# Patient Record
Sex: Female | Born: 1940
Health system: Southern US, Community
[De-identification: ages and names within clinical notes are randomized; demographics above are authoritative.]

## PROBLEM LIST (undated history)

## (undated) DIAGNOSIS — Z8709 Personal history of other diseases of the respiratory system: Secondary | ICD-10-CM

## (undated) DIAGNOSIS — M51369 Other intervertebral disc degeneration, lumbar region without mention of lumbar back pain or lower extremity pain: Secondary | ICD-10-CM

## (undated) DIAGNOSIS — M4125 Other idiopathic scoliosis, thoracolumbar region: Secondary | ICD-10-CM

## (undated) DIAGNOSIS — K219 Gastro-esophageal reflux disease without esophagitis: Secondary | ICD-10-CM

## (undated) DIAGNOSIS — M810 Age-related osteoporosis without current pathological fracture: Secondary | ICD-10-CM

## (undated) DIAGNOSIS — E785 Hyperlipidemia, unspecified: Secondary | ICD-10-CM

## (undated) DIAGNOSIS — M869 Osteomyelitis, unspecified: Secondary | ICD-10-CM

## (undated) DIAGNOSIS — I10 Essential (primary) hypertension: Secondary | ICD-10-CM

## (undated) DIAGNOSIS — M5136 Other intervertebral disc degeneration, lumbar region: Secondary | ICD-10-CM

## (undated) HISTORY — DX: Essential (primary) hypertension: I10

## (undated) HISTORY — DX: Other intervertebral disc degeneration, lumbar region: M51.36

## (undated) HISTORY — PX: ABDOMINAL HYSTERECTOMY: SHX81

## (undated) HISTORY — DX: Osteomyelitis, unspecified: M86.9

## (undated) HISTORY — DX: Other intervertebral disc degeneration, lumbar region without mention of lumbar back pain or lower extremity pain: M51.369

## (undated) HISTORY — DX: Gastro-esophageal reflux disease without esophagitis: K21.9

## (undated) HISTORY — DX: Age-related osteoporosis without current pathological fracture: M81.0

## (undated) HISTORY — DX: Hyperlipidemia, unspecified: E78.5

## (undated) HISTORY — PX: JOINT REPLACEMENT: SHX530

## (undated) HISTORY — PX: CHOLECYSTECTOMY: SHX55

## (undated) HISTORY — DX: Other idiopathic scoliosis, thoracolumbar region: M41.25

---
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
## 1999-05-11 ENCOUNTER — Other Ambulatory Visit: Admission: RE | Admit: 1999-05-11 | Discharge: 1999-05-11 | Payer: Self-pay | Admitting: Obstetrics and Gynecology

## 1999-10-26 ENCOUNTER — Ambulatory Visit (HOSPITAL_COMMUNITY): Admission: RE | Admit: 1999-10-26 | Discharge: 1999-10-26 | Payer: Self-pay | Admitting: Gastroenterology

## 1999-12-06 ENCOUNTER — Encounter: Payer: Self-pay | Admitting: Gastroenterology

## 1999-12-06 ENCOUNTER — Encounter: Admission: RE | Admit: 1999-12-06 | Discharge: 1999-12-06 | Payer: Self-pay | Admitting: Gastroenterology

## 2000-07-09 ENCOUNTER — Ambulatory Visit (HOSPITAL_COMMUNITY): Admission: RE | Admit: 2000-07-09 | Discharge: 2000-07-09 | Payer: Self-pay | Admitting: Gastroenterology

## 2000-07-09 ENCOUNTER — Encounter: Payer: Self-pay | Admitting: Gastroenterology

## 2000-07-15 ENCOUNTER — Ambulatory Visit (HOSPITAL_COMMUNITY): Admission: RE | Admit: 2000-07-15 | Discharge: 2000-07-15 | Payer: Self-pay | Admitting: Gastroenterology

## 2000-09-22 ENCOUNTER — Other Ambulatory Visit: Admission: RE | Admit: 2000-09-22 | Discharge: 2000-09-22 | Payer: Self-pay | Admitting: Obstetrics and Gynecology

## 2000-10-28 ENCOUNTER — Encounter (INDEPENDENT_AMBULATORY_CARE_PROVIDER_SITE_OTHER): Payer: Self-pay

## 2000-10-28 ENCOUNTER — Encounter: Payer: Self-pay | Admitting: General Surgery

## 2000-10-29 ENCOUNTER — Inpatient Hospital Stay (HOSPITAL_COMMUNITY): Admission: RE | Admit: 2000-10-29 | Discharge: 2000-10-30 | Payer: Self-pay | Admitting: General Surgery

## 2001-11-09 ENCOUNTER — Encounter: Payer: Self-pay | Admitting: Emergency Medicine

## 2001-11-09 ENCOUNTER — Emergency Department (HOSPITAL_COMMUNITY): Admission: EM | Admit: 2001-11-09 | Discharge: 2001-11-09 | Payer: Self-pay | Admitting: Emergency Medicine

## 2002-02-22 ENCOUNTER — Encounter: Admission: RE | Admit: 2002-02-22 | Discharge: 2002-02-22 | Payer: Self-pay | Admitting: Family Medicine

## 2002-02-22 ENCOUNTER — Encounter: Payer: Self-pay | Admitting: Family Medicine

## 2004-05-07 ENCOUNTER — Encounter (INDEPENDENT_AMBULATORY_CARE_PROVIDER_SITE_OTHER): Payer: Self-pay | Admitting: *Deleted

## 2004-05-07 ENCOUNTER — Ambulatory Visit (HOSPITAL_COMMUNITY): Admission: RE | Admit: 2004-05-07 | Discharge: 2004-05-07 | Payer: Self-pay | Admitting: Gastroenterology

## 2004-12-18 ENCOUNTER — Emergency Department (HOSPITAL_COMMUNITY): Admission: EM | Admit: 2004-12-18 | Discharge: 2004-12-18 | Payer: Self-pay | Admitting: Emergency Medicine

## 2004-12-19 ENCOUNTER — Ambulatory Visit (HOSPITAL_COMMUNITY): Admission: RE | Admit: 2004-12-19 | Discharge: 2004-12-19 | Payer: Self-pay | Admitting: Orthopedic Surgery

## 2004-12-19 ENCOUNTER — Ambulatory Visit (HOSPITAL_BASED_OUTPATIENT_CLINIC_OR_DEPARTMENT_OTHER): Admission: RE | Admit: 2004-12-19 | Discharge: 2004-12-19 | Payer: Self-pay | Admitting: Orthopedic Surgery

## 2005-09-17 ENCOUNTER — Encounter: Admission: RE | Admit: 2005-09-17 | Discharge: 2005-09-17 | Payer: Self-pay | Admitting: General Practice

## 2005-10-11 ENCOUNTER — Encounter: Admission: RE | Admit: 2005-10-11 | Discharge: 2005-10-11 | Payer: Self-pay | Admitting: Family Medicine

## 2007-04-20 ENCOUNTER — Encounter: Admission: RE | Admit: 2007-04-20 | Discharge: 2007-04-20 | Payer: Self-pay | Admitting: Family Medicine

## 2007-10-11 ENCOUNTER — Emergency Department (HOSPITAL_COMMUNITY): Admission: EM | Admit: 2007-10-11 | Discharge: 2007-10-11 | Payer: Self-pay | Admitting: Family Medicine

## 2008-08-05 ENCOUNTER — Encounter: Admission: RE | Admit: 2008-08-05 | Discharge: 2008-08-05 | Payer: Self-pay | Admitting: Sports Medicine

## 2009-01-14 ENCOUNTER — Emergency Department (HOSPITAL_COMMUNITY): Admission: EM | Admit: 2009-01-14 | Discharge: 2009-01-16 | Payer: Self-pay | Admitting: Family Medicine

## 2009-01-14 ENCOUNTER — Inpatient Hospital Stay (HOSPITAL_COMMUNITY): Admission: EM | Admit: 2009-01-14 | Discharge: 2009-01-16 | Payer: Self-pay | Admitting: Emergency Medicine

## 2009-01-16 ENCOUNTER — Ambulatory Visit: Payer: Self-pay | Admitting: Vascular Surgery

## 2009-01-16 ENCOUNTER — Encounter (INDEPENDENT_AMBULATORY_CARE_PROVIDER_SITE_OTHER): Payer: Self-pay | Admitting: Internal Medicine

## 2009-01-17 ENCOUNTER — Encounter: Admission: RE | Admit: 2009-01-17 | Discharge: 2009-01-17 | Payer: Self-pay | Admitting: Gastroenterology

## 2010-01-01 ENCOUNTER — Inpatient Hospital Stay (HOSPITAL_COMMUNITY): Admission: RE | Admit: 2010-01-01 | Discharge: 2010-01-03 | Payer: Self-pay | Admitting: Orthopedic Surgery

## 2010-01-03 ENCOUNTER — Encounter (INDEPENDENT_AMBULATORY_CARE_PROVIDER_SITE_OTHER): Payer: Self-pay | Admitting: Psychiatry

## 2010-01-03 ENCOUNTER — Ambulatory Visit: Payer: Self-pay | Admitting: Vascular Surgery

## 2010-06-13 LAB — CBC
HCT: 30.6 % — ABNORMAL LOW (ref 36.0–46.0)
Hemoglobin: 10.7 g/dL — ABNORMAL LOW (ref 12.0–15.0)
MCH: 33.8 pg (ref 26.0–34.0)
MCHC: 33 g/dL (ref 30.0–36.0)
MCV: 102.3 fL — ABNORMAL HIGH (ref 78.0–100.0)
Platelets: 147 10*3/uL — ABNORMAL LOW (ref 150–400)
RBC: 2.99 MIL/uL — ABNORMAL LOW (ref 3.87–5.11)
RBC: 3.18 MIL/uL — ABNORMAL LOW (ref 3.87–5.11)
RDW: 12.7 % (ref 11.5–15.5)
RDW: 12.7 % (ref 11.5–15.5)
WBC: 8.5 10*3/uL (ref 4.0–10.5)

## 2010-06-13 LAB — URINALYSIS, MICROSCOPIC ONLY
Glucose, UA: NEGATIVE mg/dL
Nitrite: NEGATIVE
pH: 5.5 (ref 5.0–8.0)

## 2010-06-13 LAB — BASIC METABOLIC PANEL
BUN: 7 mg/dL (ref 6–23)
Calcium: 7.9 mg/dL — ABNORMAL LOW (ref 8.4–10.5)
Chloride: 106 mEq/L (ref 96–112)
Creatinine, Ser: 0.56 mg/dL (ref 0.4–1.2)
Creatinine, Ser: 0.59 mg/dL (ref 0.4–1.2)
GFR calc non Af Amer: >60 mL/min (ref >60)
Potassium: 3.7 mEq/L (ref 3.5–5.1)

## 2010-06-13 LAB — URINE CULTURE: Culture: NO GROWTH

## 2010-06-13 LAB — HM COLONOSCOPY

## 2010-06-14 LAB — APTT: aPTT: 32 seconds (ref 24–37)

## 2010-06-14 LAB — CBC
Hemoglobin: 14.6 g/dL (ref 12.0–15.0)
MCH: 34.4 pg — ABNORMAL HIGH (ref 26.0–34.0)
RBC: 4.25 MIL/uL (ref 3.87–5.11)
RDW: 12.8 % (ref 11.5–15.5)

## 2010-06-14 LAB — CROSSMATCH: ABO/RH(D): A NEG

## 2010-06-14 LAB — DIFFERENTIAL
Basophils Absolute: 0 10*3/uL (ref 0.0–0.1)
Eosinophils Absolute: 0.2 10*3/uL (ref 0.0–0.7)
Eosinophils Relative: 2 % (ref 0–5)
Lymphocytes Relative: 12 % (ref 12–46)
Lymphs Abs: 1.2 10*3/uL (ref 0.7–4.0)

## 2010-06-14 LAB — URINALYSIS, ROUTINE W REFLEX MICROSCOPIC
Glucose, UA: NEGATIVE mg/dL
Ketones, ur: NEGATIVE mg/dL
Nitrite: NEGATIVE
Specific Gravity, Urine: 1.014 (ref 1.005–1.030)
Urobilinogen, UA: 0.2 mg/dL (ref 0.0–1.0)
pH: 5.5 (ref 5.0–8.0)

## 2010-06-14 LAB — URINE CULTURE
Colony Count: 35000
Culture  Setup Time: 201109271530

## 2010-06-14 LAB — COMPREHENSIVE METABOLIC PANEL
Albumin: 3.8 g/dL (ref 3.5–5.2)
Alkaline Phosphatase: 85 U/L (ref 39–117)
CO2: 28 mEq/L (ref 19–32)
Chloride: 105 mEq/L (ref 96–112)
GFR calc Af Amer: >60 mL/min (ref >60)
GFR calc non Af Amer: >60 mL/min (ref >60)
Sodium: 142 mEq/L (ref 135–145)

## 2010-06-14 LAB — PROTIME-INR: INR: 0.94 (ref 0.00–1.49)

## 2010-07-05 LAB — DIFFERENTIAL
Eosinophils Relative: 0 % (ref 0–5)
Lymphocytes Relative: 12 % (ref 12–46)
Lymphs Abs: 1.1 10*3/uL (ref 0.7–4.0)
Monocytes Relative: 6 % (ref 3–12)

## 2010-07-05 LAB — COMPREHENSIVE METABOLIC PANEL
AST: 22 U/L (ref 0–37)
CO2: 24 mEq/L (ref 19–32)
Calcium: 9.1 mg/dL (ref 8.4–10.5)
Creatinine, Ser: 0.63 mg/dL (ref 0.4–1.2)
GFR calc Af Amer: >60 mL/min (ref >60)
GFR calc non Af Amer: >60 mL/min (ref >60)
Sodium: 141 mEq/L (ref 135–145)
Total Protein: 6.5 g/dL (ref 6.0–8.3)

## 2010-07-05 LAB — TSH: TSH: 2.999 u[IU]/mL (ref 0.350–4.500)

## 2010-07-05 LAB — BASIC METABOLIC PANEL
GFR calc Af Amer: >60 mL/min (ref >60)
GFR calc non Af Amer: >60 mL/min (ref >60)
Potassium: 3.9 mEq/L (ref 3.5–5.1)
Sodium: 141 mEq/L (ref 135–145)

## 2010-07-05 LAB — CBC
HCT: 35.5 % — ABNORMAL LOW (ref 36.0–46.0)
Hemoglobin: 12.2 g/dL (ref 12.0–15.0)
MCHC: 33.9 g/dL (ref 30.0–36.0)
MCV: 101.5 fL — ABNORMAL HIGH (ref 78.0–100.0)
Platelets: 139 10*3/uL — ABNORMAL LOW (ref 150–400)
Platelets: 160 10*3/uL (ref 150–400)
RBC: 3.48 MIL/uL — ABNORMAL LOW (ref 3.87–5.11)
RBC: 4.04 MIL/uL (ref 3.87–5.11)
RDW: 12.9 % (ref 11.5–15.5)
WBC: 6 10*3/uL (ref 4.0–10.5)

## 2010-07-05 LAB — URINALYSIS, ROUTINE W REFLEX MICROSCOPIC
Ketones, ur: NEGATIVE mg/dL
Nitrite: NEGATIVE
Protein, ur: NEGATIVE mg/dL

## 2010-07-05 LAB — POCT CARDIAC MARKERS
Myoglobin, poc: 51.5 ng/mL (ref 12–200)
Troponin i, poc: <0.05 ng/mL (ref 0.00–0.09)

## 2010-07-05 LAB — FOLATE: Folate: >20 ng/mL

## 2010-08-14 NOTE — Assessment & Plan Note (Signed)
[facility]                                 ON-CALL NOTE   SKIE, VITRANO                         MRN:          [id number]  DATE:01/14/2009                            DOB:          [birth year]    TIME:  3:15 p.m.   PHYSICIAN:  Anselmo Rod, M.D.   Telephone number [phone].  Ms. Christina Lyons calls today saying she had  constipation which was then associated with crampy lower abdominal pain.  She had the urge to have a bowel movement and while sitting on the  commode and straining to have a bowel movement she passed out.  She  states she was probably out for about 5 minutes.  She had diarrhea when  she woke up.  She did not report any injury or head trauma.  She did not  lose urinary continence.  She states she had a similar episode occurring  about a year ago and she was told it was a vasovagal reaction.  She had  recently been placed on an antibiotic and a probiotic for intermittent  crampy lower abdominal pain by Dr. Loreta Ave.  I advised her to go to the  nearest urgent care or emergency room for further evaluation and to have  someone drive her there.  She has an appointment for followup with Dr.  Loreta Ave on Tuesday.     Venita Lick. Russella Dar, MD, [facility]  Electronically Signed    MTS/MedQ  DD: 01/14/2009  DT: 01/14/2009  Job #: [id number]   cc:   Anselmo Rod, M.D.

## 2010-08-17 NOTE — Op Note (Signed)
Christina Lyons, Christina Lyons                ACCOUNT NO.:  [id number]   MEDICAL RECORD NO.:  [id number]          PATIENT TYPE:  AMB   LOCATION:  DSC                          FACILITY:  [facility]   PHYSICIAN:  Cindee Salt, M.D.       DATE OF BIRTH:  [birth year]   DATE OF PROCEDURE:  12/19/2004  DATE OF DISCHARGE:                                 OPERATIVE REPORT   PREOPERATIVE DIAGNOSIS:  Laceration, left hand.   POSTOPERATIVE DIAGNOSIS:  Laceration, left hand.   OPERATION:  Repair of digital nerves, left ring finger, with end-to-side on  the radial and on the ulnar digital nerve, left hand, left ring finger.   SURGEON:  Cindee Salt, M.D.   ASSISTANT:  Alfredo Bach.   ANESTHESIA:  General.   HISTORY:  The patient is a 70 year old female who suffered a fall, striking  her hand on a knob on a cabinet, suffering a laceration to the palmar radial  aspect of the ring finger at the metacarpophalangeal joint crease.  She  complains of numbness, both radially and ulnarly.   PROCEDURE:  The patient was brought to the operating room where a general  anesthetic was carried out without difficulty.  She was prepped using  DuraPrep, supine position, left arm free.  The limb was exsanguinated with  an Esmarch bandage.  Tourniquet placed high on the arm was inflated to 250  mmHg.  The wound was opened, sutures removed.  The laceration to the digital  nerve was immediately apparent on the radial side.  The wound was extended  proximally.  The common digital nerve was identified.  No branch was easily  identified to the ring finger, radial aspect.  The nerve was easily traced  to the ulnar aspect of the middle finger.  The wound was extended proximally  to be certain that there was not a branch, as this had been an avulsion.  The entire nerve was explored back to the median nerve and no separate  branch was identified.  A significant amount of bleeding was present on the  ulnar aspect.  The digital nerve on the  ulnar side was explored and found to  be lacerated.  The stump proximally was found at the midportion of the  metacarpal.  The digital arteries were noted be intact.  The flexor tendons  were intact.  The operative microscope was brought into position.  The  entire nerve was explored along the radial side and repair performed with an  end-to-side into the common digital nerve on the radial side.  The ulnar  side was repaired end-to-end with interrupted 9-0 nylon sutures to each.  The wounds were copiously irrigated with saline, the skin then closed with  interrupted 5-0 nylon sutures.  A sterile compressive dressing and splint  with the finger flexed was  applied.  The patient tolerated the procedure well and was taken to the  recovery room for observation in satisfactory condition.   She is discharged home to return to the [facility] [place] in 1 week  on Vicodin.  ______________________________  Cindee Salt, M.D.     GK/MEDQ  D:  12/19/2004  T:  12/20/2004  Job:  [id number]

## 2010-08-17 NOTE — Procedures (Signed)
[facility]  Patient:    Christina Lyons, Christina Lyons                       MRN: [id number] Proc. Date: 10/26/99 Adm. Date:  [id number] Attending:  Charna Elizabeth CC:         Juluis Mire, M.D.                           Procedure Report  DATE OF BIRTH:  November 17,1942  REFERRING PHYSICIAN:  Juluis Mire, M.D.  PROCEDURE PERFORMED:  Esophagogastroduodenoscopy.  ENDOSCOPIST:  Anselmo Rod, M.D.  INSTRUMENT USED:  Olympus video panendoscope.  INDICATIONS:   Dysphagia and guaiac positive stool in a 70 year old white female.  Rule out peptic esophagitis, Barretts mucosa, strictures, polyps, masses, etc.  PREPROCEDURE PREPARATION:  Informed consent was procured from the patient. The patient was fasted for 8 hours prior to the procedure.  PREPROCEDURE PHYSICAL:  Patient has stable vital signs.  NECK:  Supple.  CHEST:  Clear to auscultation. S1, S2 regular.  ABDOMEN:  Soft with normal abdominal bowel sounds.  DESCRIPTION OF PROCEDURE:  The patient was placed in left lateral decubitus position and sedated with 60 mg of Demerol and 6 mg of Versed intravenously. Once the patient was adequately sedated and maintained on low-flow oxygen and continuous cardiac monitoring, the Olympus video panendoscope was advanced through the mouth piece, over the tongue into the esophagus under direct vision.  The entire esophagus appeared normal without evidence of rings, strictures, masses, lesions, esophagitis or Barretts mucosa.  The scope was then advanced into the stomach. A large hiatal hernia was seen on retroflexion in the high cardia.  No erosions, ulcerations, masses or polyps were present. The duodenal bulb and small bowel distal to the bulb up to 60 cm appeared normal.  There was no outlet obstruction.  The patient tolerated the procedure well without complications.  IMPRESSION:  Essentially normal EGD, except for large hiatal  hernia.  RECOMMENDATIONS: 1. Proceed with colonoscopy at this time. 2. Antireflux measures. 3. Avoid all nonsteroidals for now. DD:  10/26/99 TD:  10/27/99 Job: [id number]

## 2010-08-17 NOTE — Op Note (Signed)
NAME:  Christina Lyons, Christina Lyons                ACCOUNT NO.:  [id number]   MEDICAL RECORD NO.:  [id number]          PATIENT TYPE:  AMB   LOCATION:  ENDO                         FACILITY:  [facility]   PHYSICIAN:  Anselmo Rod, M.D.  DATE OF BIRTH:  1940-12-18   DATE OF PROCEDURE:  05/07/2004  DATE OF DISCHARGE:                                 OPERATIVE REPORT   PROCEDURE PERFORMED:  Colonoscopy with cold biopsies x4.   ENDOSCOPIST:  Charna Elizabeth, M.D.   INSTRUMENT USED:  Olympus video colonoscope.   INDICATION FOR PROCEDURE:  A 70 year old white female with 1/3 guaiac-  positive on routine physical.  Rule out colonic polyps, masses, etc.   PREPROCEDURE PREPARATION:  Informed consent was procured from the patient.  The patient was fasted for 8 hours prior to the procedure and prepped with a  bottle of magnesium citrate and a gallon of GoLYTELY the night prior to the  procedure.  Risks and benefits of the procedure, including a 10% miss rate  of cancer and polyps, was discussed with the patient as well.   PREPROCEDURE PHYSICAL:  Patient with stable vital signs.  NECK:  Supple.  CHEST:  Clear to auscultation.  S1, S2 regular.  ABDOMEN:  Soft with normal bowel sounds.   DESCRIPTION OF THE PROCEDURE:  The patient was placed in the left lateral  decubitus position, sedated with 100 mg of Demerol of 10 mg of Versed in  slow incremental doses.  Once the patient was adequately sedated and  maintained on low flow oxygen and continuous cardiac monitoring, the Olympus  video colonoscope was advanced from the rectum to the cecum.  The  appendicular orifice and ileocecal valve were clearly visualized and  photographed.  A small flat polyp was biopsied from 25 cm (cold biopsied  x4).  There was no evidence of diverticulosis, no other masses or polyps  were seen.  Retroflexion in the rectum revealed small internal hemorrhoids.  The patient tolerated the procedure well without complications.   IMPRESSION:  1.  Small nonbleeding internal hemorrhoids.  2.  Small flat polyp biopsied from 25 cm (cold biopsies x4).  3.  Otherwise normal colonoscopy.  4.  Some residual stool in the colon, multiple washings done.   RECOMMENDATIONS:  1.  Await pathology results.  2.  Avoid nonsteroidals including aspirin for the next 2 weeks.  3.  Outpatient followup in the next 2 weeks for repeat guaiac testing.      Further recommendations will be made at that time.      JNM/MEDQ  D:  05/07/2004  T:  05/07/2004  Job:  [id number]   cc:   Ernestina Penna, M.D.  [address] [place]  [place] [address]  Fax: [phone]   Malachi Pro. Ambrose Mantle, M.D.  [address]  [place] [address]  Fax: [phone]   Adolph Pollack, M.D.  [address], [address]  [place]  [place] [address]

## 2010-08-17 NOTE — Discharge Summary (Signed)
[facility]  Patient:    Christina Lyons, Christina Lyons                       MRN: [id number] Adm. Date:  [id number] Disc. Date: [id number] Attending:  Arlis Porta CC:         Malachi Pro. Ambrose Mantle, M.D.  Anselmo Rod, M.D.  Elvina Sidle, M.D.  Lucky Cowboy, M.D.   Discharge Summary  PRINCIPAL DISCHARGE DIAGNOSIS:  Gastroesophageal reflux disease.  SECONDARY DIAGNOSES: 1. Fourth-degree cystocele. 2. Second-degree rectocele.  PROCEDURES: 1. Laparoscopic hiatal hernia repair and Nissen fundoplication. 2. Anterior and posterior repair of fourth-degree cystocele and second-degree    rectocele, both done October 28, 2000.  REASON FOR ADMISSION:  This is a 70 year old female, who has had significant gastroesophageal reflux disease, difficult to control medically.  She has a very high score on her pH probe.  She also has a rectocele and a cystocele, and she presents for antireflux procedure and a repair of the rectocele and cystocele.  HOSPITAL COURSE:  She was admitted and underwent the above operations without complications.  Postoperatively she had an unremarkable postoperative course except for a little bit of nausea.  The nausea improved by her second postoperative day.  She was tolerating a liquid diet.  She had a Foley in and was able to be discharged.  DISPOSITION:  Discharged to home on October 30, 2000, in satisfactory condition. She will keep her Foley in and then see Dr. Ambrose Mantle in his office for a voiding trial.  She is given specific instructions.  She is given Tylox for pain and told to continue most of her home medicines except for her proton pump inhibitor and given Phenergan for nausea if needed.  She will come back to see me in two weeks and see Dr. Ambrose Mantle in approximately 3-4 days. DD:  11/14/00 TD:  11/16/00 Job: [id number]

## 2010-08-17 NOTE — Procedures (Signed)
[place]  Patient:    Christina Lyons, Christina Lyons                       MRN: [id number] Proc. Date: 10/26/99 Adm. Date:  [id number] Disc. Date: [id number] Attending:  Charna Elizabeth CC:         Juluis Mire, M.D.                           Procedure Report  DATE OF BIRTH:  [birth year]  REFERRING PHYSICIAN:  Juluis Mire, M.D.  PROCEDURE PERFORMED:  Colonoscopy.  ENDOSCOPIST:  Anselmo Rod, M.D.  INSTRUMENT USED:  Olympus video colonoscope.  INDICATIONS FOR PROCEDURE:  Guaiac positive stool in a 70 year old white female, rule out colonic polyps, masses, hemorrhoids, etc.  PREPROCEDURE PREPARATION:  Informed consent was obtained from the patient. The patient was fasted for eight hours prior to the procedure, and prepped with a bottle of magnesium citrate and a gallon of nulytely the night prior to the procedure.  PREPROCEDURE PHYSICAL EXAMINATION:  VITAL SIGNS:  Stable.  NECK:  Supple.  CHEST:  Clear to auscultation.  S1 and S2 regular.  ABDOMEN:  Soft with normal abdominal bowel sounds.  DESCRIPTION OF PROCEDURE:  The patient was placed in the left lateral decubitus position, and sedated with an additional 30 mg of Demerol and 2 mg of Versed intravenously.  Once the patient was adequately sedated and maintained on low flow oxygen and continuous cardiac monitoring, the Olympus video colonoscope was advanced from the rectum to the cecum without difficulty.  No masses, polyps, erosions, ulcerations, or diverticula were seen.  The appendiceal orifice and the ileocecal valve were clearly visualized.  The patient tolerated the procedure well without complication.  IMPRESSION:  Normal colonoscopy.  RECOMMENDATIONS:  The patient has been advised to follow up in the office for repeat guaiac testing.  Further recommendations will be made at that time. DD:  10/26/99 TD:  10/28/99 Job: [id number]

## 2010-08-17 NOTE — H&P (Signed)
[facility]  Patient:    Christina Lyons, Christina Lyons                       MRN: [id number] Adm. Date:  [id number] Attending:  Arlis Porta CC:         Anselmo Rod, M.D.  Malachi Pro. Ambrose Mantle, M.D.  Elvina Sidle, M.D.  Lucky Cowboy, M.D.   History and Physical  REASON FOR ADMISSION:  Elective laparoscopic Nissan fundal plication and anterior and posterior repair for cystocele.  HISTORY OF PRESENT ILLNESS:   Ms. Neyer is a 70 year old female who was sent to me by Dr. Loreta Ave. She has been having burning substernal chest pain and initially was controlled easily with medication. She was having more difficulty with control of her other documented gastroesophageal reflux and was having a globus sensation in her throat and some nasal problems. She was seen by Dr. Lucky Cowboy who examined her, did a fiberoptic laryngoscopy and felt that her findings were consistent with supraesophageal manifestations of gastroesophageal reflux disease. A 24-hour pH probe study was done which demonstrated a DeMeester score of 57.3 which is significantly elevated. Manometry was done and demonstrates normal esophageal motility and a normal lower esophageal sphincter pressure. She had an upper GI which demonstrated a small hiatal hernia. A gastric emptying study was normal at two hours. Upper endoscopy performed and no malignant changes were noted. She was taking Nexium twice a day, and this has helped her substernal chest pain, but she continues to have hoarseness. She has no gas bloating or food intolerance. I initially had a long discussion with her about treatment of gastroesophageal reflux disease including the fundal plication. At first she was hesitant, but when I spoke with her again, she wanted to proceed.  PAST MEDICAL HISTORY: 1. Hiatal hernia with gastroesophageal reflux disease. 2. Cystocele. 3. Osteomyelitis of the right lower extremity. 4. Osteoarthritis. 5.  Sinusitis.  PREVIOUS OPERATIONS: 1. Cholecystectomy. 2. Hysterectomy. 3. Right rotator cuff repair. 4. Surgery on her right leg for osteomyelitis five times, one of which    included a bone transplant.  ALLERGIES:  SULFA drugs.  MEDICATIONS:  Relafen for arthritis, Nexium b.i.d., vitamin E, vitamin D, and calcium.  SOCIAL HISTORY:   She is married. She denies the current use of tobacco or alcohol.  FAMILY HISTORY:  Positive for father who died from heart disease.  REVIEW OF SYSTEMS:  Notable for the cystocele and she has been seen by Dr. Ambrose Mantle for this and there is a planned repair following her fundal plication. She does have the hoarseness as explained above.  PHYSICAL EXAMINATION:  GENERAL:  A slightly obese female in no acute distress, pleasant and cooperative.  VITAL SIGNS:  Temperature 97.6, blood pressure 150/100, pulse 75, respiratory rate 18. Height is 5 feet 3 inches tall. Weight 203 pounds. Her room air oxygen saturation is 95%.  SKIN:  Warm and dry without jaundice.  HEENT:  Eyes: Extraocular motions intact.  No scleral icterus present.  NECK:  Supple without obvious palpable masses.  CARDIOVASCULAR:  Heart demonstrates regular rate and rhythm with no murmur heard.  RESPIRATORY:  Breath sounds equal and clear and respirations unlabored.  ABDOMEN:  Soft with a right paramedian scar present. No palpable masses noted. No organomegaly noted.  EXTREMITIES:  PAS hose are on at this time. There is trace pedal edema present. They are pink and warm.  NEUROLOGIC:  She is alert and oriented, has normal motor strength present.  IMPRESSION: 1. Medically refractory gastroesophageal reflux disease with small hiatal    hernia. 2. Cystocele and stress urinary incontinence.  PLAN: 1. Laparoscopic Nissan fundal plication. I have previously explained the    procedure and the risks to her extensively. The risks include, but are not    limited to, bleeding,  infection, esophageal or gastric injury, splenic or    hepatic injury, the risks of general anesthesia. We also discussed    side effects including gas bloating, dysphagia, and excessive flatulence as    well as potential change in bowel habits. She seems to understand these and    wants to proceed. 2. Dr. Ambrose Mantle will proceed after me with a repair of the cystocele. DD:  10/28/00 TD:  10/28/00 Job: [id number]

## 2010-08-17 NOTE — Op Note (Signed)
[facility]  Patient:    Christina Lyons, Christina Lyons                       MRN: [id number] Proc. Date: 10/28/00 Adm. Date:  [id number] Attending:  Arlis Porta CC:         Adolph Pollack, M.D.   Operative Report  PREOPERATIVE DIAGNOSIS:  Fourth degree cystocele and second degree rectocele.  POSTOPERATIVE DIAGNOSIS:  Fourth degree cystocele and second degree rectocele.  OPERATION:  A&P repair.  SURGEON:  Malachi Pro. Ambrose Mantle, M.D.  ASSISTANT:  Alvino Chapel, M.D.  ANESTHESIA:  General.  INDICATIONS:  The patient had already undergone a Nissen fundoplication by Dr. Abbey Chatters.  DESCRIPTION OF PROCEDURE:  After the procedure was finished, she was placed in the lithotomy position.  The vulva, vagina, urethra, and perineum were prepped with Betadine solution, and draped as a sterile field.  There was an obvious fourth degree cystocele and second degree rectocele with fair support at the vaginal cuff.  Allis clamps were placed at the cuff.  The cuff was incised transversely and then an incision was made in the vaginal mucosa, all the way to the urethral meatus.  The huge cystocele was developed and then reduced with multiple interrupted sutures of 0 Vicryl.  A large amount of redundant vaginal mucosa was cut away and the vagina was then reunited in the midline using interrupted figure-of-eight sutures of 0 Vicryl.  A transverse incision was made across the posterior fourchette.  The posterior vaginal mucosa was then undermined all the way to the vaginal cuff.  The rectocele was developed. I did a rectal exam to ensure that there was no rectal injury.  I developed the rectocele, imbricated it with several interrupted sutures of 0 Vicryl, cut away the redundant vaginal mucosa, and reunited the vaginal mucosa in the midline using multiple interrupted figure-of-eight sutures of 0 Vicryl.  The perineum was then rebuilt with a running suture of 3-0  Vicryl.  The patient seemed to tolerate the procedure well.  I reinspected all suture sites.  There was no significant bleeding.  I placed a 2 inch Iodoform pack into the vagina.  There was relatively good support of the vaginal cuff and obliteration of the cystocele and rectocele.  The patient was then returned to recovery in satisfactory condition.  Sponge and needle counts correct. DD:  10/28/00 TD:  10/28/00 Job: [id number]

## 2010-08-17 NOTE — Op Note (Signed)
[facility]  Patient:    Christina Lyons, Christina Lyons                       MRN: [id number] Proc. Date: 10/28/00 Adm. Date:  [id number] Attending:  Arlis Porta CC:         Anselmo Rod, M.D.  Lucky Cowboy, M.D.  Elvina Sidle, M.D.  Malachi Pro. Ambrose Mantle, M.D.   Operative Report  PREOPERATIVE DIAGNOSES: 1. Hiatal hernia. 2. Gastroesophageal reflux disease.  POSTOPERATIVE DIAGNOSES: 1. Hiatal hernia. 2. Gastroesophageal reflux disease.  PROCEDURE:  Laparoscopic repair of hiatal hernia and Nissen fundoplication (over a size 50 dilator)  SURGEON:  Adolph Pollack, M.D.  ASSISTANTS: 1. Velora Heckler, M.D. 2. Catalina Lunger, M.D.  ANESTHESIA:  General.  INDICATIONS:  Ms. Rhine is a 70 year old female with gastroesophageal reflux disease on twice a day proton pump inhibitors.  This has controlled the substernal pain she has had, but she continues to have the supraesophageal manifestations.  She has an abnormal pH study, a normal esophageal motility on manometry.  Upper GI demonstrates a small hiatal hernia.  She also has a cystocele.  She presents now for laparoscopic repair of a hiatal hernia and a Nissen fundoplication.  Dr. Ambrose Mantle is to follow with the cystocele repair.  TECHNIQUE:  She was placed supine on the operating table, and a general anesthetic was administered.  Her abdomen was sterilely prepped and draped. Local anesthetic consisting of 0.5% Marcaine was infiltrated in the supraumbilical region and a small incision made in the supraumbilical region through the skin and subcutaneous tissue.  The fascia was identified and a 1 cm incision was made in the anterior fascia.  Posterior fascia was grasped and a 1 cm incision made in it.  The peritoneal cavity was entered bluntly and under direct vision.  A pursestring suture of 0 Vicryl was placed around the fascial edges.  A Hasson trocar was introduced to the peritoneal cavity  and the pneumoperitoneum created by insufflation of CO2 gas.  The 30 degree laparoscope was introduced, and she had a previous right paramedian incision for a cholecystectomy, and filmy adhesions between the omentum and the anterior abdominal wall were noted.  Under direct vision, a 5 mm trocar was placed in the left upper quadrant and a 5 mm trocar placed in the right mid abdomen.  Using sharp dissection, the adhesions were taken down. I subsequently inserted a self-retaining liver retractor and used this to retract the left lobe of the liver anteriorly and superiorly, exposing the gastroesophageal junction.  I then placed a 5 mm trocar through her right upper quadrant incision and a 10 mm trocar through the right upper quadrant incision.  The gastroesophageal junction was identified and grasped gently and retracted toward the left.  The filmy gastrohepatic ligament area was incised with the harmonic scalpel.  A re-placed left hepatic artery was noted and preserved.  I identified the right crus and incised the peritoneum anterior to the esophagus.  Using careful blunt dissection, I separated the esophagus from the right crus.  There appeared to be some inflammatory changes here.  I then began creating the retroesophageal window by identifying the retroesophageal fat pad.  The posterior vagus nerve was noted, and it was left adherent to the esophagus.  Next, I approached the fundus of the stomach and chose a position approximately one-third of the way down.  I divided the short gastric vessels all the way up through the  cardia and released the angle of His using the harmonic scalpel.  This allowed for plenty of fundus.  I then dissected the left crus away from the esophagus and created a large retroesophageal window.  I noted the hiatal hernia, which was small, and closed it with a single suture that was a size 0 and nonabsorbable.  I then grasped the fundus and brought it through the  retroesophageal window, creating a 360 degree wrap.  The shoe shine maneuver was performed.  The wrap was under no tension.  Next, I had the anesthesiologist pass a size 50 dilator, but it appeared to get stuck posteriorly.  I went ahead and cut the suture that closed the hiatal hernia, and the bougie passed easily.  I then performed a 360 degree wrap with three size 0 sutures.  The wrap measured 2 cm.  The first two bites of the wrap include both the left and the right leaves as well as of a small bite of esophagus.  I then removed the dilator and noted that the wrap was floppy, and it lay at the 10 oclock position on the esophagus.  I subsequently reidentified the hiatal hernia and closed it with a single size 0 suture.  I then anchored the right leaf of the wrap to the right crus with a single size 0 suture.  I inspected the area, and the stomach appeared viable, and the wrap was under no tension.  There was no active bleeding at the time.  I subsequently removed all of the trocars and released the pneumoperitoneum.  The supraumbilical fascial defect was then closed by tightening up and tying down a pursestring suture.  The skin incisions were then closed with 4-0 Monocryl subcuticular stitches followed by Steri-Strips and sterile dressings.  She tolerated the procedure well without any apparent complications. Dr. Ambrose Mantle was going to proceed with the anterior and posterior repair for her cystocele. DD:  10/28/00 TD:  10/28/00 Job: [id number]

## 2012-07-27 ENCOUNTER — Other Ambulatory Visit: Payer: Self-pay | Admitting: Family Medicine

## 2012-08-07 ENCOUNTER — Telehealth: Payer: Self-pay | Admitting: Physician Assistant

## 2012-08-14 NOTE — Telephone Encounter (Signed)
Called pt and she stated she had allergies thought may be seasonal I told her she can take ovet the counter meds and if she feels she needs to be seen we can make her appt

## 2012-08-26 ENCOUNTER — Other Ambulatory Visit: Payer: Self-pay | Admitting: Family Medicine

## 2012-08-28 ENCOUNTER — Telehealth: Payer: Self-pay | Admitting: Family Medicine

## 2012-08-28 MED ORDER — LORAZEPAM 1 MG PO TABS: 1.0000 mg | ORAL_TABLET | Freq: Two times a day (BID) | ORAL | Status: DC

## 2012-08-28 NOTE — Telephone Encounter (Signed)
?   OK to Refill  

## 2012-08-28 NOTE — Telephone Encounter (Signed)
Rx Refilled  

## 2012-08-28 NOTE — Telephone Encounter (Signed)
Ok to refill 

## 2012-10-03 ENCOUNTER — Other Ambulatory Visit: Payer: Self-pay | Admitting: Family Medicine

## 2012-10-05 ENCOUNTER — Telehealth: Payer: Self-pay | Admitting: Family Medicine

## 2012-10-05 MED ORDER — HYDROCODONE-ACETAMINOPHEN 5-325 MG PO TABS: 1.0000 | ORAL_TABLET | Freq: Four times a day (QID) | ORAL | Status: DC | PRN

## 2012-10-05 NOTE — Telephone Encounter (Signed)
?  ok to refill °

## 2012-10-05 NOTE — Telephone Encounter (Signed)
Ok to refill with 2 refills 

## 2012-10-05 NOTE — Telephone Encounter (Signed)
Med c/o for different strength

## 2012-10-31 ENCOUNTER — Other Ambulatory Visit: Payer: Self-pay | Admitting: Family Medicine

## 2012-11-02 ENCOUNTER — Encounter: Payer: Self-pay | Admitting: Family Medicine

## 2012-11-02 NOTE — Telephone Encounter (Signed)
Medication refill for one time only.  Patient needs to be seen.  Letter sent for patient to call and schedule 

## 2012-11-12 ENCOUNTER — Encounter: Payer: Self-pay | Admitting: Family Medicine

## 2012-11-12 ENCOUNTER — Ambulatory Visit (INDEPENDENT_AMBULATORY_CARE_PROVIDER_SITE_OTHER): Payer: Medicare Other | Admitting: Family Medicine

## 2012-11-12 VITALS — BP 128/74 | HR 74 | Temp 98.0°F | Resp 16 | Wt 202.0 lb

## 2012-11-12 DIAGNOSIS — M899 Disorder of bone, unspecified: Secondary | ICD-10-CM

## 2012-11-12 DIAGNOSIS — M5136 Other intervertebral disc degeneration, lumbar region: Secondary | ICD-10-CM | POA: Insufficient documentation

## 2012-11-12 DIAGNOSIS — E785 Hyperlipidemia, unspecified: Secondary | ICD-10-CM | POA: Insufficient documentation

## 2012-11-12 DIAGNOSIS — I10 Essential (primary) hypertension: Secondary | ICD-10-CM | POA: Insufficient documentation

## 2012-11-12 DIAGNOSIS — M8580 Other specified disorders of bone density and structure, unspecified site: Secondary | ICD-10-CM

## 2012-11-12 DIAGNOSIS — M810 Age-related osteoporosis without current pathological fracture: Secondary | ICD-10-CM | POA: Insufficient documentation

## 2012-11-12 DIAGNOSIS — M4125 Other idiopathic scoliosis, thoracolumbar region: Secondary | ICD-10-CM | POA: Insufficient documentation

## 2012-11-12 LAB — CBC WITH DIFFERENTIAL/PLATELET
Basophils Absolute: 0 10*3/uL (ref 0.0–0.1)
Eosinophils Absolute: 0.2 10*3/uL (ref 0.0–0.7)
Eosinophils Relative: 3 % (ref 0–5)
MCH: 33.7 pg (ref 26.0–34.0)
MCV: 99.3 fL (ref 78.0–100.0)
Neutrophils Relative %: 61 % (ref 43–77)
Platelets: 201 10*3/uL (ref 150–400)
RBC: 4.1 MIL/uL (ref 3.87–5.11)
RDW: 13.2 % (ref 11.5–15.5)
WBC: 5.5 10*3/uL (ref 4.0–10.5)

## 2012-11-12 LAB — COMPLETE METABOLIC PANEL WITH GFR
ALT: 18 U/L (ref 0–35)
BUN: 16 mg/dL (ref 6–23)
CO2: 30 mEq/L (ref 19–32)
Calcium: 9.2 mg/dL (ref 8.4–10.5)
Chloride: 104 mEq/L (ref 96–112)
Creat: 0.73 mg/dL (ref 0.50–1.10)
GFR, Est African American: >89 mL/min

## 2012-11-12 LAB — LIPID PANEL
LDL Cholesterol: 131 mg/dL — ABNORMAL HIGH (ref 0–99)
Triglycerides: 78 mg/dL (ref <150)

## 2012-11-12 NOTE — Progress Notes (Signed)
Subjective:    Patient ID: Christina Lyons, female    DOB: 20-Feb-1941, 72 y.o.   MRN: [id number]  HPI  Patient is here today for followup of her hyperlipidemia. Office visit 6 months ago, her Lipitor was discontinued because her cholesterol panel was excellent. She is here today to recheck her cholesterol panel off the Lipitor. She is complaining of some edema in both feet. Her right leg swells worse than her left leg. She has a history of chronic venous insufficiency with numerous small varicosities around both ankles.  The right leg swells worse than the left leg due to significant surgery on the right limb due to the osteomyelitis.  She denies shortness of breath, dyspnea on exertion, or chest pain. Her weight is actually down 7 pounds since her last office visit. She also complains of some cramps in her lower legs at night. She has attributed this to the evista.  She stopped the medication 2 months ago and the cramps stopped. 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 Past Medical History  Diagnosis Date  . Osteomyelitis 1946    both legs  . Hyperlipidemia   . Hypertension   . Other idiopathic scoliosis, thoracolumbar region   . Osteopenia   . GERD (gastroesophageal reflux disease)   . DDD (degenerative disc disease), lumbar    Past Surgical History  Procedure Laterality Date  . Abdominal hysterectomy    . Cholecystectomy     Current Outpatient Prescriptions on File Prior to Visit  Medication Sig Dispense Refill  . HYDROcodone-acetaminophen (NORCO/VICODIN) 5-325 MG per tablet Take 1 tablet by mouth every 6 (six) hours as needed for pain.  60 tablet  2  . lisinopril (PRINIVIL,ZESTRIL) 10 MG tablet TAKE 1 TABLET BY MOUTH DAILY  30 tablet  0  . LORazepam (ATIVAN) 1 MG tablet TAKE 1 TABLET TWICE A DAY  60 tablet  0  . raloxifene (EVISTA) 60 MG tablet TAKE 1 TABLET BY MOUTH EVERY DAY  30 tablet  5   No current facility-administered medications  on file prior to visit.   Allergies  Allergen Reactions  . Sulfa Antibiotics Hives   History   Social History  . Marital Status: Widowed    Spouse Name: N/A    Number of Children: N/A  . Years of Education: N/A   Occupational History  . Not on file.   Social History Main Topics  . Smoking status: Former Games developer  . Smokeless tobacco: Not on file  . Alcohol Use: No  . Drug Use: No  . Sexual Activity: Not on file   Other Topics Concern  . Not on file   Social History Narrative  . No narrative on file     Review of Systems  All other systems reviewed and are negative.       Objective:   Physical Exam  Vitals reviewed. Neck: Neck supple. No JVD present. No thyromegaly present.  Cardiovascular: Normal rate, regular rhythm, normal heart sounds and intact distal pulses.  Exam reveals no gallop and no friction rub.   No murmur heard. Pulmonary/Chest: Effort normal and breath sounds normal. No respiratory distress. She has no wheezes. She has no rales. She exhibits no tenderness.  Abdominal: Soft. Bowel sounds are normal. She exhibits no distension. There is no tenderness. There is no rebound.  Musculoskeletal: She exhibits edema.  Lymphadenopathy:    She has no cervical adenopathy.   she has  trace bipedal edema to just above the ankles. She has small varicosities around the ankles. She has a significant scar on her right shin        Assessment & Plan:  1. HLD (hyperlipidemia) Recheck fasting lipid panel today, goal LDL is less than 130. - COMPLETE METABOLIC PANEL WITH GFR - Lipid panel - CBC with Differential  2. Hypertension Blood pressure is well controlled. Continue lisinopril 10 mg by mouth daily. Check CMP to rule out hypocalcemia or hypokalemia as the cause of her cramps. I feel the cramps are most likely due to her being on her feet more with work.  3. Osteopenia Discussed with the patient and she would like to resume the evista

## 2012-11-13 ENCOUNTER — Encounter: Payer: Self-pay | Admitting: Family Medicine

## 2012-11-17 ENCOUNTER — Telehealth: Payer: Self-pay | Admitting: Family Medicine

## 2012-11-17 NOTE — Telephone Encounter (Signed)
Patient aware of blood work results.

## 2012-12-01 ENCOUNTER — Other Ambulatory Visit: Payer: Self-pay | Admitting: Family Medicine

## 2012-12-14 ENCOUNTER — Encounter: Payer: Self-pay | Admitting: Family Medicine

## 2012-12-14 ENCOUNTER — Ambulatory Visit (INDEPENDENT_AMBULATORY_CARE_PROVIDER_SITE_OTHER): Payer: Medicare Other | Admitting: Family Medicine

## 2012-12-14 VITALS — BP 120/80 | HR 62 | Temp 97.9°F | Resp 16 | Wt 206.0 lb

## 2012-12-14 DIAGNOSIS — R591 Generalized enlarged lymph nodes: Secondary | ICD-10-CM | POA: Insufficient documentation

## 2012-12-14 DIAGNOSIS — R599 Enlarged lymph nodes, unspecified: Secondary | ICD-10-CM

## 2012-12-14 DIAGNOSIS — J019 Acute sinusitis, unspecified: Secondary | ICD-10-CM

## 2012-12-14 LAB — CBC WITH DIFFERENTIAL/PLATELET
Basophils Absolute: 0 10*3/uL (ref 0.0–0.1)
Basophils Relative: 0 % (ref 0–1)
Lymphocytes Relative: 25 % (ref 12–46)
MCHC: 34 g/dL (ref 30.0–36.0)
Monocytes Absolute: 0.5 10*3/uL (ref 0.1–1.0)
Neutro Abs: 4 10*3/uL (ref 1.7–7.7)
Neutrophils Relative %: 65 % (ref 43–77)
Platelets: 195 10*3/uL (ref 150–400)
RDW: 12.9 % (ref 11.5–15.5)
WBC: 6.2 10*3/uL (ref 4.0–10.5)

## 2012-12-14 LAB — BASIC METABOLIC PANEL
BUN: 17 mg/dL (ref 6–23)
Chloride: 102 mEq/L (ref 96–112)
Potassium: 4.8 mEq/L (ref 3.5–5.3)
Sodium: 137 mEq/L (ref 135–145)

## 2012-12-14 MED ORDER — AMOXICILLIN 500 MG PO CAPS: 500.0000 mg | ORAL_CAPSULE | Freq: Three times a day (TID) | ORAL | Status: DC

## 2012-12-14 NOTE — Progress Notes (Signed)
  Subjective:    Patient ID: Christina Lyons, female    DOB: 05/12/1940, 72 y.o.   MRN: [id number]  HPI  Pt here with swollen lymph nodes, chills, pain with swallowing for past week, feels like it is worsening. Mild sore throat, feels drainage to ears and down throat. Denies fever, but admits to sinus pressure. Denies cough, SOB, N/V.  Review of Systems- per above   GEN- + fatigue, fever, weight loss,weakness, recent illness HEENT- denies eye drainage, change in vision, nasal discharge, CVS- denies chest pain, palpitations RESP- denies SOB, cough, wheeze Neuro- denies headache, dizziness, syncope, seizure activity      Objective:   Physical Exam GEN- NAD, alert and oriented x3 HEENT- PERRL, EOMI, non injected sclera, pink conjunctiva, MMM, oropharynx mild injection, TM clear bilat no effusion, mild maxillary sinus tenderness, nares clear Neck- Supple, + LAD Right anterior cervical TTP CVS- RRR, no murmur RESP-CTAB Skin- hyperpigmented keratotic lesion posterior right ear lob EXT- No edema Pulses- Radial 2+         Assessment & Plan:

## 2012-12-14 NOTE — Assessment & Plan Note (Signed)
Pt with mostly LAD and ear pressure, I think this is stemming for sinusitis flare, treatment per above, check labs

## 2012-12-14 NOTE — Assessment & Plan Note (Signed)
Will treat with antibiotics, check CBC with diff

## 2012-12-14 NOTE — Patient Instructions (Addendum)
Take the antibiotics as prescribed  Call if any symptoms change Plenty of fluids F/U as needed

## 2013-01-04 ENCOUNTER — Encounter: Payer: Self-pay | Admitting: Physician Assistant

## 2013-01-04 ENCOUNTER — Ambulatory Visit (INDEPENDENT_AMBULATORY_CARE_PROVIDER_SITE_OTHER): Payer: Medicare Other | Admitting: Physician Assistant

## 2013-01-04 VITALS — BP 142/88 | HR 68 | Temp 98.3°F | Resp 18 | Ht 61.25 in | Wt 205.0 lb

## 2013-01-04 DIAGNOSIS — S8010XA Contusion of unspecified lower leg, initial encounter: Secondary | ICD-10-CM

## 2013-01-04 DIAGNOSIS — S8012XA Contusion of left lower leg, initial encounter: Secondary | ICD-10-CM
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
## 2013-01-07 ENCOUNTER — Other Ambulatory Visit (HOSPITAL_COMMUNITY): Payer: Self-pay | Admitting: Orthopedic Surgery

## 2013-01-07 DIAGNOSIS — M25561 Pain in right knee: Secondary | ICD-10-CM

## 2013-01-07 DIAGNOSIS — M7989 Other specified soft tissue disorders: Secondary | ICD-10-CM

## 2013-01-07 DIAGNOSIS — Z96659 Presence of unspecified artificial knee joint: Secondary | ICD-10-CM | POA: Insufficient documentation

## 2013-01-08 ENCOUNTER — Ambulatory Visit (HOSPITAL_COMMUNITY)
Admission: RE | Admit: 2013-01-08 | Discharge: 2013-01-08 | Disposition: A | Discharge status: Home / Self Care | Payer: Medicare Other | Source: Ambulatory Visit | Attending: Orthopedic Surgery | Admitting: Orthopedic Surgery

## 2013-01-08 DIAGNOSIS — M25561 Pain in right knee: Secondary | ICD-10-CM

## 2013-01-08 DIAGNOSIS — M79609 Pain in unspecified limb: Secondary | ICD-10-CM

## 2013-01-08 DIAGNOSIS — M7989 Other specified soft tissue disorders: Secondary | ICD-10-CM | POA: Insufficient documentation

## 2013-01-08 NOTE — Progress Notes (Signed)
*  Preliminary Results* Right lower extremity venous duplex completed. Right lower extremity is negative for deep vein thrombosis. There is no evidence of right Baker's cyst.  Attempted to call preliminary results to Dr.Lucey's office, however there was no answer. Patient was discharged and if necessary can be reached by phone.  01/08/2013 10:18 AM  Gertie Fey, RVT, RDCS, RDMS

## 2013-02-04 ENCOUNTER — Encounter: Payer: Self-pay | Admitting: Family Medicine

## 2013-02-04 ENCOUNTER — Ambulatory Visit (INDEPENDENT_AMBULATORY_CARE_PROVIDER_SITE_OTHER): Payer: Medicare Other | Admitting: Family Medicine

## 2013-02-04 VITALS — BP 120/78 | HR 72 | Temp 97.7°F | Resp 18 | Wt 207.0 lb

## 2013-02-04 DIAGNOSIS — L0291 Cutaneous abscess, unspecified: Secondary | ICD-10-CM

## 2013-02-04 DIAGNOSIS — L0390 Cellulitis, unspecified: Secondary | ICD-10-CM

## 2013-02-04 MED ORDER — CEPHALEXIN 500 MG PO CAPS: 500.0000 mg | ORAL_CAPSULE | Freq: Three times a day (TID) | ORAL | Status: DC

## 2013-02-04 MED ORDER — HYDROCODONE-ACETAMINOPHEN 5-325 MG PO TABS: 1.0000 | ORAL_TABLET | Freq: Four times a day (QID) | ORAL | Status: DC | PRN

## 2013-02-04 NOTE — Progress Notes (Signed)
  Subjective:    Patient ID: Christina Lyons, female    DOB: 10-30-1940, 72 y.o.   MRN: [id number]  HPI Patient is a very pleasant 72 year old white female who has a history of osteomyelitis in her right lower leg. She was in [place] one month ago and suffered a contusion to the posterior aspect of her right  calf. Ever since then they have been red hot swollen and painful. She underwent venous ultrasound of the legs that rule out a DVT. Dr. Sherlean Foot he started her on 2 weeks of Cipro to treat cellulitis. The redness improved but never totally subsided. Since discontinuing antibiotic the redness has worsened there has become swollen in the posterior aspect of her left calf is warm to the touch. The skin is itching and tender. Past Medical History  Diagnosis Date  . Osteomyelitis 1946    both legs  . Hyperlipidemia   . Hypertension   . Other idiopathic scoliosis, thoracolumbar region   . Osteopenia   . GERD (gastroesophageal reflux disease)   . DDD (degenerative disc disease), lumbar    Current Outpatient Prescriptions on File Prior to Visit  Medication Sig Dispense Refill  . lisinopril (PRINIVIL,ZESTRIL) 10 MG tablet TAKE 1 TABLET BY MOUTH DAILY  30 tablet  5  . LORazepam (ATIVAN) 1 MG tablet TAKE 1 TABLET TWICE A DAY  60 tablet  0  . raloxifene (EVISTA) 60 MG tablet TAKE 1 TABLET BY MOUTH EVERY DAY  30 tablet  5   No current facility-administered medications on file prior to visit.   Allergies  Allergen Reactions  . Sulfa Antibiotics Hives   History   Social History  . Marital Status: Widowed    Spouse Name: N/A    Number of Children: N/A  . Years of Education: N/A   Occupational History  . Not on file.   Social History Main Topics  . Smoking status: Former Games developer  . Smokeless tobacco: Not on file  . Alcohol Use: No  . Drug Use: No  . Sexual Activity: Not on file   Other Topics Concern  . Not on file   Social History Narrative  . No narrative on file      Review  of Systems  All other systems reviewed and are negative.       Objective:   Physical Exam  Vitals reviewed. Cardiovascular: Normal rate and regular rhythm.   Pulmonary/Chest: Effort normal and breath sounds normal.  Musculoskeletal: She exhibits edema.  Skin: There is erythema.   the right leg distal to the cath is erythematous tender and swollen. The majority of the erythema is in the posterior aspect of the leg        Assessment & Plan:  1. Cellulitis Most likely, the patient suffered a contusion to the posterior aspect of the right calf. This became subsequently infected and developed cellulitis. I believe the Cipro likely had intermediate sensitivity to suppress the infection but did not treat completely. Therefore I start the patient on Keflex 500 mg by mouth 3 times a day for 10 days. If symptoms worsen I would switch to doxycycline to cover MRSA. Recheck in one week or sooner if worse. - cephALEXin (KEFLEX) 500 MG capsule; Take 1 capsule (500 mg total) by mouth 3 (three) times daily.  Dispense: 30 capsule; Refill: 0

## 2013-02-08 ENCOUNTER — Ambulatory Visit (INDEPENDENT_AMBULATORY_CARE_PROVIDER_SITE_OTHER): Payer: Medicare Other | Admitting: Family Medicine

## 2013-02-08 ENCOUNTER — Encounter: Payer: Self-pay | Admitting: Family Medicine

## 2013-02-08 VITALS — BP 110/80 | HR 76 | Temp 97.3°F | Resp 16 | Wt 207.0 lb

## 2013-02-08 DIAGNOSIS — I8311 Varicose veins of right lower extremity with inflammation: Secondary | ICD-10-CM

## 2013-02-08 DIAGNOSIS — I8310 Varicose veins of unspecified lower extremity with inflammation: Secondary | ICD-10-CM

## 2013-02-08 DIAGNOSIS — L0291 Cutaneous abscess, unspecified: Secondary | ICD-10-CM

## 2013-02-08 DIAGNOSIS — L0390 Cellulitis, unspecified: Secondary | ICD-10-CM

## 2013-02-08 MED ORDER — TRIAMCINOLONE ACETONIDE 0.1 % EX CREA: 1.0000 "application " | TOPICAL_CREAM | Freq: Two times a day (BID) | CUTANEOUS | Status: DC

## 2013-02-08 NOTE — Progress Notes (Signed)
Subjective:    Patient ID: Christina Lyons, female    DOB: 07-19-1940, 72 y.o.   MRN: [id number]  HPI 02/04/13 Patient is a very pleasant 72 year old white female who has a history of osteomyelitis in her right lower leg. She was in [place] one month ago and suffered a contusion to the posterior aspect of her right  calf. Ever since then they have been red hot swollen and painful. She underwent venous ultrasound of the legs that rule out a DVT. Dr. Sherlean Foot he started her on 2 weeks of Cipro to treat cellulitis. The redness improved but never totally subsided. Since discontinuing antibiotic the redness has worsened there has become swollen in the posterior aspect of her left calf is warm to the touch. The skin is itching and tender.  At that time, my plan was: 1. Cellulitis Most likely, the patient suffered a contusion to the posterior aspect of the right calf. This became subsequently infected and developed cellulitis. I believe the Cipro likely had intermediate sensitivity to suppress the infection but did not treat completely. Therefore I start the patient on Keflex 500 mg by mouth 3 times a day for 10 days. If symptoms worsen I would switch to doxycycline to cover MRSA. Recheck in one week or sooner if worse. - cephALEXin (KEFLEX) 500 MG capsule; Take 1 capsule (500 mg total) by mouth 3 (three) times daily.  Dispense: 30 capsule; Refill: 0  02/08/13 Patient is here today for followup.  She states that her leg feels better. There is still a 4 cm x 5 cm subcutaneous hematoma on the posterior aspect of her right calf. However the overlying erythema has faded dramatically. The warmth is much less. It is not as painful. It still itches and stings on the surface of the skin. Past Medical History  Diagnosis Date  . Osteomyelitis 1946    both legs  . Hyperlipidemia   . Hypertension   . Other idiopathic scoliosis, thoracolumbar region   . Osteopenia   . GERD (gastroesophageal reflux disease)   . DDD  (degenerative disc disease), lumbar    Current Outpatient Prescriptions on File Prior to Visit  Medication Sig Dispense Refill  . cephALEXin (KEFLEX) 500 MG capsule Take 1 capsule (500 mg total) by mouth 3 (three) times daily.  30 capsule  0  . HYDROcodone-acetaminophen (NORCO/VICODIN) 5-325 MG per tablet Take 1 tablet by mouth every 6 (six) hours as needed.  60 tablet  0  . lisinopril (PRINIVIL,ZESTRIL) 10 MG tablet TAKE 1 TABLET BY MOUTH DAILY  30 tablet  5  . LORazepam (ATIVAN) 1 MG tablet TAKE 1 TABLET TWICE A DAY  60 tablet  0  . raloxifene (EVISTA) 60 MG tablet TAKE 1 TABLET BY MOUTH EVERY DAY  30 tablet  5   No current facility-administered medications on file prior to visit.   Allergies  Allergen Reactions  . Sulfa Antibiotics Hives   History   Social History  . Marital Status: Widowed    Spouse Name: N/A    Number of Children: N/A  . Years of Education: N/A   Occupational History  . Not on file.   Social History Main Topics  . Smoking status: Former Games developer  . Smokeless tobacco: Not on file  . Alcohol Use: No  . Drug Use: No  . Sexual Activity: Not on file   Other Topics Concern  . Not on file   Social History Narrative  . No narrative on file  Review of Systems  All other systems reviewed and are negative.       Objective:   Physical Exam  Vitals reviewed. Cardiovascular: Normal rate and regular rhythm.   Pulmonary/Chest: Effort normal and breath sounds normal.  Musculoskeletal: She exhibits edema.  Skin: No erythema.   the erythema that was present last time on the posterior aspect of her right calf has faded. There is still a 4 cm x 5 cm subcutaneous 18 hematoma but is no longer infected. There are surface a linear striations and lichenification is consistent with stasis dermatitis due to the swelling. I believe this is what is itching and stinging.       Assessment & Plan:   1. Stasis dermatitis, acute, right The leg has swollen due to  the infection. This is causing mild stasis dermatitis. Therefore I recommended triamcinolone 0.1% cream twice a day for one week. Also recommended compression stockings and elevation of the leg the - triamcinolone cream (KENALOG) 0.1 %; Apply 1 application topically 2 (two) times daily.  Dispense: 30 g; Refill: 0  2. Cellulitis The cellulitis is clinically improving. Therefore I recommended that she continue and complete the Keflex. Recheck in 1 week if no better or sooner if worse

## 2013-02-24 ENCOUNTER — Other Ambulatory Visit: Payer: Self-pay | Admitting: Family Medicine

## 2013-02-26 NOTE — Telephone Encounter (Signed)
ok 

## 2013-02-26 NOTE — Telephone Encounter (Signed)
?   Ok to refill, last refill 10/31/12

## 2013-03-09 ENCOUNTER — Telehealth: Payer: Self-pay | Admitting: Family Medicine

## 2013-03-09 MED ORDER — AZITHROMYCIN 250 MG PO TABS: ORAL_TABLET | ORAL | Status: DC

## 2013-03-09 NOTE — Telephone Encounter (Signed)
Pt called and made aware of Rx sent.  NTBS if not better

## 2013-03-09 NOTE — Telephone Encounter (Signed)
Please call patient out a z-pack.  NTBS if worse.

## 2013-03-09 NOTE — Telephone Encounter (Signed)
Has had cold,cough since weekend. No fever. Today is cough up green blood tinged secretions.  No appt available next few days.  Wants to know if you can start her on something??

## 2013-03-30 ENCOUNTER — Other Ambulatory Visit: Payer: Self-pay | Admitting: Family Medicine

## 2013-03-30 NOTE — Telephone Encounter (Signed)
ok 

## 2013-03-30 NOTE — Telephone Encounter (Signed)
?   Ok to refill;last refill 02/24/13;last ov 02/08/13

## 2013-04-05 ENCOUNTER — Telehealth: Payer: Self-pay | Admitting: *Deleted

## 2013-04-05 MED ORDER — PROMETHAZINE HCL 25 MG PO TABS: 25.0000 mg | ORAL_TABLET | Freq: Three times a day (TID) | ORAL | Status: DC | PRN

## 2013-04-05 NOTE — Telephone Encounter (Signed)
Meds refilled.

## 2013-05-06 ENCOUNTER — Ambulatory Visit (INDEPENDENT_AMBULATORY_CARE_PROVIDER_SITE_OTHER): Payer: Medicare Other | Admitting: Family Medicine

## 2013-05-06 ENCOUNTER — Encounter: Payer: Self-pay | Admitting: Family Medicine

## 2013-05-06 VITALS — BP 142/86 | HR 72 | Temp 97.4°F | Resp 18 | Ht 61.25 in | Wt 207.0 lb

## 2013-05-06 DIAGNOSIS — J209 Acute bronchitis, unspecified: Secondary | ICD-10-CM

## 2013-05-06 MED ORDER — HYDROCODONE-HOMATROPINE 5-1.5 MG/5ML PO SYRP: 5.0000 mL | ORAL_SOLUTION | ORAL | Status: DC | PRN

## 2013-05-06 MED ORDER — AZITHROMYCIN 250 MG PO TABS: ORAL_TABLET | ORAL | Status: DC

## 2013-05-06 NOTE — Progress Notes (Signed)
Subjective:    Patient ID: Christina Lyons, female    DOB: October 08, 1940, 73 y.o.   MRN: [id number]  HPI Patient is a 73 year old female who has had a nonproductive cough times one week. Cough seems to be worsening. She now has pleurisy in the right posterior lung and the left posterior lung. She denies any hemoptysis. She denies any fevers or chills. However she reports shortness of breath and wheezing. She denies any rhinorrhea or sinus pressure. She denies any otalgia. She denies any sore throat. Denies any nausea vomiting or diarrhea. The shortness of breath and cough seem to be worsening. She denies any myalgias or other flulike symptoms. Past Medical History  Diagnosis Date  . Osteomyelitis 1946    both legs  . Hyperlipidemia   . Hypertension   . Other idiopathic scoliosis, thoracolumbar region   . Osteopenia   . GERD (gastroesophageal reflux disease)   . DDD (degenerative disc disease), lumbar    Current Outpatient Prescriptions on File Prior to Visit  Medication Sig Dispense Refill  . HYDROcodone-acetaminophen (NORCO/VICODIN) 5-325 MG per tablet Take 1 tablet by mouth every 6 (six) hours as needed.  60 tablet  0  . lisinopril (PRINIVIL,ZESTRIL) 10 MG tablet TAKE 1 TABLET BY MOUTH DAILY  30 tablet  5  . LORazepam (ATIVAN) 1 MG tablet TAKE 1 TABLET BY MOUTH TWICE A DAY  60 tablet  0  . promethazine (PHENERGAN) 25 MG tablet Take 1 tablet (25 mg total) by mouth every 8 (eight) hours as needed for nausea or vomiting.  20 tablet  1  . raloxifene (EVISTA) 60 MG tablet TAKE 1 TABLET BY MOUTH EVERY DAY  30 tablet  5  . triamcinolone cream (KENALOG) 0.1 % Apply 1 application topically 2 (two) times daily.  30 g  0   No current facility-administered medications on file prior to visit.   Allergies  Allergen Reactions  . Sulfa Antibiotics Hives   History   Social History  . Marital Status: Widowed    Spouse Name: N/A    Number of Children: N/A  . Years of Education: N/A    Occupational History  . Not on file.   Social History Main Topics  . Smoking status: Former Research scientist (life sciences)  . Smokeless tobacco: Not on file  . Alcohol Use: No  . Drug Use: No  . Sexual Activity: Not on file   Other Topics Concern  . Not on file   Social History Narrative  . No narrative on file      Review of Systems  All other systems reviewed and are negative.       Objective:   Physical Exam  Vitals reviewed. Constitutional: She appears well-developed and well-nourished. No distress.  HENT:  Right Ear: Tympanic membrane, external ear and ear canal normal.  Left Ear: Tympanic membrane, external ear and ear canal normal.  Nose: No mucosal edema or rhinorrhea. Right sinus exhibits no maxillary sinus tenderness and no frontal sinus tenderness. Left sinus exhibits no maxillary sinus tenderness and no frontal sinus tenderness.  Mouth/Throat: Uvula is midline, oropharynx is clear and moist and mucous membranes are normal. No oropharyngeal exudate.  Neck: Neck supple.  Cardiovascular: Normal rate, regular rhythm and normal heart sounds.   No murmur heard. Pulmonary/Chest: Effort normal. No respiratory distress. She has wheezes. She has no rales. She exhibits no tenderness.  Lymphadenopathy:    She has no cervical adenopathy.  Skin: She is not diaphoretic.  Assessment & Plan:  1. Acute bronchitis Take Mucinex DM as needed for cough and congestion. She can also use Hycodan 1 teaspoon every 4 hours as needed for cough.  Patient has bronchitis. I explained to the patient that the majority of bronchitis is viral. She is concerned because her condition is worsening and would like an anabolic prescribed period of time the patient a z-pack. - HYDROcodone-homatropine (HYCODAN) 5-1.5 MG/5ML syrup; Take 5 mLs by mouth every 4 (four) hours as needed for cough.  Dispense: 120 mL; Refill: 0 - azithromycin (ZITHROMAX) 250 MG tablet; 2 tabs poqday1, 1 tab poqday 2-5  Dispense: 6  tablet; Refill: 0

## 2013-05-21 ENCOUNTER — Encounter: Payer: Self-pay | Admitting: Family Medicine

## 2013-05-29 ENCOUNTER — Other Ambulatory Visit: Payer: Self-pay | Admitting: Family Medicine

## 2013-05-31 ENCOUNTER — Other Ambulatory Visit: Payer: Self-pay | Admitting: Family Medicine

## 2013-05-31 ENCOUNTER — Telehealth: Payer: Self-pay | Admitting: Family Medicine

## 2013-05-31 MED ORDER — HYDROCODONE-ACETAMINOPHEN 5-325 MG PO TABS: 1.0000 | ORAL_TABLET | Freq: Four times a day (QID) | ORAL | Status: DC | PRN

## 2013-05-31 NOTE — Telephone Encounter (Signed)
ok 

## 2013-05-31 NOTE — Telephone Encounter (Signed)
?   Ok to refill, last ov 05/06/13; last refill 02/04/13

## 2013-05-31 NOTE — Telephone Encounter (Signed)
Med printed ready for provider signature and pt to pick up 

## 2013-05-31 NOTE — Telephone Encounter (Signed)
Call back number is [phone] Pt is needing refill on her hydrocodone

## 2013-06-01 NOTE — Telephone Encounter (Signed)
Refill appropriate and filled per protocol. 

## 2013-06-22 ENCOUNTER — Encounter: Payer: Self-pay | Admitting: Family Medicine

## 2013-06-23 ENCOUNTER — Emergency Department (INDEPENDENT_AMBULATORY_CARE_PROVIDER_SITE_OTHER): Payer: Medicare Other

## 2013-06-23 ENCOUNTER — Emergency Department (HOSPITAL_COMMUNITY)
Admission: EM | Admit: 2013-06-23 | Discharge: 2013-06-23 | Disposition: A | Discharge status: Home / Self Care | Payer: Medicare Other | Source: Home / Self Care

## 2013-06-23 ENCOUNTER — Encounter (HOSPITAL_COMMUNITY): Payer: Self-pay | Admitting: Emergency Medicine

## 2013-06-23 DIAGNOSIS — S63509A Unspecified sprain of unspecified wrist, initial encounter: Secondary | ICD-10-CM

## 2013-06-23 DIAGNOSIS — S1093XA Contusion of unspecified part of neck, initial encounter: Secondary | ICD-10-CM

## 2013-06-23 DIAGNOSIS — S40012A Contusion of left shoulder, initial encounter: Secondary | ICD-10-CM

## 2013-06-23 DIAGNOSIS — S60229A Contusion of unspecified hand, initial encounter: Secondary | ICD-10-CM

## 2013-06-23 DIAGNOSIS — S63502A Unspecified sprain of left wrist, initial encounter: Secondary | ICD-10-CM

## 2013-06-23 DIAGNOSIS — S40019A Contusion of unspecified shoulder, initial encounter: Secondary | ICD-10-CM

## 2013-06-23 DIAGNOSIS — S60222A Contusion of left hand, initial encounter: Secondary | ICD-10-CM

## 2013-06-23 DIAGNOSIS — S0003XA Contusion of scalp, initial encounter: Secondary | ICD-10-CM

## 2013-06-23 DIAGNOSIS — S00531A Contusion of lip, initial encounter: Secondary | ICD-10-CM

## 2013-06-23 DIAGNOSIS — S0083XA Contusion of other part of head, initial encounter: Secondary | ICD-10-CM

## 2013-06-23 MED ORDER — HYDROCODONE-ACETAMINOPHEN 5-325 MG PO TABS: 1.0000 | ORAL_TABLET | ORAL | Status: DC | PRN

## 2013-06-23 NOTE — ED Notes (Signed)
States she tripped and fell in a store parking lot yesterday, causing her to fall. C/o pain in left shoulder (feels "quicky" ) pain in left wrist (worse w ROM ) wrist swollen and ecchymotic. Has discoloration of lower face, left orbital area . States her teeth feel normal , and denies LOC

## 2013-06-23 NOTE — ED Provider Notes (Signed)
CSN: [id number]     Arrival date & time 06/23/13  1340 History   First MD Initiated Contact with Patient 06/23/13 1548     Chief Complaint  Patient presents with  . Fall   (Consider location/radiation/quality/duration/timing/severity/associated sxs/prior Treatment) HPI Comments: 73 year old female experienced a mechanical fall in a store parking lot yesterday. She states that she injured her left wrist and hand as well as struck her chin on the cranium and fell onto her left shoulder. She did not strike her head or injure her neck. Her other extremities are asymptomatic. EMS had been called and they gave an opinion of probably no fracture and offered the patient arrived to the emergency department but the patient declined at the time.   Past Medical History  Diagnosis Date  . Osteomyelitis 1946    both legs  . Hyperlipidemia   . Hypertension   . Other idiopathic scoliosis, thoracolumbar region   . Osteopenia   . GERD (gastroesophageal reflux disease)   . DDD (degenerative disc disease), lumbar    Past Surgical History  Procedure Laterality Date  . Abdominal hysterectomy    . Cholecystectomy     History reviewed. No pertinent family history. History  Substance Use Topics  . Smoking status: Former Research scientist (life sciences)  . Smokeless tobacco: Not on file  . Alcohol Use: No   OB History   Grav Para Term Preterm Abortions TAB SAB Ect Mult Living                 Review of Systems  Constitutional: Negative.   HENT: Positive for sinus pressure. Negative for congestion, ear pain, facial swelling, nosebleeds, sore throat and trouble swallowing.        Tenderness to the chin and lower lip.  Eyes: Negative.   Respiratory: Negative.   Cardiovascular: Negative.   Gastrointestinal: Negative.   Genitourinary: Negative.   Musculoskeletal: Positive for joint swelling. Negative for arthralgias, back pain, myalgias, neck pain and neck stiffness.  Skin: Positive for color change and wound.   Neurological: Negative for dizziness, syncope, speech difficulty, weakness, light-headedness, numbness and headaches.    Allergies  Sulfa antibiotics  Home Medications   Current Outpatient Rx  Name  Route  Sig  Dispense  Refill  . HYDROcodone-acetaminophen (NORCO/VICODIN) 5-325 MG per tablet   Oral   Take 1 tablet by mouth every 6 (six) hours as needed.   60 tablet   0   . HYDROcodone-acetaminophen (NORCO/VICODIN) 5-325 MG per tablet   Oral   Take 1 tablet by mouth every 4 (four) hours as needed.   15 tablet   0   . lisinopril (PRINIVIL,ZESTRIL) 10 MG tablet      TAKE 1 TABLET BY MOUTH DAILY   30 tablet   5   . LORazepam (ATIVAN) 1 MG tablet      TAKE 1 TABLET BY MOUTH TWICE A DAY   60 tablet   0   . promethazine (PHENERGAN) 25 MG tablet   Oral   Take 1 tablet (25 mg total) by mouth every 8 (eight) hours as needed for nausea or vomiting.   20 tablet   1   . raloxifene (EVISTA) 60 MG tablet      TAKE 1 TABLET BY MOUTH EVERY DAY   30 tablet   5   . triamcinolone cream (KENALOG) 0.1 %   Topical   Apply 1 application topically 2 (two) times daily.   30 g   0    BP 149/66  Pulse 60  Temp(Src) 98.1 F (36.7 C) (Oral)  Resp 16  SpO2 100% Physical Exam  Nursing note and vitals reviewed. Constitutional: She is oriented to person, place, and time. She appears well-developed and well-nourished. No distress.  HENT:  Head: Normocephalic and atraumatic.  Mouth/Throat: Oropharynx is clear and moist. No oropharyngeal exudate.  Back tenderness to the TMJ. There is an annular area of ecchymosis to the anterior chin as well as the lower lip involving the vermilion and mucosal side of the lower lip. Teeth , tongue and other intraoral structures are intact.  Eyes: EOM are normal. Pupils are equal, round, and reactive to light.  Neck: Normal range of motion. Neck supple.  No tenderness to the cervical spine.  Cardiovascular: Normal rate, regular rhythm and normal  heart sounds.   Pulmonary/Chest: Effort normal and breath sounds normal. No respiratory distress. She has no wheezes.  Abdominal: Soft. Bowel sounds are normal.  Musculoskeletal:  Left shoulder with tenderness to the anterior shoulder joint. Patient is able to abduct her arm to 110. Internal and external rotation is intact but with some pain in the anterior aspect. No deformity or discoloration.  Left wrist and hand with swelling. There is tenderness primarily to the radial aspect of the left wrist and to the fourth and fifth metacarpals. Ecchymosis covers most of the dorsum of the hand and wrist. No deformity. Capillary refill is less than 2 seconds, radial pulse 2+, digital range of motion intact, neurovascular intact.  Lymphadenopathy:    She has no cervical adenopathy.  Neurological: She is alert and oriented to person, place, and time. No cranial nerve deficit. She exhibits normal muscle tone.  Skin: Skin is warm and dry.  Psychiatric: She has a normal mood and affect.    ED Course  Procedures (including critical care time) Labs Review Labs Reviewed - No data to display Imaging Review Dg Wrist Complete Left  06/23/2013   CLINICAL DATA:  [place], injured, pain.  EXAM: LEFT WRIST - COMPLETE 3+ VIEW  COMPARISON:  Hand radiograph 12/18/2004.  FINDINGS: There is no evidence of fracture or dislocation. There is no evidence of erosive arthropathy or other focal bone abnormality. Soft tissues are unremarkable. Mild to moderate osteopenia. Degenerative changes noted at the radiocarpal, intercarpal, and carpometacarpal joints. Prominent vascular groove could be seen on previous wrist from 12/17/2004 laterally.  IMPRESSION: Negative for fracture.   Electronically Signed   By: Rolla Flatten M.D.   On: 06/23/2013 16:27   Dg Hand Complete Left  06/23/2013   CLINICAL DATA:  Left hand injury.  Pain and bruising.  EXAM: LEFT HAND - COMPLETE 3+ VIEW  COMPARISON:  DG HAND COMPLETE*L* dated 12/18/2004  FINDINGS:  Moderate degenerative change at the carpometacarpal and metacarpophalangeal joints. No fracture or dislocation. No significant soft tissue swelling or radiopaque foreign body.  IMPRESSION: Negative for fracture.  Moderate degenerative change.   Electronically Signed   By: Rolla Flatten M.D.   On: 06/23/2013 16:18     MDM   1. Contusion of left shoulder   2. Left wrist sprain   3. Contusion of left hand   4. Contusion of vermilion border of lower lip     Wear wrist splint and arm sling for about 4 days. Remove each daily and perform small movement s of the joints as demo'd to prevent frozen shoulder. RICE F/U with PCP or ortho for problems or may return if worse.  Norco 5mg  #15     Janne Napoleon,  NP 06/23/13 1646

## 2013-06-23 NOTE — Discharge Instructions (Signed)
Contusion A contusion is a deep bruise. Contusions are the result of an injury that caused bleeding under the skin. The contusion may turn blue, purple, or yellow. Minor injuries will give you a painless contusion, but more severe contusions may stay painful and swollen for a few weeks.  CAUSES  A contusion is usually caused by a blow, trauma, or direct force to an area of the body. SYMPTOMS   Swelling and redness of the injured area.  Bruising of the injured area.  Tenderness and soreness of the injured area.  Pain. DIAGNOSIS  The diagnosis can be made by taking a history and physical exam. An X-ray, CT scan, or MRI may be needed to determine if there were any associated injuries, such as fractures. TREATMENT  Specific treatment will depend on what area of the body was injured. In general, the best treatment for a contusion is resting, icing, elevating, and applying cold compresses to the injured area. Over-the-counter medicines may also be recommended for pain control. Ask your caregiver what the best treatment is for your contusion. HOME CARE INSTRUCTIONS   Put ice on the injured area.  Put ice in a plastic bag.  Place a towel between your skin and the bag.  Leave the ice on for 15-20 minutes, 03-04 times a day.  Only take over-the-counter or prescription medicines for pain, discomfort, or fever as directed by your caregiver. Your caregiver may recommend avoiding anti-inflammatory medicines (aspirin, ibuprofen, and naproxen) for 48 hours because these medicines may increase bruising.  Rest the injured area.  If possible, elevate the injured area to reduce swelling. SEEK IMMEDIATE MEDICAL CARE IF:   You have increased bruising or swelling.  You have pain that is getting worse.  Your swelling or pain is not relieved with medicines. MAKE SURE YOU:   Understand these instructions.  Will watch your condition.  Will get help right away if you are not doing well or get  worse. Document Released: 12/26/2004 Document Revised: 06/10/2011 Document Reviewed: 01/21/2011 [facility] Patient Information 2014 [place], [place].  Hand Contusion  A hand contusion is a deep bruise to the hand. Contusions happen when an injury causes bleeding under the skin. Signs of bruising include pain, puffiness (swelling), and discolored skin. The contusion may turn blue, purple, or yellow. HOME CARE  Put ice on the injured area.  Put ice in a plastic bag.  Place a towel between your skin and the bag.  Leave the ice on for 15-20 minutes, 03-04 times a day.  Only take medicines as told by your doctor.  Use an elastic wrap only as told. You may remove the wrap for sleeping, showering, and bathing. Take the wrap off if you lose feeling (have numbness) in your fingers, or they turn blue or cold. Put the wrap on more loosely.  Keep the hand raised (elevated) with pillows.  Avoid using your hand too much if it painful. GET HELP RIGHT AWAY IF:   You have more redness, puffiness, or pain in your hand.  Your puffiness or pain does not get better with medicine.  You lose feeling in your hand, or you cannot move your fingers.  Your hand turns cold or blue.  You have pain when you move your fingers.  Your hand feels warm.  Your contusion does not get better in 2 days. MAKE SURE YOU:   Understand these instructions.  Will watch this condition.  Will get help right away if you are not doing well or you  get worse. Document Released: 09/04/2007 Document Revised: 12/11/2011 Document Reviewed: 09/09/2011 [facility] Patient Information 2014 [place].  Wrist Sprain with Rehab A sprain is an injury in which a ligament that maintains the proper alignment of a joint is partially or completely torn. The ligaments of the wrist are susceptible to sprains. Sprains are classified into three categories. Grade 1 sprains cause pain, but the tendon is not lengthened. Grade 2 sprains  include a lengthened ligament because the ligament is stretched or partially ruptured. With grade 2 sprains there is still function, although the function may be diminished. Grade 3 sprains are characterized by a complete tear of the tendon or muscle, and function is usually impaired. SYMPTOMS   Pain tenderness, inflammation, and/or bruising (contusion) of the injury.  A "pop" or tear felt and/or heard at the time of injury.  Decreased wrist function. CAUSES  A wrist sprain occurs when a force is placed on one or more ligaments that is greater than it/they can withstand. Common mechanisms of injury include:  Catching a ball with you hands.  Repetitive and/ or strenuous extension or flexion of the wrist. RISK INCREASES WITH:  Previous wrist injury.  Contact sports (boxing or wrestling).  Activities in which falling is common.  Poor strength and flexibility.  Improperly fitted or padded protective equipment. PREVENTION  Warm up and stretch properly before activity.  Allow for adequate recovery between workouts.  Maintain physical fitness:  Strength, flexibility, and endurance.  Cardiovascular fitness.  Protect the wrist joint by limiting its motion with the use of taping, braces, or splints.  Protect the wrist after injury for 6 to 12 months. PROGNOSIS  The prognosis for wrist sprains depends on the degree of injury. Grade 1 sprains require 2 to 6 weeks of treatment. Grade 2 sprains require 6 to 8 weeks of treatment, and grade 3 sprains require up to 12 weeks.  RELATED COMPLICATIONS   Prolonged healing time, if improperly treated or re-injured.  Recurrent symptoms that result in a chronic problem.  Injury to nearby structures (bone, cartilage, nerves, or tendons).  Arthritis of the wrist.  Inability to compete in athletics at a high level.  Wrist stiffness or weakness.  Progression to a complete rupture of the ligament. TREATMENT  Treatment initially involves  resting from any activities that aggravate the symptoms, and the use of ice and medications to help reduce pain and inflammation. Your caregiver may recommend immobilizing the wrist for a period of time in order to reduce stress on the ligament and allow for healing. After immobilization it is important to perform strengthening and stretching exercises to help regain strength and a full range of motion. These exercises may be completed at home or with a therapist. Surgery is not usually required for wrist sprains, unless the ligament has been ruptured (grade 3 sprain). MEDICATION   If pain medication is necessary, then nonsteroidal anti-inflammatory medications, such as aspirin and ibuprofen, or other minor pain relievers, such as acetaminophen, are often recommended.  Do not take pain medication for 7 days before surgery.  Prescription pain relievers may be given if deemed necessary by your caregiver. Use only as directed and only as much as you need. HEAT AND COLD  Cold treatment (icing) relieves pain and reduces inflammation. Cold treatment should be applied for 10 to 15 minutes every 2 to 3 hours for inflammation and pain and immediately after any activity that aggravates your symptoms. Use ice packs or massage the area with a piece of ice (  ice massage).  Heat treatment may be used prior to performing the stretching and strengthening activities prescribed by your caregiver, physical therapist, or athletic trainer. Use a heat pack or soak your injury in warm water. SEEK MEDICAL CARE IF:  Treatment seems to offer no benefit, or the condition worsens.  Any medications produce adverse side effects. EXERCISES RANGE OF MOTION (ROM) AND STRETCHING EXERCISES - Wrist Sprain  These exercises may help you when beginning to rehabilitate your injury. Your symptoms may resolve with or without further involvement from your physician, physical therapist or athletic trainer. While completing these exercises,  remember:   Restoring tissue flexibility helps normal motion to return to the joints. This allows healthier, less painful movement and activity.  An effective stretch should be held for at least 30 seconds.  A stretch should never be painful. You should only feel a gentle lengthening or release in the stretched tissue. RANGE OF MOTION  Wrist Flexion, Active-Assisted  Extend your right / left elbow with your fingers pointing down.*  Gently pull the back of your hand towards you until you feel a gentle stretch on the top of your forearm.  Hold this position for __________ seconds. Repeat __________ times. Complete this exercise __________ times per day.  *If directed by your physician, physical therapist or athletic trainer, complete this stretch with your elbow bent rather than extended. RANGE OF MOTION  Wrist Extension, Active-Assisted  Extend your right / left elbow and turn your palm upwards.*  Gently pull your palm/fingertips back so your wrist extends and your fingers point more toward the ground.  You should feel a gentle stretch on the inside of your forearm.  Hold this position for __________ seconds. Repeat __________ times. Complete this exercise __________ times per day. *If directed by your physician, physical therapist or athletic trainer, complete this stretch with your elbow bent, rather than extended. RANGE OF MOTION  Supination, Active  Stand or sit with your elbows at your side. Bend your right / left elbow to 90 degrees.  Turn your palm upward until you feel a gentle stretch on the inside of your forearm.  Hold this position for __________ seconds. Slowly release and return to the starting position. Repeat __________ times. Complete this stretch __________ times per day.  RANGE OF MOTION  Pronation, Active  Stand or sit with your elbows at your side. Bend your right / left elbow to 90 degrees.  Turn your palm downward until you feel a gentle stretch on the top  of your forearm.  Hold this position for __________ seconds. Slowly release and return to the starting position. Repeat __________ times. Complete this stretch __________ times per day.  STRETCH - Wrist Flexion  Place the back of your right / left hand on a tabletop leaving your elbow slightly bent. Your fingers should point away from your body.  Gently press the back of your hand down onto the table by straightening your elbow. You should feel a stretch on the top of your forearm.  Hold this position for __________ seconds. Repeat __________ times. Complete this stretch __________ times per day.  STRETCH  Wrist Extension  Place your right / left fingertips on a tabletop leaving your elbow slightly bent. Your fingers should point backwards.  Gently press your fingers and palm down onto the table by straightening your elbow. You should feel a stretch on the inside of your forearm.  Hold this position for __________ seconds. Repeat __________ times. Complete this stretch __________ times  per day.  STRENGTHENING EXERCISES - Wrist Sprain These exercises may help you when beginning to rehabilitate your injury. They may resolve your symptoms with or without further involvement from your physician, physical therapist or athletic trainer. While completing these exercises, remember:   Muscles can gain both the endurance and the strength needed for everyday activities through controlled exercises.  Complete these exercises as instructed by your physician, physical therapist or athletic trainer. Progress with the resistance and repetition exercises only as your caregiver advises. STRENGTH Wrist Flexors  Sit with your right / left forearm palm-up and fully supported. Your elbow should be resting below the height of your shoulder. Allow your wrist to extend over the edge of the surface.  Loosely holding a __________ weight or a piece of rubber exercise band/tubing, slowly curl your hand up toward  your forearm.  Hold this position for __________ seconds. Slowly lower the wrist back to the starting position in a controlled manner. Repeat __________ times. Complete this exercise __________ times per day.  STRENGTH  Wrist Extensors  Sit with your right / left forearm palm-down and fully supported. Your elbow should be resting below the height of your shoulder. Allow your wrist to extend over the edge of the surface.  Loosely holding a __________ weight or a piece of rubber exercise band/tubing, slowly curl your hand up toward your forearm.  Hold this position for __________ seconds. Slowly lower the wrist back to the starting position in a controlled manner. Repeat __________ times. Complete this exercise __________ times per day.  STRENGTH - Ulnar Deviators  Stand with a ____________________ weight in your right / left hand, or sit holding on to the rubber exercise band/tubing with your opposite arm supported.  Move your wrist so that your pinkie travels toward your forearm and your thumb moves away from your forearm.  Hold this position for __________ seconds and then slowly lower the wrist back to the starting position. Repeat __________ times. Complete this exercise __________ times per day STRENGTH - Radial Deviators  Stand with a ____________________ weight in your  right / left hand, or sit holding on to the rubber exercise band/tubing with your arm supported.  Raise your hand upward in front of you or pull up on the rubber tubing.  Hold this position for __________ seconds and then slowly lower the wrist back to the starting position. Repeat __________ times. Complete this exercise __________ times per day. STRENGTH  Forearm Supinators  Sit with your right / left forearm supported on a table, keeping your elbow below shoulder height. Rest your hand over the edge, palm down.  Gently grip a hammer or a soup ladle.  Without moving your elbow, slowly turn your palm and  hand upward to a "thumbs-up" position.  Hold this position for __________ seconds. Slowly return to the starting position. Repeat __________ times. Complete this exercise __________ times per day.  STRENGTH  Forearm Pronators  Sit with your right / left forearm supported on a table, keeping your elbow below shoulder height. Rest your hand over the edge, palm up.  Gently grip a hammer or a soup ladle.  Without moving your elbow, slowly turn your palm and hand upward to a "thumbs-up" position.  Hold this position for __________ seconds. Slowly return to the starting position. Repeat __________ times. Complete this exercise __________ times per day.  STRENGTH - Grip  Grasp a tennis ball, a dense sponge, or a large, rolled sock in your hand.  Squeeze as hard  as you can without increasing any pain.  Hold this position for __________ seconds. Release your grip slowly. Repeat __________ times. Complete this exercise __________ times per day.  Document Released: 03/18/2005 Document Revised: 06/10/2011 Document Reviewed: 06/30/2008 [facility] Patient Information 2014 [place], [place].  Sprain A sprain is a tear in one of the strong, fibrous tissues that connect your bones (ligaments). The severity of the sprain depends on how much of the ligament is torn. The tear can be either partial or complete. CAUSES  Often, sprains are a result of a fall or an injury. The force of the impact causes the fibers of your ligament to stretch beyond their normal length. This excess tension causes the fibers of your ligament to tear. SYMPTOMS  You may have some loss of motion or increased pain within your normal range of motion. Other symptoms include:  Bruising.  Tenderness.  Swelling. DIAGNOSIS  In order to diagnose a sprain, your caregiver will physically examine you to determine how torn the ligament is. Your caregiver may also suggest an X-ray exam to make sure no bones are broken. TREATMENT  If your  ligament is only partially torn, treatment usually involves keeping the injured area in a fixed position (immobilization) for a short period. To do this, your caregiver will apply a bandage, cast, or splint to keep the area from moving until it heals. For a partially torn ligament, the healing process usually takes 2 to 3 weeks. If your ligament is completely torn, you may need surgery to reconnect the ligament to the bone or to reconstruct the ligament. After surgery, a cast or splint may be applied and will need to stay on for 4 to 6 weeks while your ligament heals. HOME CARE INSTRUCTIONS  Keep the injured area elevated to decrease swelling.  To ease pain and swelling, apply ice to your joint twice a day, for 2 to 3 days.  Put ice in a plastic bag.  Place a towel between your skin and the bag.  Leave the ice on for 15 minutes.  Only take over-the-counter or prescription medicine for pain as directed by your caregiver.  Do not leave the injured area unprotected until pain and stiffness go away (usually 3 to 4 weeks).  Do not allow your cast or splint to get wet. Cover your cast or splint with a plastic bag when you shower or bathe. Do not swim.  Your caregiver may suggest exercises for you to do during your recovery to prevent or limit permanent stiffness. SEEK IMMEDIATE MEDICAL CARE IF:  Your cast or splint becomes damaged.  Your pain becomes worse. MAKE SURE YOU:  Understand these instructions.  Will watch your condition.  Will get help right away if you are not doing well or get worse. Document Released: 03/15/2000 Document Revised: 06/10/2011 Document Reviewed: 03/30/2011 [facility] Patient Information 2014 [place], [place].

## 2013-06-24 NOTE — ED Provider Notes (Signed)
Medical screening examination/treatment/procedure(s) were performed by a resident physician or non-physician practitioner and as the supervising physician I was immediately available for consultation/collaboration.  Lynne Leader, MD    Gregor Hams, MD 06/24/13 [id number]

## 2013-06-28 ENCOUNTER — Other Ambulatory Visit: Payer: Self-pay | Admitting: Family Medicine

## 2013-06-28 NOTE — Telephone Encounter (Signed)
Refill appropriate and filled per protocol. 

## 2013-07-25 ENCOUNTER — Other Ambulatory Visit: Payer: Self-pay | Admitting: Family Medicine

## 2013-07-28 ENCOUNTER — Telehealth: Payer: Self-pay | Admitting: Family Medicine

## 2013-07-28 NOTE — Telephone Encounter (Signed)
Requesting a refill on Lorazepam - ? OK to Refill  

## 2013-07-29 MED ORDER — LORAZEPAM 1 MG PO TABS: ORAL_TABLET | ORAL | Status: DC

## 2013-07-29 NOTE — Telephone Encounter (Signed)
ok 

## 2013-07-29 NOTE — Telephone Encounter (Signed)
Rx Refilled  

## 2013-08-24 ENCOUNTER — Telehealth: Payer: Self-pay | Admitting: Family Medicine

## 2013-08-24 ENCOUNTER — Other Ambulatory Visit: Payer: Self-pay | Admitting: Family Medicine

## 2013-08-24 ENCOUNTER — Encounter: Payer: Self-pay | Admitting: Family Medicine

## 2013-08-24 MED ORDER — RALOXIFENE HCL 60 MG PO TABS: ORAL_TABLET | ORAL | Status: DC

## 2013-08-24 MED ORDER — LISINOPRIL 10 MG PO TABS: 10.0000 mg | ORAL_TABLET | Freq: Every day | ORAL | Status: DC

## 2013-08-24 NOTE — Telephone Encounter (Signed)
Rx Refilled  

## 2013-08-24 NOTE — Telephone Encounter (Signed)
Medication refill for one time only.  Patient needs to be seen.  Letter sent for patient to call and schedule 

## 2013-09-23 ENCOUNTER — Encounter: Payer: Self-pay | Admitting: Family Medicine

## 2013-09-23 ENCOUNTER — Ambulatory Visit (INDEPENDENT_AMBULATORY_CARE_PROVIDER_SITE_OTHER): Payer: Medicare Other | Admitting: Family Medicine

## 2013-09-23 VITALS — BP 130/78 | HR 64 | Temp 98.3°F | Resp 14 | Ht 60.0 in | Wt 207.0 lb

## 2013-09-23 DIAGNOSIS — M129 Arthropathy, unspecified: Secondary | ICD-10-CM

## 2013-09-23 DIAGNOSIS — M1990 Unspecified osteoarthritis, unspecified site: Secondary | ICD-10-CM

## 2013-09-23 DIAGNOSIS — G4700 Insomnia, unspecified: Secondary | ICD-10-CM

## 2013-09-23 DIAGNOSIS — I10 Essential (primary) hypertension: Secondary | ICD-10-CM

## 2013-09-23 LAB — CBC WITH DIFFERENTIAL/PLATELET
BASOS PCT: 1 % (ref 0–1)
Basophils Absolute: 0.1 10*3/uL (ref 0.0–0.1)
EOS ABS: 0.2 10*3/uL (ref 0.0–0.7)
EOS PCT: 3 % (ref 0–5)
HCT: 39.8 % (ref 36.0–46.0)
Hemoglobin: 13.5 g/dL (ref 12.0–15.0)
LYMPHS ABS: 1.3 10*3/uL (ref 0.7–4.0)
Lymphocytes Relative: 26 % (ref 12–46)
MCH: 33.8 pg (ref 26.0–34.0)
MCHC: 33.9 g/dL (ref 30.0–36.0)
MCV: 99.7 fL (ref 78.0–100.0)
MONOS PCT: 8 % (ref 3–12)
Monocytes Absolute: 0.4 10*3/uL (ref 0.1–1.0)
Neutro Abs: 3.2 10*3/uL (ref 1.7–7.7)
Neutrophils Relative %: 62 % (ref 43–77)
Platelets: 187 10*3/uL (ref 150–400)
RBC: 3.99 MIL/uL (ref 3.87–5.11)
RDW: 13 % (ref 11.5–15.5)
WBC: 5.1 10*3/uL (ref 4.0–10.5)

## 2013-09-23 LAB — LIPID PANEL
Cholesterol: 233 mg/dL — ABNORMAL HIGH (ref 0–200)
HDL: 71 mg/dL (ref >39)
LDL Cholesterol: 147 mg/dL — ABNORMAL HIGH (ref 0–99)
TRIGLYCERIDES: 77 mg/dL (ref <150)
Total CHOL/HDL Ratio: 3.3 Ratio
VLDL: 15 mg/dL (ref 0–40)

## 2013-09-23 LAB — COMPLETE METABOLIC PANEL WITH GFR
ALBUMIN: 4.1 g/dL (ref 3.5–5.2)
ALT: 13 U/L (ref 0–35)
AST: 17 U/L (ref 0–37)
Alkaline Phosphatase: 64 U/L (ref 39–117)
BILIRUBIN TOTAL: 0.5 mg/dL (ref 0.2–1.2)
BUN: 17 mg/dL (ref 6–23)
CO2: 28 mEq/L (ref 19–32)
Calcium: 9.2 mg/dL (ref 8.4–10.5)
Chloride: 105 mEq/L (ref 96–112)
Creat: 0.63 mg/dL (ref 0.50–1.10)
GFR, Est African American: >89 mL/min
GLUCOSE: 91 mg/dL (ref 70–99)
Potassium: 4.9 mEq/L (ref 3.5–5.3)
Sodium: 141 mEq/L (ref 135–145)
Total Protein: 6.5 g/dL (ref 6.0–8.3)

## 2013-09-23 MED ORDER — LORAZEPAM 1 MG PO TABS: ORAL_TABLET | ORAL | Status: DC

## 2013-09-23 MED ORDER — ZOSTER VACCINE LIVE 19400 UNT/0.65ML ~~LOC~~ SOLR: 0.6500 mL | Freq: Once | SUBCUTANEOUS | Status: DC

## 2013-09-23 MED ORDER — HYDROCODONE-ACETAMINOPHEN 5-325 MG PO TABS: 1.0000 | ORAL_TABLET | Freq: Four times a day (QID) | ORAL | Status: DC | PRN

## 2013-09-23 MED ORDER — RALOXIFENE HCL 60 MG PO TABS: ORAL_TABLET | ORAL | Status: DC

## 2013-09-23 MED ORDER — OMEPRAZOLE 20 MG PO CPDR: DELAYED_RELEASE_CAPSULE | ORAL | Status: DC

## 2013-09-23 MED ORDER — LISINOPRIL 10 MG PO TABS: 10.0000 mg | ORAL_TABLET | Freq: Every day | ORAL | Status: DC

## 2013-09-23 NOTE — Progress Notes (Signed)
   Subjective:    Patient ID: Christina Lyons, female    DOB: [birth year], 73 y.o.   MRN: [id number]  HPI Patient is here today for recheck of her blood pressure. She is tremulous and prepped and milligrams by mouth daily. She denies any chest pain shortness of breath or dyspnea on exertion. She also has a history of degenerative disc disease in her lumbar spine as well as scoliosis and thoracolumbar region. This causes chronic daily low back pain. She also has a history of osteoarthritis in both knees. Because of this reason she takes one half of a hydrocodone 5/325 mg pill per day. She displays no evidence of abuse or diversion. She has no history of falls or dizziness or weakness or imbalance. She also requires Ativan 1 mg by mouth each bedtime for insomnia. She has never tried trazodone. Past Medical History  Diagnosis Date  . Osteomyelitis 1946    both legs  . Hyperlipidemia   . Hypertension   . Other idiopathic scoliosis, thoracolumbar region   . Osteopenia   . GERD (gastroesophageal reflux disease)   . DDD (degenerative disc disease), lumbar    Current Outpatient Prescriptions on File Prior to Visit  Medication Sig Dispense Refill  . promethazine (PHENERGAN) 25 MG tablet Take 1 tablet (25 mg total) by mouth every 8 (eight) hours as needed for nausea or vomiting.  20 tablet  1   No current facility-administered medications on file prior to visit.   Allergies  Allergen Reactions  . Sulfa Antibiotics Hives   History   Social History  . Marital Status: Widowed    Spouse Name: N/A    Number of Children: N/A  . Years of Education: N/A   Occupational History  . Not on file.   Social History Main Topics  . Smoking status: Former Research scientist (life sciences)  . Smokeless tobacco: Not on file  . Alcohol Use: No  . Drug Use: No  . Sexual Activity: Not on file   Other Topics Concern  . Not on file   Social History Narrative  . No narrative on file      Review of Systems  All other systems  reviewed and are negative.      Objective:   Physical Exam  Vitals reviewed. Constitutional: She appears well-developed and well-nourished.  Neck: No JVD present. No thyromegaly present.  Cardiovascular: Normal rate, regular rhythm and normal heart sounds.  Exam reveals no gallop.   No murmur heard. Pulmonary/Chest: Effort normal and breath sounds normal. No respiratory distress. She has no wheezes. She has no rales.  Abdominal: Soft. Bowel sounds are normal. She exhibits no distension. There is no tenderness. There is no rebound and no guarding.  Musculoskeletal: She exhibits no edema.  Lymphadenopathy:    She has no cervical adenopathy.          Assessment & Plan:  1. Essential hypertension Blood pressure is excellent. I will check a CMP and a fasting lipid panel. Goal LDL is less than 130. - COMPLETE METABOLIC PANEL WITH GFR - Lipid panel - CBC with Differential  2. Insomnia Recommended she decrease Ativan to 0.5 mg by mouth each bedtime as needed for insomnia. In the future to try switching the patient to trazodone to reduce her risk of falls.  3. Arthritis Combination of lumbar degenerative disc disease as well as osteoarthritis in the knees. I am okay with the patient using hydrocodone sparingly. A refill but hydrocodone and Ativan today.

## 2013-09-24 ENCOUNTER — Encounter: Payer: Self-pay | Admitting: Family Medicine

## 2013-11-22 ENCOUNTER — Encounter: Payer: Self-pay | Admitting: Family Medicine

## 2013-11-22 ENCOUNTER — Ambulatory Visit (INDEPENDENT_AMBULATORY_CARE_PROVIDER_SITE_OTHER): Payer: Medicare Other | Admitting: Family Medicine

## 2013-11-22 VITALS — BP 140/88 | HR 72 | Temp 98.1°F | Resp 18 | Ht 60.0 in | Wt 210.0 lb

## 2013-11-22 DIAGNOSIS — J209 Acute bronchitis, unspecified: Secondary | ICD-10-CM

## 2013-11-22 MED ORDER — LORAZEPAM 1 MG PO TABS: ORAL_TABLET | ORAL | Status: DC

## 2013-11-22 MED ORDER — PREDNISONE 20 MG PO TABS: ORAL_TABLET | ORAL | Status: DC

## 2013-11-22 MED ORDER — HYDROCODONE-ACETAMINOPHEN 5-325 MG PO TABS: 1.0000 | ORAL_TABLET | Freq: Four times a day (QID) | ORAL | Status: DC | PRN

## 2013-11-22 MED ORDER — AZITHROMYCIN 250 MG PO TABS: ORAL_TABLET | ORAL | Status: DC

## 2013-11-22 NOTE — Progress Notes (Signed)
   Subjective:    Patient ID: Christina Lyons, female    DOB: Nov 27, 1940, 73 y.o.   MRN: [id number]  HPI Patient symptoms began 5 days ago. Patient describes shortness of breath and dyspnea on exertion. She had used albuterol every 4-6 hours. She also reports wheezing. Patient also complains of a nonproductive cough. She also has sinus pressure her frontal sinuses and postnasal drip. The albuterol does help but symptoms only improve about 3-4 hours.  She denies any fevers. She denies any chest pain. She denies any hemoptysis. She denies any sick contacts. Past Medical History  Diagnosis Date  . Osteomyelitis 1946    both legs  . Hyperlipidemia   . Hypertension   . Other idiopathic scoliosis, thoracolumbar region   . Osteopenia   . GERD (gastroesophageal reflux disease)   . DDD (degenerative disc disease), lumbar    Current Outpatient Prescriptions on File Prior to Visit  Medication Sig Dispense Refill  . lisinopril (PRINIVIL,ZESTRIL) 10 MG tablet Take 1 tablet (10 mg total) by mouth daily.  30 tablet  11  . omeprazole (PRILOSEC) 20 MG capsule TAKE 1 CAPSULE TWICE DAILY  60 capsule  11  . promethazine (PHENERGAN) 25 MG tablet Take 1 tablet (25 mg total) by mouth every 8 (eight) hours as needed for nausea or vomiting.  20 tablet  1  . raloxifene (EVISTA) 60 MG tablet TAKE 1 TABLET BY MOUTH EVERY DAY  30 tablet  11   No current facility-administered medications on file prior to visit.   Allergies  Allergen Reactions  . Sulfa Antibiotics Hives   History   Social History  . Marital Status: Widowed    Spouse Name: N/A    Number of Children: N/A  . Years of Education: N/A   Occupational History  . Not on file.   Social History Main Topics  . Smoking status: Former Research scientist (life sciences)  . Smokeless tobacco: Not on file  . Alcohol Use: No  . Drug Use: No  . Sexual Activity: Not on file   Other Topics Concern  . Not on file   Social History Narrative  . No narrative on file       Review of Systems  All other systems reviewed and are negative.      Objective:   Physical Exam  HENT:  Right Ear: External ear normal.  Left Ear: External ear normal.  Nose: Nose normal.  Mouth/Throat: Oropharynx is clear and moist. No oropharyngeal exudate.  Eyes: Conjunctivae are normal.  Neck: Neck supple. No JVD present.  Cardiovascular: Normal rate, regular rhythm and normal heart sounds.   Pulmonary/Chest: Effort normal. No respiratory distress. She has wheezes. She has rales.  Lymphadenopathy:    She has no cervical adenopathy.          Assessment & Plan:  1. Acute bronchitis, unspecified organism I believe the patient is experiencing acute bronchitis with reactive airway disease. Continue albuterol 2 puffs every 4-6 hours as needed. Begin prednisone taper pack to treat reactive airway disease. Start Zithromax to treat the acute bronchitis. Recheck in 48 hours if no better or sooner if worse. - predniSONE (DELTASONE) 20 MG tablet; 3 tabs poqday 1-2, 2 tabs poqday 3-4, 1 tab poqday 5-6  Dispense: 12 tablet; Refill: 0 - azithromycin (ZITHROMAX) 250 MG tablet; 2 tabs poqday1, 1 tab poqday 2-5  Dispense: 6 tablet; Refill: 0

## 2013-12-24 ENCOUNTER — Telehealth: Payer: Self-pay | Admitting: Family Medicine

## 2013-12-24 NOTE — Telephone Encounter (Signed)
Last Rf 8/24 #60.  LOV 8/24  OK refill?

## 2013-12-24 NOTE — Telephone Encounter (Signed)
Patient is calling for her hydrocodone  [phone]

## 2013-12-27 MED ORDER — HYDROCODONE-ACETAMINOPHEN 5-325 MG PO TABS: 1.0000 | ORAL_TABLET | Freq: Four times a day (QID) | ORAL | Status: DC | PRN

## 2013-12-27 NOTE — Telephone Encounter (Signed)
Rx printed and pt made aware ready for pick up

## 2013-12-27 NOTE — Telephone Encounter (Signed)
ok 

## 2014-01-17 ENCOUNTER — Other Ambulatory Visit: Payer: Self-pay | Admitting: Family Medicine

## 2014-01-17 ENCOUNTER — Ambulatory Visit (INDEPENDENT_AMBULATORY_CARE_PROVIDER_SITE_OTHER): Payer: Medicare Other | Admitting: *Deleted

## 2014-01-17 DIAGNOSIS — Z23 Encounter for immunization: Secondary | ICD-10-CM

## 2014-01-17 MED ORDER — TRIAMCINOLONE ACETONIDE 0.1 % EX OINT: 1.0000 "application " | TOPICAL_OINTMENT | Freq: Two times a day (BID) | CUTANEOUS | Status: DC

## 2014-01-17 NOTE — Progress Notes (Signed)
Patient ID: Christina Lyons, female   DOB: 13-Jul-1940, 73 y.o.   MRN: [id number] Patient seen in office for Influenza Vaccination.   Tolerated IM administration well.

## 2014-01-17 NOTE — Progress Notes (Signed)
Pt here with her friend, she is a patient ofours. Past 3 days noticed a red itchy rash on her right arm, thought she may have had a bug bite, no blisters noted No fever, feels fine otherwise, used OTC bacitracin but it expired 2 years ago  4 small erythematous lesions with scabs , no indurated, no drainage noted  Will RX TAC cream, have her continue to monitor for any changes

## 2014-03-01 ENCOUNTER — Other Ambulatory Visit: Payer: Self-pay | Admitting: Family Medicine

## 2014-03-01 ENCOUNTER — Ambulatory Visit (INDEPENDENT_AMBULATORY_CARE_PROVIDER_SITE_OTHER): Payer: Medicare Other | Admitting: Family Medicine

## 2014-03-01 DIAGNOSIS — Z23 Encounter for immunization: Secondary | ICD-10-CM

## 2014-03-01 MED ORDER — HYDROCODONE-ACETAMINOPHEN 5-325 MG PO TABS: 1.0000 | ORAL_TABLET | Freq: Four times a day (QID) | ORAL | Status: DC | PRN

## 2014-03-01 MED ORDER — LORAZEPAM 1 MG PO TABS: ORAL_TABLET | ORAL | Status: DC

## 2014-03-01 NOTE — Telephone Encounter (Signed)
Last Rf Lorazepam 8/24 #60 + 2.  Last RF Hydrocodone 9/38 #60.  Refills approved by Dr Dennard Schaumann and printed out for patient who is here to pick up.

## 2014-03-01 NOTE — Telephone Encounter (Signed)
[phone] LORazepam (ATIVAN) 1 MG tablet PT is needing a refill on HYDROcodone-acetaminophen (NORCO/VICODIN) 5-325 MG per tablet and

## 2014-06-10 ENCOUNTER — Other Ambulatory Visit: Payer: Self-pay | Admitting: Family Medicine

## 2014-06-10 NOTE — Telephone Encounter (Signed)
ok 

## 2014-06-10 NOTE — Telephone Encounter (Signed)
Medication refilled per protocol. 

## 2014-06-10 NOTE — Telephone Encounter (Signed)
Ok to refill 

## 2014-06-27 ENCOUNTER — Encounter: Payer: Self-pay | Admitting: Family Medicine

## 2014-07-08 ENCOUNTER — Other Ambulatory Visit: Payer: Self-pay | Admitting: Family Medicine

## 2014-07-08 NOTE — Telephone Encounter (Signed)
ok 

## 2014-07-08 NOTE — Telephone Encounter (Signed)
LRF 03/01/14  #60 + 2.  LOV 11/22/13  OK refill?

## 2014-07-08 NOTE — Telephone Encounter (Signed)
rx called in

## 2014-07-30 ENCOUNTER — Other Ambulatory Visit: Payer: Self-pay | Admitting: Family Medicine

## 2014-09-05 ENCOUNTER — Other Ambulatory Visit: Payer: Self-pay | Admitting: Family Medicine

## 2014-09-06 NOTE — Telephone Encounter (Signed)
?   OK to Refill  

## 2014-09-06 NOTE — Telephone Encounter (Signed)
ok 

## 2014-10-13 ENCOUNTER — Encounter: Payer: Self-pay | Admitting: Family Medicine

## 2014-10-13 ENCOUNTER — Other Ambulatory Visit: Payer: Self-pay | Admitting: Family Medicine

## 2014-10-13 NOTE — Telephone Encounter (Signed)
Medication refill for one time only.  Patient needs to be seen.  Letter sent for patient to call and schedule 

## 2014-10-28 ENCOUNTER — Ambulatory Visit (INDEPENDENT_AMBULATORY_CARE_PROVIDER_SITE_OTHER): Payer: PPO | Admitting: Family Medicine

## 2014-10-28 ENCOUNTER — Encounter: Payer: Self-pay | Admitting: Family Medicine

## 2014-10-28 VITALS — BP 104/80 | HR 78 | Temp 97.8°F | Resp 18 | Wt 205.0 lb

## 2014-10-28 DIAGNOSIS — M8580 Other specified disorders of bone density and structure, unspecified site: Secondary | ICD-10-CM

## 2014-10-28 DIAGNOSIS — I10 Essential (primary) hypertension: Secondary | ICD-10-CM | POA: Diagnosis not present

## 2014-10-28 DIAGNOSIS — M5136 Other intervertebral disc degeneration, lumbar region: Secondary | ICD-10-CM | POA: Diagnosis not present

## 2014-10-28 DIAGNOSIS — M4125 Other idiopathic scoliosis, thoracolumbar region: Secondary | ICD-10-CM | POA: Diagnosis not present

## 2014-10-28 LAB — CBC WITH DIFFERENTIAL/PLATELET
Basophils Absolute: 0.1 10*3/uL (ref 0.0–0.1)
Basophils Relative: 1 % (ref 0–1)
Eosinophils Absolute: 0.2 10*3/uL (ref 0.0–0.7)
Eosinophils Relative: 3 % (ref 0–5)
HCT: 41.6 % (ref 36.0–46.0)
HEMOGLOBIN: 14.1 g/dL (ref 12.0–15.0)
Lymphocytes Relative: 30 % (ref 12–46)
Lymphs Abs: 1.6 10*3/uL (ref 0.7–4.0)
MCH: 33.7 pg (ref 26.0–34.0)
MCHC: 33.9 g/dL (ref 30.0–36.0)
MCV: 99.5 fL (ref 78.0–100.0)
MPV: 9.9 fL (ref 8.6–12.4)
Monocytes Absolute: 0.5 10*3/uL (ref 0.1–1.0)
Monocytes Relative: 9 % (ref 3–12)
NEUTROS ABS: 3 10*3/uL (ref 1.7–7.7)
Neutrophils Relative %: 57 % (ref 43–77)
PLATELETS: 188 10*3/uL (ref 150–400)
RBC: 4.18 MIL/uL (ref 3.87–5.11)
RDW: 13.1 % (ref 11.5–15.5)
WBC: 5.2 10*3/uL (ref 4.0–10.5)

## 2014-10-28 MED ORDER — ALENDRONATE SODIUM 70 MG PO TABS: 70.0000 mg | ORAL_TABLET | ORAL | Status: DC

## 2014-10-28 NOTE — Progress Notes (Signed)
Subjective:    Patient ID: Christina Lyons, female    DOB: 20-Aug-1940, 74 y.o.   MRN: [id number]  HPI A she is a very pleasant 74 year old white female who presents today for follow-up of her blood pressure. She is currently on lisinopril 10 mg by mouth daily. She denies any chest pain shortness of breath or dyspnea on exertion. She has recently retired. Since retiring she is better able to control her back pain which stems from her lumbar degenerative disc disease and her thoracolumbar scoliosis. She uses pain medication maybe once or twice a week. Otherwise she tends to rest. She is taking Evista for her borderline osteoporosis. Her T score was -2.4 when last checked. I would like to switch the patient to Fosamax given her age, her blood pressure, and her hyperlipidemia. The patient is willing to try this. Past Medical History  Diagnosis Date  . Osteomyelitis 1946    both legs  . Hyperlipidemia   . Hypertension   . Other idiopathic scoliosis, thoracolumbar region   . Osteopenia   . GERD (gastroesophageal reflux disease)   . DDD (degenerative disc disease), lumbar    Past Surgical History  Procedure Laterality Date  . Abdominal hysterectomy    . Cholecystectomy     Current Outpatient Prescriptions on File Prior to Visit  Medication Sig Dispense Refill  . albuterol (PROVENTIL HFA;VENTOLIN HFA) 108 (90 BASE) MCG/ACT inhaler Inhale into the lungs every 6 (six) hours as needed for wheezing or shortness of breath.    Marland Kitchen HYDROcodone-acetaminophen (NORCO/VICODIN) 5-325 MG per tablet Take 1 tablet by mouth every 6 (six) hours as needed. 60 tablet 0  . lisinopril (PRINIVIL,ZESTRIL) 10 MG tablet TAKE ONE TABLET BY MOUTH ONCE DAILY 30 tablet 0  . LORazepam (ATIVAN) 1 MG tablet TAKE ONE TABLET BY MOUTH TWICE DAILY AS NEEDED 60 tablet 0  . omeprazole (PRILOSEC) 20 MG capsule TAKE 1 CAPSULE TWICE DAILY 60 capsule 11  . predniSONE (DELTASONE) 20 MG tablet 3 tabs poqday 1-2, 2 tabs poqday 3-4, 1 tab  poqday 5-6 12 tablet 0  . promethazine (PHENERGAN) 25 MG tablet TAKE 1 TABLET (25 MG TOTAL) BY MOUTH EVERY 8 (EIGHT) HOURS AS NEEDED FOR NAUSEA OR VOMITING. 20 tablet 0  . raloxifene (EVISTA) 60 MG tablet TAKE ONE TABLET BY MOUTH ONCE DAILY 30 tablet 11  . triamcinolone ointment (KENALOG) 0.1 % Apply 1 application topically 2 (two) times daily. 30 g 0   No current facility-administered medications on file prior to visit.   Allergies  Allergen Reactions  . Sulfa Antibiotics Hives   History   Social History  . Marital Status: Widowed    Spouse Name: N/A  . Number of Children: N/A  . Years of Education: N/A   Occupational History  . Not on file.   Social History Main Topics  . Smoking status: Former Research scientist (life sciences)  . Smokeless tobacco: Not on file  . Alcohol Use: No  . Drug Use: No  . Sexual Activity: Not on file   Other Topics Concern  . Not on file   Social History Narrative  . No narrative on file     Review of Systems  All other systems reviewed and are negative.      Objective:   Physical Exam  Constitutional: She appears well-developed and well-nourished. No distress.  Neck: Neck supple. No JVD present. No thyromegaly present.  Cardiovascular: Normal rate, regular rhythm, normal heart sounds and intact distal pulses.  Exam reveals no gallop  and no friction rub.   No murmur heard. Pulmonary/Chest: Effort normal and breath sounds normal. No respiratory distress. She has no wheezes. She has no rales. She exhibits no tenderness.  Abdominal: Soft. Bowel sounds are normal. She exhibits no distension. There is no tenderness. There is no rebound and no guarding.  Musculoskeletal: She exhibits no edema.  Lymphadenopathy:    She has no cervical adenopathy.  Skin: She is not diaphoretic.  Vitals reviewed.         Assessment & Plan:  Essential hypertension  Other idiopathic scoliosis, thoracolumbar region  DDD (degenerative disc disease), lumbar  Patient's blood  pressure is well controlled. Continue lisinopril. I will check a fasting lipid panel. Goal LDL cholesterol is less than 130. I recommended that she stop Evista given the increased risk of stroke and begin Fosamax 70 mg by mouth every week for her borderline osteoporosis. She can continue to take the pain medication as needed for her back but fortunately this is improving since she is retired. I did recommend diet aerobic exercise such as a rowing machine or swimming and weight loss

## 2014-10-29 LAB — LIPID PANEL
CHOL/HDL RATIO: 2.9 ratio (ref <=5.0)
CHOLESTEROL: 213 mg/dL — AB (ref 125–200)
HDL: 73 mg/dL (ref >=46)
LDL Cholesterol: 127 mg/dL (ref <130)
Triglycerides: 64 mg/dL (ref <150)
VLDL: 13 mg/dL (ref <30)

## 2014-10-29 LAB — COMPLETE METABOLIC PANEL WITH GFR
ALT: 16 U/L (ref 6–29)
AST: 17 U/L (ref 10–35)
Albumin: 4 g/dL (ref 3.6–5.1)
Alkaline Phosphatase: 60 U/L (ref 33–130)
BILIRUBIN TOTAL: 0.5 mg/dL (ref 0.2–1.2)
BUN: 17 mg/dL (ref 7–25)
CHLORIDE: 106 mmol/L (ref 98–110)
CO2: 25 mmol/L (ref 20–31)
CREATININE: 0.64 mg/dL (ref 0.60–0.93)
Calcium: 9.1 mg/dL (ref 8.6–10.4)
GFR, Est African American: >89 mL/min (ref >=60)
GFR, Est Non African American: >89 mL/min (ref >=60)
Glucose, Bld: 88 mg/dL (ref 70–99)
Potassium: 4.1 mmol/L (ref 3.5–5.3)
Sodium: 142 mmol/L (ref 135–146)
TOTAL PROTEIN: 6.3 g/dL (ref 6.1–8.1)

## 2014-11-10 ENCOUNTER — Encounter: Payer: Self-pay | Admitting: Family Medicine

## 2014-11-14 ENCOUNTER — Other Ambulatory Visit: Payer: Self-pay | Admitting: Family Medicine

## 2014-11-14 NOTE — Telephone Encounter (Signed)
Refill appropriate and filled per protocol. 

## 2014-11-16 ENCOUNTER — Other Ambulatory Visit: Payer: Self-pay | Admitting: Family Medicine

## 2014-11-16 NOTE — Telephone Encounter (Signed)
?   OK to Refill  

## 2014-11-17 NOTE — Telephone Encounter (Signed)
ok 

## 2014-11-21 ENCOUNTER — Telehealth: Payer: Self-pay | Admitting: Family Medicine

## 2014-11-21 NOTE — Telephone Encounter (Signed)
Spoke to pt and she has had severe congestion and HA, she has been using Flonase but nothing else and would like to know what else she could use for it? In the past she was given antibx and pred. She will come in if she needs to.

## 2014-11-21 NOTE — Telephone Encounter (Signed)
Patient calling regarding still having sinus issues please call her back at [phone]

## 2014-11-22 NOTE — Telephone Encounter (Signed)
If it has been less than 1 week, give it time.  90% of sinus infections improve after 7-10 days regardless of abx.  If it lasts longer than 10 days, NTBS.

## 2014-11-22 NOTE — Telephone Encounter (Signed)
It has been since 10/28/14 - recommended she schedule appt. - aware via vm

## 2014-11-25 ENCOUNTER — Encounter: Payer: Self-pay | Admitting: Family Medicine

## 2014-11-25 ENCOUNTER — Ambulatory Visit (INDEPENDENT_AMBULATORY_CARE_PROVIDER_SITE_OTHER): Payer: PPO | Admitting: Family Medicine

## 2014-11-25 VITALS — BP 110/68 | HR 76 | Temp 98.5°F | Resp 16 | Ht 60.0 in | Wt 204.0 lb

## 2014-11-25 DIAGNOSIS — J019 Acute sinusitis, unspecified: Secondary | ICD-10-CM | POA: Diagnosis not present

## 2014-11-25 MED ORDER — PREDNISONE 20 MG PO TABS: ORAL_TABLET | ORAL | Status: DC

## 2014-11-25 MED ORDER — AMOXICILLIN 875 MG PO TABS: 875.0000 mg | ORAL_TABLET | Freq: Two times a day (BID) | ORAL | Status: DC

## 2014-11-25 NOTE — Progress Notes (Signed)
Subjective:    Patient ID: Christina Lyons, female    DOB: June 08, 1940, 74 y.o.   MRN: [id number]  HPI Patient reports a one-week history of congestion, pain and pressure in her frontal and maxillary sinuses, sinus headache, postnasal drip, and mild pain in her upper teeth. She is tried over-the-counter nasal saline, Flonase, and Coricidin HBP without relief. She is waiting 1 week with no benefit. She is here today for further assessment Past Medical History  Diagnosis Date  . Osteomyelitis 1946    both legs  . Hyperlipidemia   . Hypertension   . Other idiopathic scoliosis, thoracolumbar region   . Osteopenia   . GERD (gastroesophageal reflux disease)   . DDD (degenerative disc disease), lumbar    Past Surgical History  Procedure Laterality Date  . Abdominal hysterectomy    . Cholecystectomy     Current Outpatient Prescriptions on File Prior to Visit  Medication Sig Dispense Refill  . albuterol (PROVENTIL HFA;VENTOLIN HFA) 108 (90 BASE) MCG/ACT inhaler Inhale into the lungs every 6 (six) hours as needed for wheezing or shortness of breath.    Marland Kitchen alendronate (FOSAMAX) 70 MG tablet Take 1 tablet (70 mg total) by mouth every 7 (seven) days. Take with a full glass of water on an empty stomach. 4 tablet 11  . HYDROcodone-acetaminophen (NORCO/VICODIN) 5-325 MG per tablet Take 1 tablet by mouth every 6 (six) hours as needed. 60 tablet 0  . lisinopril (PRINIVIL,ZESTRIL) 10 MG tablet Take 1 tablet (10 mg total) by mouth daily. 30 tablet 6  . LORazepam (ATIVAN) 1 MG tablet TAKE ONE TABLET BY MOUTH TWICE DAILY AS NEEDED 60 tablet 2  . omeprazole (PRILOSEC) 20 MG capsule TAKE ONE CAPSULE BY MOUTH TWICE DAILY 60 capsule 11  . promethazine (PHENERGAN) 25 MG tablet TAKE 1 TABLET (25 MG TOTAL) BY MOUTH EVERY 8 (EIGHT) HOURS AS NEEDED FOR NAUSEA OR VOMITING. 20 tablet 0  . raloxifene (EVISTA) 60 MG tablet TAKE ONE TABLET BY MOUTH ONCE DAILY 30 tablet 11  . triamcinolone ointment (KENALOG) 0.1 % Apply  1 application topically 2 (two) times daily. 30 g 0   No current facility-administered medications on file prior to visit.   Allergies  Allergen Reactions  . Sulfa Antibiotics Hives   Social History   Social History  . Marital Status: Widowed    Spouse Name: N/A  . Number of Children: N/A  . Years of Education: N/A   Occupational History  . Not on file.   Social History Main Topics  . Smoking status: Former Research scientist (life sciences)  . Smokeless tobacco: Not on file  . Alcohol Use: No  . Drug Use: No  . Sexual Activity: Not on file   Other Topics Concern  . Not on file   Social History Narrative      Review of Systems  All other systems reviewed and are negative.      Objective:   Physical Exam  Constitutional: She appears well-developed and well-nourished. No distress.  HENT:  Right Ear: External ear normal.  Left Ear: External ear normal.  Nose: Mucosal edema present. No rhinorrhea. Right sinus exhibits maxillary sinus tenderness and frontal sinus tenderness. Left sinus exhibits maxillary sinus tenderness and frontal sinus tenderness.  Mouth/Throat: Oropharynx is clear and moist. No oropharyngeal exudate.  Neck: Neck supple.  Cardiovascular: Normal rate, regular rhythm and normal heart sounds.   Pulmonary/Chest: Effort normal and breath sounds normal. No respiratory distress. She has no wheezes. She has no rales.  Lymphadenopathy:    She has no cervical adenopathy.  Skin: She is not diaphoretic.  Vitals reviewed.         Assessment & Plan:  Acute rhinosinusitis - Plan: amoxicillin (AMOXIL) 875 MG tablet, predniSONE (DELTASONE) 20 MG tablet  Patient has developed secondary bacterial sinusitis. Begin amoxicillin 875 mg by mouth twice a day for 10 days. Add prednisone taper pack due to the mucosal swelling and eustachian tube dysfunction.

## 2014-12-15 ENCOUNTER — Encounter: Payer: Self-pay | Admitting: Family Medicine

## 2014-12-15 ENCOUNTER — Ambulatory Visit (INDEPENDENT_AMBULATORY_CARE_PROVIDER_SITE_OTHER): Payer: PPO | Admitting: Family Medicine

## 2014-12-15 VITALS — BP 110/78 | HR 74 | Temp 98.6°F | Resp 18 | Ht 60.0 in | Wt 204.0 lb

## 2014-12-15 DIAGNOSIS — J0101 Acute recurrent maxillary sinusitis: Secondary | ICD-10-CM | POA: Diagnosis not present

## 2014-12-15 NOTE — Progress Notes (Signed)
Subjective:    Patient ID: Christina Lyons, female    DOB: [birth year], 74 y.o.   MRN: [id number]  HPI 11/25/14 Patient reports a one-week history of congestion, pain and pressure in her frontal and maxillary sinuses, sinus headache, postnasal drip, and mild pain in her upper teeth. She is tried over-the-counter nasal saline, Flonase, and Coricidin HBP without relief. She is waiting 1 week with no benefit. She is here today for further assessment.  At that time, my plan was: Patient has developed secondary bacterial sinusitis. Begin amoxicillin 875 mg by mouth twice a day for 10 days. Add prednisone taper pack due to the mucosal swelling and eustachian tube dysfunction.  12/15/14 Patient never took the prednisone the amoxicillin. Symptoms almost completely resolve spontaneously on the right. However this week they have returned with a vengeance. She is now having severe pain and pressure in her maxillary sinuses and in her frontal sinuses. She has bilateral eustachian tube dysfunction. She has bilateral teeth pain head congestion, ear pain and pressure and postnasal drip. Past Medical History  Diagnosis Date  . Osteomyelitis 1946    both legs  . Hyperlipidemia   . Hypertension   . Other idiopathic scoliosis, thoracolumbar region   . Osteopenia   . GERD (gastroesophageal reflux disease)   . DDD (degenerative disc disease), lumbar    Past Surgical History  Procedure Laterality Date  . Abdominal hysterectomy    . Cholecystectomy     Current Outpatient Prescriptions on File Prior to Visit  Medication Sig Dispense Refill  . albuterol (PROVENTIL HFA;VENTOLIN HFA) 108 (90 BASE) MCG/ACT inhaler Inhale into the lungs every 6 (six) hours as needed for wheezing or shortness of breath.    Marland Kitchen alendronate (FOSAMAX) 70 MG tablet Take 1 tablet (70 mg total) by mouth every 7 (seven) days. Take with a full glass of water on an empty stomach. 4 tablet 11  . amoxicillin (AMOXIL) 875 MG tablet Take 1 tablet  (875 mg total) by mouth 2 (two) times daily. 20 tablet 0  . HYDROcodone-acetaminophen (NORCO/VICODIN) 5-325 MG per tablet Take 1 tablet by mouth every 6 (six) hours as needed. 60 tablet 0  . lisinopril (PRINIVIL,ZESTRIL) 10 MG tablet Take 1 tablet (10 mg total) by mouth daily. 30 tablet 6  . LORazepam (ATIVAN) 1 MG tablet TAKE ONE TABLET BY MOUTH TWICE DAILY AS NEEDED 60 tablet 2  . omeprazole (PRILOSEC) 20 MG capsule TAKE ONE CAPSULE BY MOUTH TWICE DAILY 60 capsule 11  . promethazine (PHENERGAN) 25 MG tablet TAKE 1 TABLET (25 MG TOTAL) BY MOUTH EVERY 8 (EIGHT) HOURS AS NEEDED FOR NAUSEA OR VOMITING. 20 tablet 0  . raloxifene (EVISTA) 60 MG tablet TAKE ONE TABLET BY MOUTH ONCE DAILY 30 tablet 11  . triamcinolone ointment (KENALOG) 0.1 % Apply 1 application topically 2 (two) times daily. 30 g 0   No current facility-administered medications on file prior to visit.   Allergies  Allergen Reactions  . Sulfa Antibiotics Hives   Social History   Social History  . Marital Status: Widowed    Spouse Name: N/A  . Number of Children: N/A  . Years of Education: N/A   Occupational History  . Not on file.   Social History Main Topics  . Smoking status: Former Research scientist (life sciences)  . Smokeless tobacco: Not on file  . Alcohol Use: No  . Drug Use: No  . Sexual Activity: Not on file   Other Topics Concern  . Not on file  Social History Narrative      Review of Systems  All other systems reviewed and are negative.      Objective:   Physical Exam  Constitutional: She appears well-developed and well-nourished. No distress.  HENT:  Right Ear: External ear normal.  Left Ear: External ear normal.  Nose: Mucosal edema present. No rhinorrhea. Right sinus exhibits maxillary sinus tenderness and frontal sinus tenderness. Left sinus exhibits maxillary sinus tenderness and frontal sinus tenderness.  Mouth/Throat: Oropharynx is clear and moist. No oropharyngeal exudate.  Neck: Neck supple.    Cardiovascular: Normal rate, regular rhythm and normal heart sounds.   Pulmonary/Chest: Effort normal and breath sounds normal. No respiratory distress. She has no wheezes. She has no rales.  Lymphadenopathy:    She has no cervical adenopathy.  Skin: She is not diaphoretic.  Vitals reviewed.         Assessment & Plan:  Recurrent maxillary sinusitis, unspecified chronicity  I am concerned the patient sinusitis may have been smoldering for the last few weeks. Symptomatically he may improve with some the measures we took however I believe the infection has never truly gone away. Therefore I've asked the patient to take the prednisone taper pack and the amoxicillin that was previously prescribed. Recheck in 10 days if not 100% better

## 2014-12-21 ENCOUNTER — Telehealth: Payer: Self-pay | Admitting: Family Medicine

## 2014-12-21 DIAGNOSIS — J328 Other chronic sinusitis: Secondary | ICD-10-CM

## 2014-12-21 NOTE — Telephone Encounter (Signed)
Patient calling to say that her meds that were prescribed for her sinus infection are not working [phone]

## 2014-12-21 NOTE — Telephone Encounter (Signed)
Pt aware via vm to continue taking meds until she received a call back from [place] tomorrow and informed wtp not here today.

## 2014-12-22 ENCOUNTER — Telehealth: Payer: Self-pay | Admitting: *Deleted

## 2014-12-22 NOTE — Telephone Encounter (Signed)
Submitted [employer] referral thru acuity connect for authorization to [employer] with authorization number [id number]  Requesting provider: Flonnie Hailstone  Treating provider: [facility]  Number of visits:1  Start Date: 12/22/14  End Date: 06/20/14  Dx:J32.8-other chronic sinusitis  PCP: Christina Gash Pickard,MD  Type of service: CT  Procedures: 33435-[place] maxillofacial w/o dye

## 2014-12-22 NOTE — Telephone Encounter (Signed)
Pt aware via vm and referral placed for CT scan

## 2014-12-22 NOTE — Telephone Encounter (Signed)
Proceed with CT of the sinuses to determine if she needs ENT

## 2014-12-26 ENCOUNTER — Ambulatory Visit
Admission: RE | Admit: 2014-12-26 | Discharge: 2014-12-26 | Disposition: A | Discharge status: Home / Self Care | Payer: PPO | Source: Ambulatory Visit | Attending: Family Medicine | Admitting: Family Medicine

## 2014-12-26 DIAGNOSIS — J328 Other chronic sinusitis: Secondary | ICD-10-CM

## 2015-01-03 ENCOUNTER — Telehealth: Payer: Self-pay | Admitting: Family Medicine

## 2015-01-03 MED ORDER — HYDROCODONE-ACETAMINOPHEN 5-325 MG PO TABS: 1.0000 | ORAL_TABLET | Freq: Four times a day (QID) | ORAL | Status: DC | PRN

## 2015-01-03 NOTE — Telephone Encounter (Signed)
?   OK to Refill  

## 2015-01-03 NOTE — Telephone Encounter (Signed)
Patient calling to get rx for her hydrocodone  [phone]

## 2015-01-03 NOTE — Telephone Encounter (Signed)
ok 

## 2015-01-03 NOTE — Telephone Encounter (Signed)
RX printed, left up front and patient aware to pick up  

## 2015-01-11 ENCOUNTER — Ambulatory Visit (INDEPENDENT_AMBULATORY_CARE_PROVIDER_SITE_OTHER): Payer: PPO | Admitting: *Deleted

## 2015-01-11 DIAGNOSIS — Z23 Encounter for immunization: Secondary | ICD-10-CM

## 2015-01-16 ENCOUNTER — Encounter: Payer: Self-pay | Admitting: Family Medicine

## 2015-01-16 ENCOUNTER — Ambulatory Visit (INDEPENDENT_AMBULATORY_CARE_PROVIDER_SITE_OTHER): Payer: PPO | Admitting: Family Medicine

## 2015-01-16 VITALS — BP 124/72 | HR 78 | Temp 97.6°F | Resp 18 | Ht 60.0 in | Wt 208.0 lb

## 2015-01-16 DIAGNOSIS — R300 Dysuria: Secondary | ICD-10-CM | POA: Diagnosis not present

## 2015-01-16 LAB — URINALYSIS, ROUTINE W REFLEX MICROSCOPIC
Bilirubin Urine: NEGATIVE
Glucose, UA: NEGATIVE
HGB URINE DIPSTICK: NEGATIVE
KETONES UR: NEGATIVE
Leukocytes, UA: NEGATIVE
NITRITE: NEGATIVE
Protein, ur: NEGATIVE
Specific Gravity, Urine: 1.015 (ref 1.001–1.035)
pH: 6.5 (ref 5.0–8.0)

## 2015-01-16 MED ORDER — CIPROFLOXACIN HCL 500 MG PO TABS: 500.0000 mg | ORAL_TABLET | Freq: Two times a day (BID) | ORAL | Status: DC

## 2015-01-16 NOTE — Progress Notes (Signed)
Patient ID: Christina Lyons, female   DOB: 01/09/1941, 74 y.o.   MRN: [id number]   Subjective:    Patient ID: Christina Lyons, female    DOB: 02-11-1941, 74 y.o.   MRN: [id number]  Patient presents for Dysuria  Pt here with dysuria, left flank pain, LLQ pain for the past 2 days, used pain medication and heating pad which helped. Has also had urinary frequency. Denies any changes in bowels, gas or bloating. No fever, no N/V, no injury    Review Of Systems:  GEN- denies fatigue, fever, weight loss,weakness, recent illness HEENT- denies eye drainage, change in vision, nasal discharge, CVS- denies chest pain, palpitations RESP- denies SOB, cough, wheeze ABD- denies N/V, change in stools, abd pain GU- + dysuria, hematuria, dribbling, incontinence Neuro- denies headache, dizziness, syncope, seizure activity       Objective:    BP 124/72 mmHg  Pulse 78  Temp(Src) 97.6 F (36.4 C) (Oral)  Resp 18  Ht 5' (1.524 m)  Wt 208 lb (94.348 kg)  BMI 40.62 kg/m2 GEN- NAD, alert and oriented x3,well appearing  HEENT- PERRL, EOMI, non injected sclera, pink conjunctiva, MMM, oropharynx clear CVS- RRR, no murmur RESP-CTAB ABD-NABS,soft, TTP LLQ, suprapubuic region, no CVA tenderness, no rebound, no guarding  EXT- No edema Pulses- Radial- 2+        Assessment & Plan:      Problem List Items Addressed This Visit    None    Visit Diagnoses    Dysuria    -  Primary    Based on symptoms,s tart Cipro, send Urine for culture, recently on amoxicillin, so overt signs of diverticulitis, no bowel changes, MSK also possible cause of flank pain    Relevant Orders    Urinalysis, Routine w reflex microscopic (not at [facility]) (Completed)    Urine culture       Note: This dictation was prepared with Dragon dictation along with smaller phrase technology. Any transcriptional errors that result from this process are unintentional.

## 2015-01-16 NOTE — Patient Instructions (Signed)
Start antibiotics as prescribed We will call with culture results F/U as needed

## 2015-01-20 ENCOUNTER — Telehealth: Payer: Self-pay | Admitting: Family Medicine

## 2015-01-20 NOTE — Telephone Encounter (Signed)
Pt is calling to check on the results from her urine culture. [phone]

## 2015-01-23 NOTE — Telephone Encounter (Signed)
See if she still hs symptoms, looks like lab overlooked and did not send Culture? I gave Cipro for a few days

## 2015-01-23 NOTE — Telephone Encounter (Signed)
There was no culture done - do you want pt to come back?

## 2015-01-24 NOTE — Telephone Encounter (Signed)
Pt aware of below and reports no symptoms at this time. Pt informed that if symptoms return to come in for a culture - no appt needed.

## 2015-01-24 NOTE — Telephone Encounter (Signed)
LMTRC

## 2015-03-17 ENCOUNTER — Other Ambulatory Visit: Payer: Self-pay | Admitting: Family Medicine

## 2015-03-17 NOTE — Telephone Encounter (Signed)
?   OK to Refill  

## 2015-03-17 NOTE — Telephone Encounter (Signed)
ok 

## 2015-03-17 NOTE — Telephone Encounter (Signed)
Medication refilled per protocol. 

## 2015-04-07 ENCOUNTER — Other Ambulatory Visit: Payer: Self-pay | Admitting: Family Medicine

## 2015-04-07 MED ORDER — HYDROCODONE-ACETAMINOPHEN 5-325 MG PO TABS: 1.0000 | ORAL_TABLET | Freq: Four times a day (QID) | ORAL | Status: DC | PRN

## 2015-04-07 NOTE — Telephone Encounter (Signed)
rx printed and pt here for pick up 

## 2015-04-07 NOTE — Telephone Encounter (Signed)
Patient calling requesting a refill  HYDROcodone-acetaminophen (NORCO/VICODIN) 5-325 MG She would like her sister Felicity Coyer to pick up when she picks up her prescription today.

## 2015-04-07 NOTE — Telephone Encounter (Signed)
?   OK to Refill  

## 2015-04-07 NOTE — Telephone Encounter (Signed)
ok 

## 2015-05-09 ENCOUNTER — Ambulatory Visit (INDEPENDENT_AMBULATORY_CARE_PROVIDER_SITE_OTHER): Payer: PPO | Admitting: Family Medicine

## 2015-05-09 ENCOUNTER — Ambulatory Visit
Admission: RE | Admit: 2015-05-09 | Discharge: 2015-05-09 | Disposition: A | Discharge status: Home / Self Care | Payer: PPO | Source: Ambulatory Visit | Attending: Family Medicine | Admitting: Family Medicine

## 2015-05-09 ENCOUNTER — Encounter: Payer: Self-pay | Admitting: Family Medicine

## 2015-05-09 VITALS — BP 132/84 | HR 68 | Temp 98.1°F | Resp 18 | Wt 210.0 lb

## 2015-05-09 DIAGNOSIS — M47816 Spondylosis without myelopathy or radiculopathy, lumbar region: Secondary | ICD-10-CM | POA: Diagnosis not present

## 2015-05-09 DIAGNOSIS — M25551 Pain in right hip: Secondary | ICD-10-CM

## 2015-05-09 LAB — CBC WITH DIFFERENTIAL/PLATELET
BASOS ABS: 0 10*3/uL (ref 0.0–0.1)
Basophils Relative: 0 % (ref 0–1)
Eosinophils Absolute: 0.2 10*3/uL (ref 0.0–0.7)
Eosinophils Relative: 3 % (ref 0–5)
HEMATOCRIT: 41.6 % (ref 36.0–46.0)
HEMOGLOBIN: 13.7 g/dL (ref 12.0–15.0)
LYMPHS ABS: 1.4 10*3/uL (ref 0.7–4.0)
LYMPHS PCT: 22 % (ref 12–46)
MCH: 33.2 pg (ref 26.0–34.0)
MCHC: 32.9 g/dL (ref 30.0–36.0)
MCV: 100.7 fL — ABNORMAL HIGH (ref 78.0–100.0)
MPV: 10.1 fL (ref 8.6–12.4)
Monocytes Absolute: 0.4 10*3/uL (ref 0.1–1.0)
Monocytes Relative: 7 % (ref 3–12)
NEUTROS ABS: 4.2 10*3/uL (ref 1.7–7.7)
Neutrophils Relative %: 68 % (ref 43–77)
Platelets: 187 10*3/uL (ref 150–400)
RBC: 4.13 MIL/uL (ref 3.87–5.11)
RDW: 13 % (ref 11.5–15.5)
WBC: 6.2 10*3/uL (ref 4.0–10.5)

## 2015-05-09 NOTE — Progress Notes (Signed)
Subjective:    Patient ID: Christina Lyons, female    DOB: 12/11/1940, 75 y.o.   MRN: [id number]  HPI Patient symptoms began on January 20. That entire day, she was sitting in a lounge chair. She is constantly having to walk herself to get out of the chair and forcefully flex her knees to kick in the foot rest.  The following morning she developed a pain deep inside her right hip. The pain is worsened by hip flexion. She also complains of pain in her right buttocks in her right lower back. This pain is worsened with standing and walking and bending over. She also complains of occasional numbness in the anterior portion of her right leg down to her right knee. She denies any fever or weight loss. She denies any falls or injuries. She denies any nausea vomiting diarrhea or constipation. She denies any blood in her stool. She denies any dysuria or hematuria Past Medical History  Diagnosis Date  . Osteomyelitis ([place]) 1946    both legs  . Hyperlipidemia   . Hypertension   . Other idiopathic scoliosis, thoracolumbar region   . Osteopenia   . GERD (gastroesophageal reflux disease)   . DDD (degenerative disc disease), lumbar    Past Surgical History  Procedure Laterality Date  . Abdominal hysterectomy    . Cholecystectomy     Current Outpatient Prescriptions on File Prior to Visit  Medication Sig Dispense Refill  . albuterol (PROVENTIL HFA;VENTOLIN HFA) 108 (90 BASE) MCG/ACT inhaler Inhale into the lungs every 6 (six) hours as needed for wheezing or shortness of breath.    Marland Kitchen alendronate (FOSAMAX) 70 MG tablet Take 1 tablet (70 mg total) by mouth every 7 (seven) days. Take with a full glass of water on an empty stomach. 4 tablet 11  . HYDROcodone-acetaminophen (NORCO/VICODIN) 5-325 MG tablet Take 1 tablet by mouth every 6 (six) hours as needed. 60 tablet 0  . lisinopril (PRINIVIL,ZESTRIL) 10 MG tablet Take 1 tablet (10 mg total) by mouth daily. 30 tablet 6  . LORazepam (ATIVAN) 1 MG tablet TAKE  ONE TABLET BY MOUTH TWICE DAILY AS NEEDED. 60 tablet 2  . omeprazole (PRILOSEC) 20 MG capsule TAKE ONE CAPSULE BY MOUTH TWICE DAILY 60 capsule 11  . promethazine (PHENERGAN) 25 MG tablet TAKE 1 TABLET (25 MG TOTAL) BY MOUTH EVERY 8 (EIGHT) HOURS AS NEEDED FOR NAUSEA OR VOMITING. 20 tablet 0   No current facility-administered medications on file prior to visit.   Allergies  Allergen Reactions  . Sulfa Antibiotics Hives   Social History   Social History  . Marital Status: Widowed    Spouse Name: N/A  . Number of Children: N/A  . Years of Education: N/A   Occupational History  . Not on file.   Social History Main Topics  . Smoking status: Former Research scientist (life sciences)  . Smokeless tobacco: Not on file  . Alcohol Use: No  . Drug Use: No  . Sexual Activity: Not on file   Other Topics Concern  . Not on file   Social History Narrative      Review of Systems  All other systems reviewed and are negative.      Objective:   Physical Exam  Cardiovascular: Normal rate, regular rhythm and normal heart sounds.   Pulmonary/Chest: Effort normal and breath sounds normal.  Abdominal: Soft. Bowel sounds are normal. She exhibits no distension. There is no tenderness. There is no rebound.  Musculoskeletal:  Right hip: She exhibits decreased range of motion, decreased strength and tenderness. She exhibits no bony tenderness, no crepitus and no deformity.       Lumbar back: She exhibits decreased range of motion, tenderness and pain. She exhibits no spasm.  Vitals reviewed.         Assessment & Plan:  Hip pain, right - Plan: DG Lumbar Spine Complete, DG HIP UNILAT WITH PELVIS 2-3 VIEWS RIGHT, CBC with Differential/Platelet  I believe she likely strained a muscle in her lower back and possibly a hip flexor muscle with the movement she was performing that day getting in and out of a chair repeatedly. This definitely seems to be musculoskeletal in nature. I will obtain an x-ray of the hip to  rule out arthritis in the hip or significant underlying bony pathology. I will also obtain an x-ray of the back to rule out degenerative disc disease in the lumbar spine along with a CBC to evaluate for any evidence of an elevated white blood cell count or sign of infection. If x-rays and lab work is normal, I would recommend tincture of time over the next 1-2 weeks for symptoms to gradually improve. If they don't at that point I would recommend physical therapy. If the symptoms change at all or begin to sound more visceral in nature that we may need to pursue imaging of the abdomen and pelvis however at this time I believe it is a muscle strain, perhaps iliopsoas muscle

## 2015-05-25 DIAGNOSIS — Z803 Family history of malignant neoplasm of breast: Secondary | ICD-10-CM | POA: Diagnosis not present

## 2015-05-25 DIAGNOSIS — Z1231 Encounter for screening mammogram for malignant neoplasm of breast: Secondary | ICD-10-CM | POA: Diagnosis not present

## 2015-05-25 DIAGNOSIS — M810 Age-related osteoporosis without current pathological fracture: Secondary | ICD-10-CM | POA: Diagnosis not present

## 2015-05-25 DIAGNOSIS — M85851 Other specified disorders of bone density and structure, right thigh: Secondary | ICD-10-CM | POA: Diagnosis not present

## 2015-05-25 LAB — HM DEXA SCAN

## 2015-05-31 ENCOUNTER — Telehealth: Payer: Self-pay | Admitting: Family Medicine

## 2015-05-31 NOTE — Telephone Encounter (Signed)
Patient calling to get the results of her bone density test  [phone]

## 2015-06-01 NOTE — Telephone Encounter (Signed)
Called pt and informed her that we have not received results yet that I have seen. She is going to check back at the 1st of next week to see if we have the reports.

## 2015-06-02 ENCOUNTER — Encounter: Payer: Self-pay | Admitting: Family Medicine

## 2015-06-14 ENCOUNTER — Encounter: Payer: Self-pay | Admitting: Family Medicine

## 2015-06-14 ENCOUNTER — Ambulatory Visit (INDEPENDENT_AMBULATORY_CARE_PROVIDER_SITE_OTHER): Payer: PPO | Admitting: Family Medicine

## 2015-06-14 VITALS — BP 122/72 | HR 80 | Temp 98.0°F | Resp 14 | Ht 60.0 in | Wt 210.0 lb

## 2015-06-14 DIAGNOSIS — J209 Acute bronchitis, unspecified: Secondary | ICD-10-CM | POA: Diagnosis not present

## 2015-06-14 MED ORDER — GUAIFENESIN-CODEINE 100-10 MG/5ML PO SOLN: 5.0000 mL | Freq: Four times a day (QID) | ORAL | Status: DC | PRN

## 2015-06-14 MED ORDER — AZITHROMYCIN 250 MG PO TABS: ORAL_TABLET | ORAL | Status: DC

## 2015-06-14 MED ORDER — ALBUTEROL SULFATE HFA 108 (90 BASE) MCG/ACT IN AERS: 2.0000 | INHALATION_SPRAY | Freq: Four times a day (QID) | RESPIRATORY_TRACT | Status: DC | PRN

## 2015-06-14 MED ORDER — PREDNISONE 20 MG PO TABS: 40.0000 mg | ORAL_TABLET | Freq: Every day | ORAL | Status: DC

## 2015-06-14 NOTE — Patient Instructions (Signed)
Take antibiotics as prescribed Take prednisone Use inhaler Take Robitussin DM for cough We will call with bone density results

## 2015-06-15 NOTE — Progress Notes (Signed)
Patient ID: Christina Lyons, female   DOB: 28-May-1940, 75 y.o.   MRN: [id number]    Subjective:    Patient ID: Christina Lyons, female    DOB: 05-22-1940, 75 y.o.   MRN: [id number]  Patient presents for Illness Patient with cough with mild production, tightness in her chest occasional shortness of breath that has worsened over the past 2 weeks. She states it feels like her previous bronchitis. She has been using her albuterol inhaler which has helped. She does not have any underlying asthma or COPD and she is a nonsmoker. She's not had any recent fever. She's had some mild drainage from the nose. She tried making a home cough syrup but this has not helped. She denies any GI symptoms. Denies any sharp chest pain with any radiating features.    Review Of Systems:  GEN- denies fatigue, fever, weight loss,weakness, recent illness HEENT- denies eye drainage, change in vision, +nasal discharge, CVS- denies chest pain, palpitations RESP- denies SOB, +cough, +wheeze ABD- denies N/V, change in stools, abd pain GU- denies dysuria, hematuria, dribbling, incontinence MSK- denies joint pain, muscle aches, injury Neuro- denies headache, dizziness, syncope, seizure activity       Objective:    BP 122/72 mmHg  Pulse 80  Temp(Src) 98 F (36.7 C) (Oral)  Resp 14  Ht 5' (1.524 m)  Wt 210 lb (95.255 kg)  BMI 41.01 kg/m2  SpO2 97% GEN- NAD, alert and oriented x3 HEENT- PERRL, EOMI, non injected sclera, pink conjunctiva, MMM, oropharynx clear, nares clear rhinorrhea, TM clear no effusion  Neck- Supple, shotty LAD  CVS- RRR, no murmur RESP- rhonchi, wheeze bilat, no retractions, normal WOB at rest  EXT- No edema Pulses- Radial,2+        Assessment & Plan:      Problem List Items Addressed This Visit    None    Visit Diagnoses    Acute bronchitis, unspecified organism    -  Primary    Treat bronchitis, high risk for PNA so will cover with zpak as well, prednisone, abuterol, cough syrup,  CXR if not improved, sats normal       Note: This dictation was prepared with Dragon dictation along with smaller phrase technology. Any transcriptional errors that result from this process are unintentional.

## 2015-06-16 NOTE — Telephone Encounter (Signed)
Patient aware of results.

## 2015-06-18 ENCOUNTER — Other Ambulatory Visit: Payer: Self-pay | Admitting: Family Medicine

## 2015-06-21 ENCOUNTER — Encounter: Payer: Self-pay | Admitting: Family Medicine

## 2015-06-30 DIAGNOSIS — H2513 Age-related nuclear cataract, bilateral: Secondary | ICD-10-CM | POA: Diagnosis not present

## 2015-06-30 DIAGNOSIS — H5200 Hypermetropia, unspecified eye: Secondary | ICD-10-CM | POA: Diagnosis not present

## 2015-06-30 DIAGNOSIS — H25013 Cortical age-related cataract, bilateral: Secondary | ICD-10-CM | POA: Diagnosis not present

## 2015-06-30 DIAGNOSIS — H524 Presbyopia: Secondary | ICD-10-CM | POA: Diagnosis not present

## 2015-07-06 ENCOUNTER — Other Ambulatory Visit: Payer: Self-pay | Admitting: Family Medicine

## 2015-07-12 ENCOUNTER — Other Ambulatory Visit: Payer: Self-pay | Admitting: Family Medicine

## 2015-07-26 ENCOUNTER — Ambulatory Visit (INDEPENDENT_AMBULATORY_CARE_PROVIDER_SITE_OTHER): Payer: PPO | Admitting: Family Medicine

## 2015-07-26 ENCOUNTER — Ambulatory Visit
Admission: RE | Admit: 2015-07-26 | Discharge: 2015-07-26 | Disposition: A | Discharge status: Home / Self Care | Payer: PPO | Source: Ambulatory Visit | Attending: Family Medicine | Admitting: Family Medicine

## 2015-07-26 ENCOUNTER — Encounter: Payer: Self-pay | Admitting: Family Medicine

## 2015-07-26 VITALS — BP 130/74 | HR 82 | Temp 98.7°F | Resp 16 | Ht 60.0 in | Wt 205.0 lb

## 2015-07-26 DIAGNOSIS — J209 Acute bronchitis, unspecified: Secondary | ICD-10-CM

## 2015-07-26 DIAGNOSIS — R05 Cough: Secondary | ICD-10-CM | POA: Diagnosis not present

## 2015-07-26 DIAGNOSIS — J309 Allergic rhinitis, unspecified: Secondary | ICD-10-CM | POA: Diagnosis not present

## 2015-07-26 MED ORDER — HYDROCODONE-ACETAMINOPHEN 5-325 MG PO TABS: 1.0000 | ORAL_TABLET | Freq: Four times a day (QID) | ORAL | Status: DC | PRN

## 2015-07-26 MED ORDER — PREDNISONE 20 MG PO TABS: 40.0000 mg | ORAL_TABLET | Freq: Every day | ORAL | Status: DC

## 2015-07-26 NOTE — Patient Instructions (Signed)
Continue zyrtec and flonase for allergies Take the prednisone  Robitussin DM  Get CXR - get this done  We will get you set up for Pulmonary Function Test  F/U pending results

## 2015-07-27 ENCOUNTER — Encounter: Payer: Self-pay | Admitting: Family Medicine

## 2015-07-27 NOTE — Progress Notes (Signed)
Patient ID: Christina Lyons, female   DOB: Feb 19, 1941, 75 y.o.   MRN: [id number]      Subjective:    Patient ID: Christina Lyons, female    DOB: 1940-05-22, 75 y.o.   MRN: [id number]  Patient presents for Illness  Pt here with cough, occasional wheezing, ratteling in chest for past few days, started with her allery symptoms, she took claritin and flonase initially. No fever. Has been treated for bronchitis multiple times over past couple of years. Remote smoking history.  She has been using albuterol past 2 days, which helps her breathing    Review Of Systems:  GEN- denies fatigue, fever, weight loss,weakness, recent illness HEENT- denies eye drainage, change in vision, nasal discharge, CVS- denies chest pain, palpitations RESP- denies SOB, +cough,+ wheeze ABD- denies N/V, change in stools, abd pain Neuro- denies headache, dizziness, syncope, seizure activity       Objective:    BP 130/74 mmHg  Pulse 82  Temp(Src) 98.7 F (37.1 C) (Oral)  Resp 16  Ht 5' (1.524 m)  Wt 205 lb (92.987 kg)  BMI 40.04 kg/m2  SpO2 98% GEN- NAD, alert and oriented x3 HEENT- PERRL, EOMI, non injected sclera, pink conjunctiva, MMM, oropharynx clear, nares clear rhinorrhea, no sinus tenderness  Neck- Supple, no LAD CVS- RRR, no murmur RESP-CTAB, harsh cough, normal WOB, no wheeze EXT- No edema Pulses- Radial- 2+        Assessment & Plan:      Problem List Items Addressed This Visit    None    Visit Diagnoses    Allergic rhinitis, unspecified allergic rhinitis type    -  Primary    Continue flonase and anti-histamine, this is likley contributing to her symtoms, but multiple treatments for bronchitis,remote history of smoking.     Acute bronchitis, unspecified organism        Given steroid burst, robitussin DM continue albuterol. Plan CXR and PFT     Relevant Orders    DG Chest 2 View (Completed)       Note: This dictation was prepared with Dragon dictation along with smaller phrase  technology. Any transcriptional errors that result from this process are unintentional.

## 2015-08-03 DIAGNOSIS — H2512 Age-related nuclear cataract, left eye: Secondary | ICD-10-CM | POA: Diagnosis not present

## 2015-08-03 DIAGNOSIS — H2511 Age-related nuclear cataract, right eye: Secondary | ICD-10-CM | POA: Diagnosis not present

## 2015-08-03 DIAGNOSIS — H43812 Vitreous degeneration, left eye: Secondary | ICD-10-CM | POA: Diagnosis not present

## 2015-08-03 DIAGNOSIS — H02833 Dermatochalasis of right eye, unspecified eyelid: Secondary | ICD-10-CM | POA: Diagnosis not present

## 2015-08-03 DIAGNOSIS — H43393 Other vitreous opacities, bilateral: Secondary | ICD-10-CM | POA: Diagnosis not present

## 2015-08-03 DIAGNOSIS — H35363 Drusen (degenerative) of macula, bilateral: Secondary | ICD-10-CM | POA: Diagnosis not present

## 2015-08-03 DIAGNOSIS — H25011 Cortical age-related cataract, right eye: Secondary | ICD-10-CM | POA: Diagnosis not present

## 2015-08-22 DIAGNOSIS — H2511 Age-related nuclear cataract, right eye: Secondary | ICD-10-CM | POA: Diagnosis not present

## 2015-09-11 ENCOUNTER — Ambulatory Visit (INDEPENDENT_AMBULATORY_CARE_PROVIDER_SITE_OTHER): Payer: PPO | Admitting: Internal Medicine

## 2015-09-11 DIAGNOSIS — J209 Acute bronchitis, unspecified: Secondary | ICD-10-CM | POA: Diagnosis not present

## 2015-09-11 LAB — PULMONARY FUNCTION TEST
DL/VA % PRED: 89 %
DL/VA: 4.09 ml/min/mmHg/L
DLCO UNC % PRED: 84 %
DLCO UNC: 18.56 ml/min/mmHg
DLCO cor % pred: 77 %
DLCO cor: 16.98 ml/min/mmHg
FEF 25-75 PRE: 1.51 L/s
FEF 25-75 Post: 2.22 L/sec
FEF2575-%CHANGE-POST: 46 %
FEF2575-%Pred-Post: 132 %
FEF2575-%Pred-Pre: 90 %
FEV1-%CHANGE-POST: 8 %
FEV1-%PRED-PRE: 92 %
FEV1-%Pred-Post: 99 %
FEV1-Post: 2.02 L
FEV1-Pre: 1.86 L
FEV1FVC-%Change-Post: 6 %
FEV1FVC-%Pred-Pre: 102 %
FEV6-%Change-Post: 1 %
FEV6-%PRED-POST: 96 %
FEV6-%Pred-Pre: 95 %
FEV6-POST: 2.48 L
FEV6-PRE: 2.43 L
FEV6FVC-%PRED-POST: 105 %
FEV6FVC-%PRED-PRE: 105 %
FVC-%CHANGE-POST: 1 %
FVC-%PRED-PRE: 90 %
FVC-%Pred-Post: 92 %
FVC-POST: 2.48 L
FVC-Pre: 2.43 L
PRE FEV6/FVC RATIO: 100 %
Post FEV1/FVC ratio: 81 %
Post FEV6/FVC ratio: 100 %
Pre FEV1/FVC ratio: 77 %
RV % PRED: 101 %
RV: 2.18 L
TLC % pred: 99 %
TLC: 4.77 L

## 2015-09-11 NOTE — Progress Notes (Signed)
PFT done today. 

## 2015-09-14 DIAGNOSIS — H25012 Cortical age-related cataract, left eye: Secondary | ICD-10-CM | POA: Diagnosis not present

## 2015-09-14 DIAGNOSIS — H2512 Age-related nuclear cataract, left eye: Secondary | ICD-10-CM | POA: Diagnosis not present

## 2015-09-18 ENCOUNTER — Ambulatory Visit (INDEPENDENT_AMBULATORY_CARE_PROVIDER_SITE_OTHER): Payer: PPO | Admitting: Family Medicine

## 2015-09-18 ENCOUNTER — Encounter: Payer: Self-pay | Admitting: Family Medicine

## 2015-09-18 ENCOUNTER — Other Ambulatory Visit: Payer: Self-pay | Admitting: *Deleted

## 2015-09-18 VITALS — BP 122/78 | HR 84 | Temp 98.6°F | Resp 16 | Ht 60.0 in | Wt 207.0 lb

## 2015-09-18 DIAGNOSIS — R053 Chronic cough: Secondary | ICD-10-CM

## 2015-09-18 DIAGNOSIS — R05 Cough: Secondary | ICD-10-CM

## 2015-09-18 DIAGNOSIS — J42 Unspecified chronic bronchitis: Secondary | ICD-10-CM

## 2015-09-18 MED ORDER — LOSARTAN POTASSIUM 50 MG PO TABS: ORAL_TABLET | ORAL | Status: DC

## 2015-09-18 NOTE — Progress Notes (Signed)
Subjective:    Patient ID: Christina Lyons, female    DOB: 06/29/1940, 75 y.o.   MRN: [id number]  HPI  A she has been suffering from recurrent episodes of bronchitis off and on since March. She denies any fever. She denies any hemoptysis. She denies any purulent sputum. She denies any shortness of breath. She denies any night sweats or weight loss. She has been treated as recurrent episodes of bronchitis. Chest x-ray is normal. Recently had pulmonary function test performed at pulmonology which revealed an FEV1 to FVC ratio of 77% prior to bronchodilator, and 81% postbronchodilator. FEV1 was greater than 90% both pre-and postbronchodilator. The diffusion capacity was slightly reduced but did not indicate severe alveolar damage. Patient describes the cough is starting with a burning sensation in her throat which then triggers this itchy nonproductive cough that will not stop. She does tend to respond to prednisone. Of note she is on lisinopril Past Medical History  Diagnosis Date  . Osteomyelitis ([place]) 1946    both legs  . Hyperlipidemia   . Hypertension   . Other idiopathic scoliosis, thoracolumbar region   . Osteopenia   . GERD (gastroesophageal reflux disease)   . DDD (degenerative disc disease), lumbar    Past Surgical History  Procedure Laterality Date  . Abdominal hysterectomy    . Cholecystectomy     Current Outpatient Prescriptions on File Prior to Visit  Medication Sig Dispense Refill  . albuterol (PROVENTIL HFA;VENTOLIN HFA) 108 (90 Base) MCG/ACT inhaler Inhale 2 puffs into the lungs every 6 (six) hours as needed for wheezing or shortness of breath. 1 Inhaler 2  . alendronate (FOSAMAX) 70 MG tablet Take 1 tablet (70 mg total) by mouth every 7 (seven) days. Take with a full glass of water on an empty stomach. 4 tablet 11  . HYDROcodone-acetaminophen (NORCO/VICODIN) 5-325 MG tablet Take 1 tablet by mouth every 6 (six) hours as needed. 60 tablet 0  . lisinopril (PRINIVIL,ZESTRIL)  10 MG tablet TAKE 1 TABLET BY MOUTH DAILY. 30 tablet 11  . LORazepam (ATIVAN) 1 MG tablet TAKE ONE TABLET BY MOUTH TWICE DAILY AS NEEDED. 60 tablet 2  . omeprazole (PRILOSEC) 20 MG capsule TAKE ONE CAPSULE BY MOUTH TWICE DAILY 60 capsule 11  . predniSONE (DELTASONE) 20 MG tablet Take 2 tablets (40 mg total) by mouth daily with breakfast. For 5 days 10 tablet 0  . promethazine (PHENERGAN) 25 MG tablet TAKE 1 TABLET (25 MG TOTAL) BY MOUTH EVERY 8 (EIGHT) HOURS AS NEEDED FOR NAUSEA OR VOMITING. 20 tablet 0   No current facility-administered medications on file prior to visit.   Allergies  Allergen Reactions  . Sulfa Antibiotics Hives   Social History   Social History  . Marital Status: Widowed    Spouse Name: N/A  . Number of Children: N/A  . Years of Education: N/A   Occupational History  . Not on file.   Social History Main Topics  . Smoking status: Former Research scientist (life sciences)  . Smokeless tobacco: Not on file  . Alcohol Use: No  . Drug Use: No  . Sexual Activity: Not on file   Other Topics Concern  . Not on file   Social History Narrative     Review of Systems  All other systems reviewed and are negative.      Objective:   Physical Exam  Neck: Neck supple.  Cardiovascular: Normal rate, regular rhythm and normal heart sounds.   No murmur heard. Pulmonary/Chest: Effort normal and breath  sounds normal. No respiratory distress. She has no wheezes. She has no rales. She exhibits no tenderness.  Lymphadenopathy:    She has no cervical adenopathy.  Vitals reviewed.         Assessment & Plan:  Chronic cough  I suspect that her coughing is due to bradykinin caused by the ACE inhibitor. I recommended discontinuation of lisinopril and beginning losartan 50 mg by mouth daily and recheck blood pressure and cough in 2 weeks. If cough persists consider laryngo-esophageal reflux versus upper airway cough syndrome versus silent postnasal drip versus sarcoidosis. However I believe that we  need to rule out lisinopril first

## 2015-10-06 ENCOUNTER — Encounter: Payer: Self-pay | Admitting: *Deleted

## 2015-10-06 ENCOUNTER — Ambulatory Visit (INDEPENDENT_AMBULATORY_CARE_PROVIDER_SITE_OTHER): Payer: PPO | Admitting: Internal Medicine

## 2015-10-06 ENCOUNTER — Encounter: Payer: Self-pay | Admitting: Internal Medicine

## 2015-10-06 VITALS — BP 124/70 | HR 71 | Ht 62.0 in | Wt 208.8 lb

## 2015-10-06 DIAGNOSIS — R059 Cough, unspecified: Secondary | ICD-10-CM | POA: Insufficient documentation

## 2015-10-06 DIAGNOSIS — R05 Cough: Secondary | ICD-10-CM

## 2015-10-06 LAB — NITRIC OXIDE: Nitric Oxide: 9

## 2015-10-06 MED ORDER — PREDNISONE 10 MG PO TABS: ORAL_TABLET | ORAL | Status: DC

## 2015-10-06 NOTE — Progress Notes (Signed)
Subjective:    Patient ID: Christina Lyons, female    DOB: 1940-11-18,     MRN: [id number]  HPI  46 yowf quit smoking 1982 s/p NF in 2000 with new onset rhinitis/ sneezing/chest congestion winter 2016 rx prn zpak  then continued attacks of scratchy throat / coughing  Spells better with prednisone but kept recurring so  d/c ACEi mid September 18 2015 and referred to pulmonary clinic 10/06/2015 by Dr Buelah Manis as still not better    10/06/2015 1st [facility] office visit/ Felita Bump   Chief Complaint  Patient presents with  . Pulmonary Consult    Referred by Dr. Jenna Luo. Pt c/o cough and SOB off and on since Jan 2017. Cough is prod with yellow to clear sputum.   since stopped lisinopril no more spells but still sneezing and slt discolored am mucus and sleeps well but coughs ups sev tbsp mucus each am and sporadic cough s pattern through the daytimeand mild doe esp when coughing   No obvious other patterns in day to day or daytime variabilty or assoc  or cp or chest tightness, subjective wheeze overt sinus or hb symptoms. No unusual exp hx or h/o childhood pna/ asthma or knowledge of premature birth.  Sleeping ok without nocturnal  or early am exacerbation  of respiratory  c/o's or need for noct saba. Also denies any obvious fluctuation of symptoms with weather or environmental changes or other aggravating or alleviating factors except as outlined above   Current Medications, Allergies, Complete Past Medical History, Past Surgical History, Family History, and Social History were reviewed in [employer] record.            Review of Systems  Constitutional: Negative for fever, chills and unexpected weight change.  HENT: Positive for sneezing. Negative for congestion, dental problem, ear pain, nosebleeds, postnasal drip, rhinorrhea, sinus pressure, sore throat, trouble swallowing and voice change.   Eyes: Negative for visual disturbance.  Respiratory: Positive for cough  and shortness of breath. Negative for choking.   Cardiovascular: Negative for chest pain and leg swelling.  Gastrointestinal: Negative for vomiting, abdominal pain and diarrhea.  Genitourinary: Negative for difficulty urinating.  Musculoskeletal: Negative for arthralgias.  Skin: Negative for rash.  Neurological: Negative for tremors, syncope and headaches.  Hematological: Does not bruise/bleed easily.       Objective:   Physical Exam  amb wf nad  Wt Readings from Last 3 Encounters:  10/06/15 208 lb 12.8 oz (94.711 kg)  09/18/15 207 lb (93.895 kg)  07/26/15 205 lb (92.987 kg)    Vital signs reviewed   HEENT: nl dentition, turbinates -oropharynx with mild watery pnd/cobblestoing. Nl external ear canals without cough reflex   NECK :  without JVD/Nodes/TM/ nl carotid upstrokes bilaterally   LUNGS: no acc muscle use,  Nl contour chest which is clear to A and P bilaterally without cough on insp or exp maneuvers   CV:  RRR  no s3 or murmur or increase in P2, no edema   ABD:  soft and nontender with nl inspiratory excursion in the supine position. No bruits or organomegaly, bowel sounds nl  MS:  Nl gait/ ext warm without deformities, calf tenderness, cyanosis or clubbing No obvious joint restrictions   SKIN: warm and dry without lesions    NEURO:  alert, approp, nl sensorium with  no motor deficits       I personally reviewed images and agree with radiology impression as follows:  CXR:  07/27/15  No active disease.       Assessment & Plan:

## 2015-10-06 NOTE — Patient Instructions (Signed)
Stop fish oil and fosfamax for the next 6 weeks and the cough should gradually resolve  Prednisone 10 mg take  4 each am x 2 days,   2 each am x 2 days,  1 each am x 2 days and stop   Increase prilosec to 20 mg x 2 Take 30-60 min before first meal of the day and add pepcid 20 mg at bedtime until return to office   For cough >>>  mucinex dm up to 1200 mg every 12 hours as needed or delsym cough syrum  For drainage / throat tickle >> take CHLORPHENIRAMINE  4 mg - take one every 4 hours as needed - available over the counter- may cause drowsiness so start with just a bedtime dose or two and see how you tolerate it before trying in daytime    GERD (REFLUX)  is an extremely common cause of respiratory symptoms just like yours , many times with no obvious heartburn at all.    It can be treated with medication, but also with lifestyle changes including elevation of the head of your bed (ideally with 6 inch  bed blocks),  Smoking cessation, avoidance of late meals, excessive alcohol, and avoid fatty foods, chocolate, peppermint, colas, red wine, and acidic juices such as orange juice.  NO MINT OR MENTHOL PRODUCTS SO NO COUGH DROPS  USE SUGARLESS CANDY INSTEAD (Jolley ranchers or Stover's or Life Savers) or even ice chips will also do - the key is to swallow to prevent all throat clearing. NO OIL BASED VITAMINS - use powdered substitutes.   Please schedule a follow up office visit in 6 weeks, call sooner if needed

## 2015-10-07 ENCOUNTER — Other Ambulatory Visit: Payer: Self-pay | Admitting: Family Medicine

## 2015-10-07 ENCOUNTER — Encounter: Payer: Self-pay | Admitting: Internal Medicine

## 2015-10-07 NOTE — Assessment & Plan Note (Addendum)
PFT's 09/11/2015  FEV1 2.02 (99 % ) ratio 81  ERV 19%,   DLCO  84/77 % corrects to 89 % for alv volume  - FENO 10/06/2015  =   9    The most common causes of chronic cough in immunocompetent adults include the following: upper airway cough syndrome (UACS), previously referred to as postnasal drip syndrome (PNDS), which is caused by variety of rhinosinus conditions; (2) asthma; (3) GERD; (4) chronic bronchitis from cigarette smoking or other inhaled environmental irritants; (5) nonasthmatic eosinophilic bronchitis; and (6) bronchiectasis.   These conditions, singly or in combination, have accounted for up to 94% of the causes of chronic cough in prospective studies.   Other conditions have constituted no >6% of the causes in prospective studies These have included bronchogenic carcinoma, chronic interstitial pneumonia, sarcoidosis, left ventricular failure, ACEI-induced cough, and aspiration from a condition associated with pharyngeal dysfunction.    Chronic cough is often simultaneously caused by more than one condition. A single cause has been found from 38 to 82% of the time, multiple causes from 18 to 62%. Multiply caused cough has been the result of three diseases up to 42% of the time.   FENO << 25 strongly argues against eos inflammation eg asthma as cause and based on hx and exam  strongly suspect this is all  Upper airway cough syndrome, so named because it's frequently impossible to sort out how much is  CR/sinusitis with freq throat clearing (which can be related to primary GERD)   vs  causing  secondary (" extra esophageal")  GERD from wide swings in gastric pressure that occur with throat clearing, often  promoting self use of mint and menthol lozenges that reduce the lower esophageal sphincter tone and exacerbate the problem further in a cyclical fashion.   These are the same pts (now being labeled as having "irritable larynx syndrome" by some cough centers) who not infrequently have a history  of having failed to tolerate ace inhibitors as is the likely case here ,  dry powder inhalers or biphosphonates or report having atypical reflux symptoms that don't respond to standard doses of PPI , and are easily confused as having aecopd or asthma flares by even experienced allergists/ pulmonologists.   So rec redouble efforts to eliminate all acid and non acid reflux (her NF is 76 y old and I strongly doubt still effective) and rx just with sort term prednisone along with 1st gen H1 per guidelines then return here in 6 weeks if not improved  Total time devoted to counseling  = 35/24m review case with pt/ discussion of options/alternatives/ personally creating written instructions  in presence of pt  then going over those specific  Instructions directly with the pt including how to use all of the meds but in particular covering each new medication in detail and the difference between the maintenance/automatic meds and the prns using an action plan format for the latter.

## 2015-10-09 NOTE — Telephone Encounter (Signed)
Medication called to pharmacy. 

## 2015-10-09 NOTE — Telephone Encounter (Signed)
ok 

## 2015-10-09 NOTE — Telephone Encounter (Signed)
Ok to refill 

## 2015-10-31 DIAGNOSIS — H25012 Cortical age-related cataract, left eye: Secondary | ICD-10-CM | POA: Diagnosis not present

## 2015-10-31 DIAGNOSIS — H2512 Age-related nuclear cataract, left eye: Secondary | ICD-10-CM | POA: Diagnosis not present

## 2015-10-31 DIAGNOSIS — H25812 Combined forms of age-related cataract, left eye: Secondary | ICD-10-CM | POA: Diagnosis not present

## 2015-11-17 ENCOUNTER — Ambulatory Visit: Payer: PPO | Admitting: Internal Medicine

## 2015-11-25 ENCOUNTER — Other Ambulatory Visit: Payer: Self-pay | Admitting: Family Medicine

## 2015-11-27 ENCOUNTER — Other Ambulatory Visit: Payer: Self-pay | Admitting: Family Medicine

## 2015-11-27 NOTE — Telephone Encounter (Signed)
Ok to refill??  Last office visit 09/18/2015.  Last refill 10/09/2015.

## 2015-11-27 NOTE — Telephone Encounter (Signed)
Ok x 2  

## 2015-11-27 NOTE — Telephone Encounter (Signed)
Medication called to pharmacy. 

## 2015-12-11 DIAGNOSIS — Z961 Presence of intraocular lens: Secondary | ICD-10-CM | POA: Diagnosis not present

## 2015-12-25 ENCOUNTER — Other Ambulatory Visit: Payer: Self-pay | Admitting: Family Medicine

## 2015-12-25 NOTE — Telephone Encounter (Signed)
Ok to refill 

## 2015-12-25 NOTE — Telephone Encounter (Signed)
ok 

## 2016-02-08 ENCOUNTER — Other Ambulatory Visit: Payer: Self-pay | Admitting: Family Medicine

## 2016-02-08 NOTE — Telephone Encounter (Signed)
Ok to refill 

## 2016-02-09 NOTE — Telephone Encounter (Signed)
ok 

## 2016-02-12 ENCOUNTER — Telehealth: Payer: Self-pay | Admitting: Family Medicine

## 2016-02-12 NOTE — Telephone Encounter (Signed)
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  Thank you Larene Beach

## 2016-02-12 NOTE — Telephone Encounter (Signed)
Immunization updated for pt

## 2016-03-14 ENCOUNTER — Other Ambulatory Visit: Payer: Self-pay | Admitting: Family Medicine

## 2016-03-14 NOTE — Telephone Encounter (Signed)
Medication called to pharmacy. 

## 2016-03-14 NOTE — Telephone Encounter (Signed)
ok 

## 2016-03-14 NOTE — Telephone Encounter (Signed)
Ok to refill 

## 2016-04-12 ENCOUNTER — Ambulatory Visit: Payer: PPO | Admitting: Family Medicine

## 2016-04-12 ENCOUNTER — Encounter: Payer: Self-pay | Admitting: Family Medicine

## 2016-04-12 ENCOUNTER — Ambulatory Visit (INDEPENDENT_AMBULATORY_CARE_PROVIDER_SITE_OTHER): Payer: PPO | Admitting: Family Medicine

## 2016-04-12 VITALS — BP 130/74 | HR 82 | Temp 99.4°F | Resp 18 | Ht 62.0 in | Wt 207.0 lb

## 2016-04-12 DIAGNOSIS — J069 Acute upper respiratory infection, unspecified: Secondary | ICD-10-CM | POA: Diagnosis not present

## 2016-04-12 DIAGNOSIS — J0100 Acute maxillary sinusitis, unspecified: Secondary | ICD-10-CM

## 2016-04-12 MED ORDER — METHYLPREDNISOLONE ACETATE 40 MG/ML IJ SUSP
40.0000 mg | Freq: Once | INTRAMUSCULAR | Status: AC
  Administered 2016-04-12: 40 mg via INTRAMUSCULAR

## 2016-04-12 MED ORDER — AMOXICILLIN 875 MG PO TABS: 875.0000 mg | ORAL_TABLET | Freq: Two times a day (BID) | ORAL | 0 refills | Status: DC

## 2016-04-12 MED ORDER — ALBUTEROL SULFATE HFA 108 (90 BASE) MCG/ACT IN AERS: 2.0000 | INHALATION_SPRAY | Freq: Four times a day (QID) | RESPIRATORY_TRACT | 2 refills | Status: DC | PRN

## 2016-04-12 NOTE — Progress Notes (Signed)
   Subjective:    Patient ID: Christina Lyons, female    DOB: [birth year], 76 y.o.   MRN: [id number]  Patient presents for Illness (x4 days- sinus pressure, productive cough with yellow mucus)   Patient here with illness. She said sinus pressure drainage now has pain into her her teeth in her ear. She has had some mild cough. + chills, low grade fever. She has been seen by pulmonology she does not have any underlying lung pathology was noted to have more upper airway cough syndrome. Has been using albuterol which she keeps on hand this has helped.Marland Kitchen She's been taking Coricidin for cough she has hydrocodone for pain.     Review Of Systems:  GEN- denies fatigue, +fever, weight loss,weakness, recent illness HEENT- denies eye drainage, change in vision, +nasal discharge, CVS- denies chest pain, palpitations RESP- denies SOB, +cough, wheeze ABD- denies N/V, change in stools, abd pain Neuro- denies headache, dizziness, syncope, seizure activity       Objective:    BP 130/74 (BP Location: Left Arm, Patient Position: Sitting, Cuff Size: Large)   Pulse 82   Temp 99.4 F (37.4 C) (Oral)   Resp 18   Ht 5\' 2"  (1.575 m)   Wt 207 lb (93.9 kg)   SpO2 96%   BMI 37.86 kg/m  GEN- NAD, alert and oriented x3 HEENT- PERRL, EOMI, non injected sclera, pink conjunctiva, MMM, oropharynx mild injection, TM clear bilat no effusion,  + maxillary/frontal  sinus tenderness, inflammed turbinates,  Nasal drainage  Neck- Supple, +shotty submandibular LAD CVS- RRR, no murmur RESP-CTAB EXT- No edema Pulses- Radial 2+         Assessment & Plan:      Problem List Items Addressed This Visit    Sinusitis, acute - Primary    History of sinusitis. Will treat based on history sinusitis  with concurrent URI. Will give depo Medrol injection, amox 87mg  x 10 days, use coricidan, nasal saline rinse Has as albuterol for any wheezing/bronchitic symptoms that may start       Relevant Medications   amoxicillin  (AMOXIL) 875 MG tablet   methylPREDNISolone acetate (DEPO-MEDROL) injection 40 mg (Completed)    Other Visit Diagnoses    Acute URI          Note: This dictation was prepared with Dragon dictation along with smaller phrase technology. Any transcriptional errors that result from this process are unintentional.

## 2016-04-12 NOTE — Assessment & Plan Note (Signed)
History of sinusitis. Will treat based on history sinusitis  with concurrent URI. Will give depo Medrol injection, amox 87mg  x 10 days, use coricidan, nasal saline rinse Has as albuterol for any wheezing/bronchitic symptoms that may start

## 2016-04-12 NOTE — Patient Instructions (Signed)
Take antibiotics as prescribed Use inhaler Steroid shot given Call if not improving F/U as needed

## 2016-04-15 ENCOUNTER — Telehealth: Payer: Self-pay | Admitting: *Deleted

## 2016-04-15 MED ORDER — PREDNISONE 20 MG PO TABS: 20.0000 mg | ORAL_TABLET | Freq: Every day | ORAL | 0 refills | Status: DC

## 2016-04-15 NOTE — Telephone Encounter (Signed)
Received call from patient.   Reports that she is not improved from visit on Friday. States that HA, ear pain, an tooth pain returned after injection.   Reports that she is taking prescription as prescribed.   MD please advise.

## 2016-04-15 NOTE — Telephone Encounter (Signed)
Call placed to patient and patient made aware per VM.  

## 2016-04-15 NOTE — Telephone Encounter (Signed)
Add prednisone 20mg  po daily for 5 days, in addition to the amoxicillin she should already be on

## 2016-04-29 ENCOUNTER — Other Ambulatory Visit: Payer: Self-pay | Admitting: Family Medicine

## 2016-04-29 NOTE — Telephone Encounter (Signed)
Medication called to pharmacy. 

## 2016-04-29 NOTE — Telephone Encounter (Signed)
Ok to refill 

## 2016-04-29 NOTE — Telephone Encounter (Signed)
ok 

## 2016-05-17 ENCOUNTER — Telehealth: Payer: Self-pay | Admitting: Family Medicine

## 2016-05-17 NOTE — Telephone Encounter (Signed)
Would not mix xanax and ativan.  She could take her lorazepam 30 min before flight.

## 2016-05-17 NOTE — Telephone Encounter (Signed)
Pt called [facility] that she is going to fly on the 28th and would like to know if she can take either more of her lorazepam or if we could call her in some Xanax's? She states that she had herd that one was better for this then the other but she is not the MD.

## 2016-05-17 NOTE — Telephone Encounter (Signed)
Patient aware of providers recommendations.  

## 2016-05-20 DIAGNOSIS — Z1231 Encounter for screening mammogram for malignant neoplasm of breast: Secondary | ICD-10-CM | POA: Diagnosis not present

## 2016-05-20 DIAGNOSIS — Z803 Family history of malignant neoplasm of breast: Secondary | ICD-10-CM | POA: Diagnosis not present

## 2016-05-20 LAB — HM MAMMOGRAPHY

## 2016-05-22 ENCOUNTER — Encounter: Payer: Self-pay | Admitting: Family Medicine

## 2016-05-23 ENCOUNTER — Telehealth: Payer: Self-pay | Admitting: Family Medicine

## 2016-05-23 NOTE — Telephone Encounter (Signed)
Ok to refill 

## 2016-05-23 NOTE — Telephone Encounter (Signed)
Patient calling to get rx for hydrocodone

## 2016-05-23 NOTE — Telephone Encounter (Signed)
ok 

## 2016-05-24 MED ORDER — HYDROCODONE-ACETAMINOPHEN 5-325 MG PO TABS: 1.0000 | ORAL_TABLET | Freq: Four times a day (QID) | ORAL | 0 refills | Status: DC | PRN

## 2016-05-24 NOTE — Telephone Encounter (Signed)
RX printed, left up front and patient aware to pick up  

## 2016-07-04 ENCOUNTER — Encounter: Payer: Self-pay | Admitting: Family Medicine

## 2016-07-04 ENCOUNTER — Ambulatory Visit
Admission: RE | Admit: 2016-07-04 | Discharge: 2016-07-04 | Disposition: A | Discharge status: Home / Self Care | Payer: PPO | Source: Ambulatory Visit | Attending: Family Medicine | Admitting: Family Medicine

## 2016-07-04 ENCOUNTER — Ambulatory Visit (INDEPENDENT_AMBULATORY_CARE_PROVIDER_SITE_OTHER): Payer: PPO | Admitting: Family Medicine

## 2016-07-04 VITALS — BP 146/90 | HR 72 | Temp 97.8°F | Resp 16 | Ht 60.0 in | Wt 207.0 lb

## 2016-07-04 DIAGNOSIS — R079 Chest pain, unspecified: Secondary | ICD-10-CM

## 2016-07-04 NOTE — Progress Notes (Signed)
Subjective:    Patient ID: Christina Lyons, female    DOB: Aug 02, 1940, 76 y.o.   MRN: [id number]  HPI Symptoms began a little more than 2 weeks ago. The patient was riding a scooter while in [place] on vacation at a theme park. She developed mild pain in her right lower chest radiating around to her right ribs. The pain is dull. She states it feels superficial. Pain cannot be reproduced with palpation. She denies any pleurisy. She denies any shortness of breath. She denies any hemoptysis. She denies any rash. She denies any nausea vomiting or diarrhea. She denies any melena. She denies any constipation. She denies any blood in her stool. She denies any dysuria or hematuria. There are no exacerbating or alleviating factors. Past Medical History:  Diagnosis Date  . DDD (degenerative disc disease), lumbar   . GERD (gastroesophageal reflux disease)   . Hyperlipidemia   . Hypertension   . Osteomyelitis ([place]) 1946   both legs  . Osteopenia   . Other idiopathic scoliosis, thoracolumbar region    Past Surgical History:  Procedure Laterality Date  . ABDOMINAL HYSTERECTOMY    . CHOLECYSTECTOMY     Current Outpatient Prescriptions on File Prior to Visit  Medication Sig Dispense Refill  . albuterol (PROVENTIL HFA;VENTOLIN HFA) 108 (90 Base) MCG/ACT inhaler Inhale 2 puffs into the lungs every 6 (six) hours as needed for wheezing or shortness of breath. 1 Inhaler 2  . amoxicillin (AMOXIL) 875 MG tablet Take 1 tablet (875 mg total) by mouth 2 (two) times daily. 20 tablet 0  . aspirin EC 81 MG tablet Take 81 mg by mouth daily.    . cholecalciferol (VITAMIN D) 1000 units tablet Take 1,000 Units by mouth 2 (two) times daily.    Marland Kitchen HYDROcodone-acetaminophen (NORCO/VICODIN) 5-325 MG tablet Take 1 tablet by mouth every 6 (six) hours as needed. 60 tablet 0  . LORazepam (ATIVAN) 1 MG tablet TAKE ONE TABLET BY MOUTH TWICE DAILY AS NEEDED FOR ANXIETY 60 tablet 0  . losartan (COZAAR) 50 MG tablet Take once a  day, stop lisinopril. 90 tablet 3  . Multiple Vitamin (MULTIVITAMIN) capsule Take 1 capsule by mouth daily.    . Omega-3 Fatty Acids (FISH OIL) 1000 MG CPDR Take 1 capsule by mouth 2 (two) times daily.    Marland Kitchen omeprazole (PRILOSEC) 20 MG capsule TAKE ONE CAPSULE BY MOUTH TWICE DAILY 60 capsule 6  . predniSONE (DELTASONE) 20 MG tablet Take 1 tablet (20 mg total) by mouth daily with breakfast. 5 tablet 0  . promethazine (PHENERGAN) 25 MG tablet TAKE 1 TABLET (25 MG TOTAL) BY MOUTH EVERY 8 (EIGHT) HOURS AS NEEDED FOR NAUSEA OR VOMITING. 20 tablet 0  . Turmeric 500 MG CAPS Take 1 Can by mouth 2 (two) times daily.     No current facility-administered medications on file prior to visit.    Allergies  Allergen Reactions  . Sulfa Antibiotics Hives   Social History   Social History  . Marital status: Widowed    Spouse name: N/A  . Number of children: N/A  . Years of education: N/A   Occupational History  . Retired    Social History Main Topics  . Smoking status: Former Smoker    Packs/day: 0.50    Years: 5.00    Types: Cigarettes    Quit date: 04/01/1980  . Smokeless tobacco: Never Used  . Alcohol use No  . Drug use: No  . Sexual activity: Not on file  Other Topics Concern  . Not on file   Social History Narrative  . No narrative on file      Review of Systems  All other systems reviewed and are negative.      Objective:   Physical Exam  Cardiovascular: Normal rate, regular rhythm, normal heart sounds and intact distal pulses.   No murmur heard. Pulmonary/Chest: Effort normal and breath sounds normal. No respiratory distress. She has no wheezes. She has no rales. She exhibits no tenderness.  Abdominal: Soft. Bowel sounds are normal. She exhibits no distension and no mass. There is no tenderness. There is no rebound and no guarding.  Musculoskeletal: She exhibits no tenderness or deformity.  Skin: No rash noted. No erythema.  Vitals reviewed.         Assessment &  Plan:  Right-sided chest pain - Plan: CBC with Differential/Platelet, COMPLETE METABOLIC PANEL WITH GFR, DG Chest 2 View  I suspect that this is a chest wall muscle strain possibly from driving a scooter. Another possibility would be thoracic radicular pain from pinched nerve in the thoracic spine. Obtain a chest x-ray to rule out rib fracture or pulmonary pathology. Check CBC and CMP. If the above studies are normal, allowing 1-2 weeks for symptoms to improve. If symptoms do not improve at that time, consider a thoracic MRI versus CT scan of the abdomen.

## 2016-07-05 LAB — CBC WITH DIFFERENTIAL/PLATELET
Basophils Absolute: 58 cells/uL (ref 0–200)
Basophils Relative: 1 %
EOS ABS: 116 {cells}/uL (ref 15–500)
Eosinophils Relative: 2 %
HEMATOCRIT: 43.2 % (ref 35.0–45.0)
Hemoglobin: 14.2 g/dL (ref 12.0–15.0)
LYMPHS PCT: 29 %
Lymphs Abs: 1682 cells/uL (ref 850–3900)
MCH: 32.9 pg (ref 27.0–33.0)
MCHC: 32.9 g/dL (ref 32.0–36.0)
MCV: 100.2 fL — AB (ref 80.0–100.0)
MONO ABS: 464 {cells}/uL (ref 200–950)
MONOS PCT: 8 %
MPV: 10.1 fL (ref 7.5–12.5)
NEUTROS PCT: 60 %
Neutro Abs: 3480 cells/uL (ref 1500–7800)
PLATELETS: 196 10*3/uL (ref 140–400)
RBC: 4.31 MIL/uL (ref 3.80–5.10)
RDW: 13.3 % (ref 11.0–15.0)
WBC: 5.8 10*3/uL (ref 3.8–10.8)

## 2016-07-05 LAB — COMPLETE METABOLIC PANEL WITH GFR
ALT: 16 U/L (ref 6–29)
AST: 19 U/L (ref 10–35)
Albumin: 4 g/dL (ref 3.6–5.1)
Alkaline Phosphatase: 73 U/L (ref 33–130)
BILIRUBIN TOTAL: 0.4 mg/dL (ref 0.2–1.2)
BUN: 23 mg/dL (ref 7–25)
CALCIUM: 9.3 mg/dL (ref 8.6–10.4)
CHLORIDE: 102 mmol/L (ref 98–110)
CO2: 29 mmol/L (ref 20–31)
CREATININE: 0.73 mg/dL (ref 0.60–0.93)
GFR, EST NON AFRICAN AMERICAN: 82 mL/min (ref >=60)
Glucose, Bld: 84 mg/dL (ref 70–99)
Potassium: 4.8 mmol/L (ref 3.5–5.3)
Sodium: 140 mmol/L (ref 135–146)
Total Protein: 6.6 g/dL (ref 6.1–8.1)

## 2016-07-12 ENCOUNTER — Ambulatory Visit (INDEPENDENT_AMBULATORY_CARE_PROVIDER_SITE_OTHER): Payer: PPO | Admitting: Family Medicine

## 2016-07-12 ENCOUNTER — Encounter: Payer: Self-pay | Admitting: Family Medicine

## 2016-07-12 VITALS — BP 130/70 | HR 78 | Temp 97.9°F | Resp 18 | Ht 60.0 in | Wt 208.0 lb

## 2016-07-12 DIAGNOSIS — L723 Sebaceous cyst: Secondary | ICD-10-CM | POA: Diagnosis not present

## 2016-07-12 NOTE — Progress Notes (Signed)
Subjective:    Patient ID: Christina Lyons, female    DOB: 07-27-1940, 76 y.o.   MRN: [id number]  HPI Inflamed sebeceous cyst on center of her chest for 2 days.  2 cm in diameter, red and tender.   Past Medical History:  Diagnosis Date  . DDD (degenerative disc disease), lumbar   . GERD (gastroesophageal reflux disease)   . Hyperlipidemia   . Hypertension   . Osteomyelitis ([place]) 1946   both legs  . Osteopenia   . Other idiopathic scoliosis, thoracolumbar region    Past Surgical History:  Procedure Laterality Date  . ABDOMINAL HYSTERECTOMY    . CHOLECYSTECTOMY     Current Outpatient Prescriptions on File Prior to Visit  Medication Sig Dispense Refill  . albuterol (PROVENTIL HFA;VENTOLIN HFA) 108 (90 Base) MCG/ACT inhaler Inhale 2 puffs into the lungs every 6 (six) hours as needed for wheezing or shortness of breath. 1 Inhaler 2  . aspirin EC 81 MG tablet Take 81 mg by mouth daily.    . cholecalciferol (VITAMIN D) 1000 units tablet Take 1,000 Units by mouth 2 (two) times daily.    Marland Kitchen HYDROcodone-acetaminophen (NORCO/VICODIN) 5-325 MG tablet Take 1 tablet by mouth every 6 (six) hours as needed. 60 tablet 0  . LORazepam (ATIVAN) 1 MG tablet TAKE ONE TABLET BY MOUTH TWICE DAILY AS NEEDED FOR ANXIETY 60 tablet 0  . losartan (COZAAR) 50 MG tablet Take once a day, stop lisinopril. 90 tablet 3  . Multiple Vitamin (MULTIVITAMIN) capsule Take 1 capsule by mouth daily.    . Omega-3 Fatty Acids (FISH OIL) 1000 MG CPDR Take 1 capsule by mouth 2 (two) times daily.    Marland Kitchen omeprazole (PRILOSEC) 20 MG capsule TAKE ONE CAPSULE BY MOUTH TWICE DAILY 60 capsule 6  . promethazine (PHENERGAN) 25 MG tablet TAKE 1 TABLET (25 MG TOTAL) BY MOUTH EVERY 8 (EIGHT) HOURS AS NEEDED FOR NAUSEA OR VOMITING. 20 tablet 0  . Turmeric 500 MG CAPS Take 1 Can by mouth 2 (two) times daily.     No current facility-administered medications on file prior to visit.    Allergies  Allergen Reactions  . Sulfa Antibiotics  Hives   Social History   Social History  . Marital status: Widowed    Spouse name: N/A  . Number of children: N/A  . Years of education: N/A   Occupational History  . Retired    Social History Main Topics  . Smoking status: Former Smoker    Packs/day: 0.50    Years: 5.00    Types: Cigarettes    Quit date: 04/01/1980  . Smokeless tobacco: Never Used  . Alcohol use No  . Drug use: No  . Sexual activity: Not on file   Other Topics Concern  . Not on file   Social History Narrative  . No narrative on file      Review of Systems  All other systems reviewed and are negative.      Objective:   Physical Exam  Cardiovascular: Normal rate, regular rhythm, normal heart sounds and intact distal pulses.   No murmur heard. Pulmonary/Chest: Effort normal and breath sounds normal. No respiratory distress. She has no wheezes. She has no rales. She exhibits no tenderness.  Musculoskeletal: She exhibits no tenderness or deformity.  Skin: No rash noted. There is erythema.  Vitals reviewed.         Assessment & Plan:  Inflamed sebaceous cyst  Anesthetized with 0.1% lidocaine and lanced with scaple.  Copious material expressed from cyst sac.  Packed with 2 inch of gauze.  Wound care discussed.

## 2016-07-23 ENCOUNTER — Ambulatory Visit (INDEPENDENT_AMBULATORY_CARE_PROVIDER_SITE_OTHER): Payer: PPO | Admitting: Podiatry

## 2016-07-23 ENCOUNTER — Encounter: Payer: Self-pay | Admitting: Podiatry

## 2016-07-23 ENCOUNTER — Ambulatory Visit (INDEPENDENT_AMBULATORY_CARE_PROVIDER_SITE_OTHER): Payer: PPO

## 2016-07-23 DIAGNOSIS — M2042 Other hammer toe(s) (acquired), left foot: Secondary | ICD-10-CM | POA: Diagnosis not present

## 2016-07-23 NOTE — Progress Notes (Signed)
   Subjective:    Patient ID: Christina Lyons, female    DOB: October 26, 1940, 76 y.o.   MRN: [id number]  HPI: She presents that she complained of painful lesions to the medial aspect of the fifth digit left foot.    Review of Systems  Musculoskeletal: Positive for arthralgias.  All other systems reviewed and are negative.      Objective:   Physical Exam: Vital signs are stable alert and oriented 3 pulses are palpable. Severe hammertoe deformities but inform his noted bilateral this is resulting in plantar porokeratosis was no open lesions are noted. She does have a reactive porokeratotic lesion to the medial aspect of the fifth digit left foot. Radiographs taken of the left foot demonstrates a hammertoe deformities and plantarflexed metatarsals which are resulting in her porokeratotic lesions.        Assessment & Plan:  Porokeratosis secondary to severe hammertoe deformities and plantar flexed metatarsals.  Plan: Debrided all reactive hyperkeratosis today and placed digital's silicone pads and I will follow-up with her as needed.

## 2016-07-29 ENCOUNTER — Other Ambulatory Visit: Payer: Self-pay | Admitting: Family Medicine

## 2016-07-29 NOTE — Telephone Encounter (Signed)
ok 

## 2016-07-29 NOTE — Telephone Encounter (Signed)
Ok to refill 

## 2016-07-30 ENCOUNTER — Other Ambulatory Visit: Payer: Self-pay | Admitting: Family Medicine

## 2016-07-30 NOTE — Telephone Encounter (Signed)
Medication called to pharmacy. 

## 2016-07-30 NOTE — Telephone Encounter (Signed)
ok 

## 2016-07-30 NOTE — Telephone Encounter (Signed)
Ok to refill 

## 2016-08-18 ENCOUNTER — Other Ambulatory Visit: Payer: Self-pay | Admitting: Family Medicine

## 2016-09-05 DIAGNOSIS — H43393 Other vitreous opacities, bilateral: Secondary | ICD-10-CM | POA: Diagnosis not present

## 2016-09-05 DIAGNOSIS — H43812 Vitreous degeneration, left eye: Secondary | ICD-10-CM | POA: Diagnosis not present

## 2016-09-05 DIAGNOSIS — H35363 Drusen (degenerative) of macula, bilateral: Secondary | ICD-10-CM | POA: Diagnosis not present

## 2016-09-05 DIAGNOSIS — H26493 Other secondary cataract, bilateral: Secondary | ICD-10-CM | POA: Diagnosis not present

## 2016-09-11 ENCOUNTER — Encounter: Payer: Self-pay | Admitting: Physician Assistant

## 2016-09-11 ENCOUNTER — Ambulatory Visit (INDEPENDENT_AMBULATORY_CARE_PROVIDER_SITE_OTHER): Payer: PPO | Admitting: Physician Assistant

## 2016-09-11 VITALS — BP 130/84 | HR 79 | Temp 98.0°F | Resp 16 | Wt 207.6 lb

## 2016-09-11 DIAGNOSIS — J0100 Acute maxillary sinusitis, unspecified: Secondary | ICD-10-CM

## 2016-09-11 MED ORDER — AMOXICILLIN-POT CLAVULANATE 875-125 MG PO TABS: 1.0000 | ORAL_TABLET | Freq: Two times a day (BID) | ORAL | 0 refills | Status: DC

## 2016-09-11 MED ORDER — PREDNISONE 20 MG PO TABS: ORAL_TABLET | ORAL | 0 refills | Status: DC

## 2016-09-11 NOTE — Progress Notes (Signed)
Patient ID: Christina Lyons MRN: [id number], DOB: 1940/05/30, 76 y.o. Date of Encounter: 09/11/2016, 4:52 PM    Chief Complaint:  Chief Complaint  Patient presents with  . sinus problems    x1wk  . Headache     HPI: 76 y.o. year old female presents with above.   Dates that about 8 days ago she started feeling some pressure and congestion behind her left ear. In started developing pressure and discomfort in her sinuses. Has used chloracetic and in multiple over-the-counter medications with minimal relief. In sensation in her nose. Is continuing to have increasing pressure and congestion in her sinuses and ears. Not blowing much of anything out of her nose. Has felt no chest congestion and has had no cough. No sore throat no fevers or chills.     Home Meds:   Outpatient Medications Prior to Visit  Medication Sig Dispense Refill  . albuterol (PROVENTIL HFA;VENTOLIN HFA) 108 (90 Base) MCG/ACT inhaler Inhale 2 puffs into the lungs every 6 (six) hours as needed for wheezing or shortness of breath. 1 Inhaler 2  . aspirin EC 81 MG tablet Take 81 mg by mouth daily.    . cholecalciferol (VITAMIN D) 1000 units tablet Take 1,000 Units by mouth 2 (two) times daily.    Marland Kitchen HYDROcodone-acetaminophen (NORCO/VICODIN) 5-325 MG tablet Take 1 tablet by mouth every 6 (six) hours as needed. 60 tablet 0  . LORazepam (ATIVAN) 1 MG tablet TAKE ONE TABLET BY MOUTH TWICE DAILY AS NEEDED FOR ANXIETY 60 tablet 2  . losartan (COZAAR) 50 MG tablet TAKE 1 TABLET BY MOUTH EVERY DAY. STOP LISINOPRIL. 90 tablet 3  . Multiple Vitamin (MULTIVITAMIN) capsule Take 1 capsule by mouth daily.    . Omega-3 Fatty Acids (FISH OIL) 1000 MG CPDR Take 1 capsule by mouth 2 (two) times daily.    Marland Kitchen omeprazole (PRILOSEC) 20 MG capsule TAKE ONE CAPSULE BY MOUTH TWICE DAILY 60 capsule 6  . Turmeric 500 MG CAPS Take 1 Can by mouth 2 (two) times daily.    Marland Kitchen losartan (COZAAR) 50 MG tablet      No facility-administered medications prior  to visit.     Allergies:  Allergies  Allergen Reactions  . Sulfa Antibiotics Hives      Review of Systems: See HPI for pertinent ROS. All other ROS negative.    Physical Exam: Blood pressure 130/84, pulse 79, temperature 98 F (36.7 C), temperature source Oral, resp. rate 16, weight 207 lb 9.6 oz (94.2 kg), SpO2 97 %., Body mass index is 40.54 kg/m. General: WNWD WF.  Appears in no acute distress. HEENT: Normocephalic, atraumatic, eyes without discharge, sclera non-icteric, nares are without discharge. Bilateral auditory canals clear, TM's are without perforation, pearly grey and translucent with reflective cone of light bilaterally. Oral cavity moist, posterior pharynx without exudate, erythema, peritonsillar abscess. Positive tenderness with percussion to frontal sinuses. Minimal tenderness with percussion to maxillary sinuses bilaterally.  Neck: Supple. No thyromegaly. No lymphadenopathy. Lungs: Clear bilaterally to auscultation without wheezes, rales, or rhonchi. Breathing is unlabored. Heart: Regular rhythm. No murmurs, rubs, or gallops. Msk:  Strength and tone normal for age. Extremities/Skin: Warm and dry.  Neuro: Alert and oriented X 3. Moves all extremities spontaneously. Gait is normal. CNII-XII grossly in tact. Psych:  Responds to questions appropriately with a normal affect.     ASSESSMENT AND PLAN:  76 y.o. year old female with  1. Acute maxillary sinusitis, recurrence not specified She is to take the Augmentin  and the prednisone as directed. Follow-up if symptoms do not resolve upon completion of these. - amoxicillin-clavulanate (AUGMENTIN) 875-125 MG tablet; Take 1 tablet by mouth 2 (two) times daily.  Dispense: 20 tablet; Refill: 0 - predniSONE (DELTASONE) 20 MG tablet; Take 3 daily for 2 days, then 2 daily for 2 days, then 1 daily for 2 days.  Dispense: 12 tablet; Refill: 0   Signed, [address] [place], [place], [facility] 09/11/2016 4:52 PM

## 2016-10-09 ENCOUNTER — Telehealth: Payer: Self-pay | Admitting: Family Medicine

## 2016-10-09 NOTE — Telephone Encounter (Signed)
Ok to refill 

## 2016-10-09 NOTE — Telephone Encounter (Signed)
Pt needs refill on hydrocodone.  °

## 2016-10-10 MED ORDER — HYDROCODONE-ACETAMINOPHEN 5-325 MG PO TABS: 1.0000 | ORAL_TABLET | Freq: Four times a day (QID) | ORAL | 0 refills | Status: DC | PRN

## 2016-10-10 NOTE — Telephone Encounter (Signed)
ok 

## 2016-10-10 NOTE — Telephone Encounter (Signed)
RX printed, left up front and patient aware to pick up after 2 pm today 

## 2016-12-25 ENCOUNTER — Other Ambulatory Visit: Payer: Self-pay | Admitting: Family Medicine

## 2016-12-25 ENCOUNTER — Telehealth: Payer: Self-pay | Admitting: Family Medicine

## 2016-12-25 NOTE — Telephone Encounter (Signed)
Ok to refill 

## 2016-12-25 NOTE — Telephone Encounter (Signed)
Pt called asking for hydrocodone refill.

## 2016-12-26 MED ORDER — HYDROCODONE-ACETAMINOPHEN 5-325 MG PO TABS: 1.0000 | ORAL_TABLET | Freq: Four times a day (QID) | ORAL | 0 refills | Status: DC | PRN

## 2016-12-26 NOTE — Addendum Note (Signed)
Addended by: Shary Decamp B on: 12/26/2016 09:55 AM   Modules accepted: Orders

## 2016-12-26 NOTE — Telephone Encounter (Signed)
RX printed, left up front and patient aware to pick up via vm 

## 2016-12-26 NOTE — Telephone Encounter (Signed)
ok 

## 2016-12-27 ENCOUNTER — Other Ambulatory Visit: Payer: Self-pay | Admitting: Family Medicine

## 2017-01-02 ENCOUNTER — Encounter: Payer: Self-pay | Admitting: Physician Assistant

## 2017-01-02 ENCOUNTER — Ambulatory Visit (INDEPENDENT_AMBULATORY_CARE_PROVIDER_SITE_OTHER): Payer: PPO | Admitting: Physician Assistant

## 2017-01-02 VITALS — BP 128/74 | HR 64 | Temp 98.0°F | Resp 16 | Ht 60.0 in | Wt 203.0 lb

## 2017-01-02 DIAGNOSIS — J0100 Acute maxillary sinusitis, unspecified: Secondary | ICD-10-CM | POA: Diagnosis not present

## 2017-01-02 MED ORDER — AMOXICILLIN-POT CLAVULANATE 875-125 MG PO TABS: 1.0000 | ORAL_TABLET | Freq: Two times a day (BID) | ORAL | 0 refills | Status: DC

## 2017-01-02 MED ORDER — PREDNISONE 20 MG PO TABS: ORAL_TABLET | ORAL | 0 refills | Status: DC

## 2017-01-02 NOTE — Progress Notes (Signed)
Patient ID: Christina Lyons MRN: [id number], DOB: 07-08-1940, 76 y.o. Date of Encounter: 01/02/2017, 4:17 PM    Chief Complaint:  Chief Complaint  Patient presents with  . Illness    sinus pressure, hoarse, HA, chest congestion     HPI: 76 y.o. year old female presents with above.   States that her head feels stopped up. She feels pressure in her ears. Her nose was burning last night. Says that this morning she felt drainage going down her throat causing a little bit of cough there but has not had any congestion deeper in her chest. Says that she is using Flonase and Cloricedin and another pill but that makes her drowsy so she can only use that one at night. Says that she "just doesn't know what else to do". Says "feels miserable when you're head feels like his gout is pressure and congestion."     Home Meds:   Outpatient Medications Prior to Visit  Medication Sig Dispense Refill  . albuterol (PROVENTIL HFA;VENTOLIN HFA) 108 (90 Base) MCG/ACT inhaler Inhale 2 puffs into the lungs every 6 (six) hours as needed for wheezing or shortness of breath. 1 Inhaler 2  . aspirin EC 81 MG tablet Take 81 mg by mouth daily.    . cholecalciferol (VITAMIN D) 1000 units tablet Take 1,000 Units by mouth 2 (two) times daily.    Marland Kitchen HYDROcodone-acetaminophen (NORCO/VICODIN) 5-325 MG tablet Take 1 tablet by mouth every 6 (six) hours as needed. 60 tablet 0  . LORazepam (ATIVAN) 1 MG tablet TAKE 1 TABLET BY MOUTH TWICE DAILY AS NEEDED FOR ANXIETY 60 tablet 2  . losartan (COZAAR) 50 MG tablet TAKE 1 TABLET BY MOUTH EVERY DAY. STOP LISINOPRIL. 90 tablet 3  . Multiple Vitamin (MULTIVITAMIN) capsule Take 1 capsule by mouth daily.    . Omega-3 Fatty Acids (FISH OIL) 1000 MG CPDR Take 1 capsule by mouth 2 (two) times daily.    Marland Kitchen omeprazole (PRILOSEC) 20 MG capsule TAKE ONE CAPSULE BY MOUTH TWICE DAILY 60 capsule 6  . Turmeric 500 MG CAPS Take 1 Can by mouth 2 (two) times daily.    Marland Kitchen amoxicillin-clavulanate  (AUGMENTIN) 875-125 MG tablet Take 1 tablet by mouth 2 (two) times daily. (Patient not taking: Reported on 01/02/2017) 20 tablet 0  . predniSONE (DELTASONE) 20 MG tablet Take 3 daily for 2 days, then 2 daily for 2 days, then 1 daily for 2 days. (Patient not taking: Reported on 01/02/2017) 12 tablet 0   No facility-administered medications prior to visit.     Allergies:  Allergies  Allergen Reactions  . Sulfa Antibiotics Hives      Review of Systems: See HPI for pertinent ROS. All other ROS negative.    Physical Exam: Blood pressure 128/74, pulse 64, temperature 98 F (36.7 C), temperature source Oral, resp. rate 16, height 5' (1.524 m), weight 92.1 kg (203 lb), SpO2 94 %., Body mass index is 39.65 kg/m. General:  Obese WF. Appears in no acute distress. HEENT: Normocephalic, atraumatic, eyes without discharge, sclera non-icteric, nares are without discharge. Bilateral auditory canals clear, TM's are without perforation. Left TM is clear. Right TM is dull with golden/reddish color.  Oral cavity moist, posterior pharynx without exudate, erythema, peritonsillar abscess. Positive tenderness with percussion to sinuses bilaterally.  Neck: Supple. No thyromegaly. No lymphadenopathy. Lungs: Clear bilaterally to auscultation without wheezes, rales, or rhonchi. Breathing is unlabored. Heart: Regular rhythm. No murmurs, rubs, or gallops. Msk:  Strength and tone normal for  age. Extremities/Skin: Warm and dry.  Neuro: Alert and oriented X 3. Moves all extremities spontaneously. Gait is normal. CNII-XII grossly in tact. Psych:  Responds to questions appropriately with a normal affect.     ASSESSMENT AND PLAN:  76 y.o. year old female with  1. Acute maxillary sinusitis, recurrence not specified She is to take the Augmentin and the prednisone as directed. Follow-up if symptoms do not resolve upon completion of these. - predniSONE (DELTASONE) 20 MG tablet; Take 3 daily for 2 days, then 2 daily for 2  days, then 1 daily for 2 days.  Dispense: 12 tablet; Refill: 0 - amoxicillin-clavulanate (AUGMENTIN) 875-125 MG tablet; Take 1 tablet by mouth 2 (two) times daily.  Dispense: 20 tablet; Refill: 0   Signed, [address] [place], [place], [facility] 01/02/2017 4:17 PM

## 2017-01-06 ENCOUNTER — Other Ambulatory Visit: Payer: Self-pay | Admitting: Family Medicine

## 2017-03-07 ENCOUNTER — Telehealth: Payer: Self-pay

## 2017-03-07 DIAGNOSIS — I10 Essential (primary) hypertension: Secondary | ICD-10-CM | POA: Diagnosis not present

## 2017-03-07 DIAGNOSIS — J069 Acute upper respiratory infection, unspecified: Secondary | ICD-10-CM | POA: Diagnosis not present

## 2017-03-07 DIAGNOSIS — J018 Other acute sinusitis: Secondary | ICD-10-CM | POA: Diagnosis not present

## 2017-03-07 DIAGNOSIS — H6982 Other specified disorders of Eustachian tube, left ear: Secondary | ICD-10-CM | POA: Diagnosis not present

## 2017-03-07 NOTE — Telephone Encounter (Signed)
Patient called complaining of a headache, nausea, dizzy spells and ear pain . Patient denies having vomiting, patient denies having numbness or tingling in arm. Patient states she has taken tylenol and sinus medicine and is not getting relief.  When I asked what her blood pressure was patient was not able provide any readings.  Patient was instructed to go to an urgent care to seek medical attention

## 2017-03-14 DIAGNOSIS — J018 Other acute sinusitis: Secondary | ICD-10-CM | POA: Diagnosis not present

## 2017-03-14 DIAGNOSIS — I10 Essential (primary) hypertension: Secondary | ICD-10-CM | POA: Diagnosis not present

## 2017-04-01 ENCOUNTER — Other Ambulatory Visit: Payer: Self-pay | Admitting: Family Medicine

## 2017-04-02 ENCOUNTER — Other Ambulatory Visit: Payer: Self-pay | Admitting: Family Medicine

## 2017-04-02 NOTE — Telephone Encounter (Signed)
Patient is requesting a refill on Hydrocodone   LOV: 07/12/16  LRF:   12/26/16

## 2017-04-02 NOTE — Telephone Encounter (Signed)
Pt needs refill on hydrocodone sent to [facility]

## 2017-04-02 NOTE — Addendum Note (Signed)
Addended by: Shary Decamp B on: 04/02/2017 02:40 PM   Modules accepted: Orders

## 2017-04-03 MED ORDER — HYDROCODONE-ACETAMINOPHEN 5-325 MG PO TABS: 1.0000 | ORAL_TABLET | Freq: Four times a day (QID) | ORAL | 0 refills | Status: DC | PRN

## 2017-04-15 ENCOUNTER — Ambulatory Visit (INDEPENDENT_AMBULATORY_CARE_PROVIDER_SITE_OTHER): Payer: PPO | Admitting: Podiatry

## 2017-04-15 ENCOUNTER — Encounter: Payer: Self-pay | Admitting: Podiatry

## 2017-04-15 DIAGNOSIS — Q828 Other specified congenital malformations of skin: Secondary | ICD-10-CM

## 2017-04-15 NOTE — Progress Notes (Signed)
She presents today chief complaint of painful calluses to the plantar aspect of the bilateral foot.  She states that they have been bothering her for quite some time now she has not been able to come since April of last year.  Objective: Vital signs are stable she is alert and oriented x3 pulses are strongly palpable.  Neurologic sensorium is intact.  Severe hammertoe deformities bilateral resulting in plantar flexed metatarsals and reactive hyperkeratosis and tylomas to the plantar aspect of the forefoot bilaterally.  She also has distal clavi.  No open lesions or wounds are noted.  Assessment: Pain in limb secondary to porokeratosis plantar flexed metatarsals and hammertoes.  Plan: Debrided all reactive hyperkeratosis in place padding today.  Follow-up with her on an as-needed basis.

## 2017-05-13 DIAGNOSIS — H903 Sensorineural hearing loss, bilateral: Secondary | ICD-10-CM | POA: Diagnosis not present

## 2017-05-13 DIAGNOSIS — R51 Headache: Secondary | ICD-10-CM | POA: Diagnosis not present

## 2017-05-14 ENCOUNTER — Other Ambulatory Visit (INDEPENDENT_AMBULATORY_CARE_PROVIDER_SITE_OTHER): Payer: Self-pay | Admitting: Otolaryngology

## 2017-05-14 DIAGNOSIS — H539 Unspecified visual disturbance: Secondary | ICD-10-CM

## 2017-05-22 ENCOUNTER — Ambulatory Visit (INDEPENDENT_AMBULATORY_CARE_PROVIDER_SITE_OTHER): Payer: PPO | Admitting: Family Medicine

## 2017-05-22 ENCOUNTER — Encounter: Payer: Self-pay | Admitting: Family Medicine

## 2017-05-22 VITALS — BP 130/90 | HR 72 | Temp 98.1°F | Resp 18 | Ht 60.0 in | Wt 209.0 lb

## 2017-05-22 DIAGNOSIS — Z0000 Encounter for general adult medical examination without abnormal findings: Secondary | ICD-10-CM | POA: Diagnosis not present

## 2017-05-22 DIAGNOSIS — M810 Age-related osteoporosis without current pathological fracture: Secondary | ICD-10-CM

## 2017-05-22 DIAGNOSIS — M5136 Other intervertebral disc degeneration, lumbar region: Secondary | ICD-10-CM | POA: Diagnosis not present

## 2017-05-22 DIAGNOSIS — I10 Essential (primary) hypertension: Secondary | ICD-10-CM | POA: Diagnosis not present

## 2017-05-22 DIAGNOSIS — E7800 Pure hypercholesterolemia, unspecified: Secondary | ICD-10-CM | POA: Diagnosis not present

## 2017-05-22 MED ORDER — HYDROCHLOROTHIAZIDE 25 MG PO TABS: 25.0000 mg | ORAL_TABLET | Freq: Every day | ORAL | 3 refills | Status: DC

## 2017-05-22 NOTE — Progress Notes (Signed)
Subjective:    Patient ID: Christina Lyons, female    DOB: 07/10/1940, 77 y.o.   MRN: [id number]  HPI  Here for CPE. DEXA in 2017 showed osteoporosis in right hip with T score -2.5.  Last colonoscopy was 2012.  Last mammogram 05/2016.  She had her flu shot in January at a pharmacy.  She is due for the shingles vaccine.  She discontinued Fosamax in the past due to acid reflux but she is interested in prolia.  She is also interested in switching losartan due to the recent recall.  Her blood pressure today is well controlled.  The remainder of her review of systems is negative  Immunization History  Administered Date(s) Administered  . Influenza,inj,Quad PF,6+ Mos 01/17/2014, 01/11/2015  . Influenza-Unspecified 03/05/2011, 12/30/2011, 01/30/2013, 02/08/2016  . Pneumococcal Conjugate-13 03/01/2014  . Pneumococcal Polysaccharide-23 12/10/2010    Past Medical History:  Diagnosis Date  . DDD (degenerative disc disease), lumbar   . GERD (gastroesophageal reflux disease)   . Hyperlipidemia   . Hypertension   . Osteomyelitis ([place]) 1946   both legs  . Osteopenia   . Other idiopathic scoliosis, thoracolumbar region    Past Surgical History:  Procedure Laterality Date  . ABDOMINAL HYSTERECTOMY    . CHOLECYSTECTOMY    Right knee replacement Current Outpatient Medications on File Prior to Visit  Medication Sig Dispense Refill  . albuterol (PROVENTIL HFA;VENTOLIN HFA) 108 (90 Base) MCG/ACT inhaler Inhale 2 puffs into the lungs every 6 (six) hours as needed for wheezing or shortness of breath. 1 Inhaler 2  . aspirin EC 81 MG tablet Take 81 mg by mouth daily.    . cholecalciferol (VITAMIN D) 1000 units tablet Take 1,000 Units by mouth 2 (two) times daily.    Marland Kitchen HYDROcodone-acetaminophen (NORCO/VICODIN) 5-325 MG tablet Take 1 tablet by mouth every 6 (six) hours as needed. 60 tablet 0  . LORazepam (ATIVAN) 1 MG tablet TAKE 1 TABLET BY MOUTH TWICE DAILY AS NEEDED FOR ANXIETY 60 tablet 2  . losartan  (COZAAR) 50 MG tablet TAKE 1 TABLET BY MOUTH EVERY DAY. STOP LISINOPRIL. 90 tablet 3  . Multiple Vitamin (MULTIVITAMIN) capsule Take 1 capsule by mouth daily.    . Omega-3 Fatty Acids (FISH OIL) 1000 MG CPDR Take 1 capsule by mouth 2 (two) times daily.    Marland Kitchen omeprazole (PRILOSEC) 20 MG capsule TAKE ONE CAPSULE BY MOUTH TWICE DAILY 60 capsule 6  . promethazine (PHENERGAN) 25 MG tablet TAKE 1 TABLET (25 MG TOTAL) BY MOUTH EVERY 8 (EIGHT) HOURS AS NEEDED FOR NAUSEA OR VOMITING. 20 tablet 0  . Turmeric 500 MG CAPS Take 1 Can by mouth 2 (two) times daily.     No current facility-administered medications on file prior to visit.    Allergies  Allergen Reactions  . Sulfa Antibiotics Hives   Social History   Socioeconomic History  . Marital status: Widowed    Spouse name: Not on file  . Number of children: Not on file  . Years of education: Not on file  . Highest education level: Not on file  Social Needs  . Financial resource strain: Not on file  . Food insecurity - worry: Not on file  . Food insecurity - inability: Not on file  . Transportation needs - medical: Not on file  . Transportation needs - non-medical: Not on file  Occupational History  . Occupation: Retired  Tobacco Use  . Smoking status: Former Smoker    Packs/day: 0.50  Years: 5.00    Pack years: 2.50    Types: Cigarettes    Last attempt to quit: 04/01/1980    Years since quitting: 37.1  . Smokeless tobacco: Never Used  Substance and Sexual Activity  . Alcohol use: No    Alcohol/week: 0.0 oz  . Drug use: No  . Sexual activity: Not on file  Other Topics Concern  . Not on file  Social History Narrative  . Not on file   Family History  Problem Relation Age of Onset  . Asthma Mother   . Asthma Sister   . Heart failure Father   . Breast cancer Sister   . Breast cancer Maternal Aunt      Review of Systems  All other systems reviewed and are negative.      Objective:   Physical Exam  Constitutional: She  is oriented to person, place, and time. She appears well-developed and well-nourished. No distress.  HENT:  Head: Normocephalic and atraumatic.  Right Ear: External ear normal.  Left Ear: External ear normal.  Nose: Nose normal.  Mouth/Throat: Oropharynx is clear and moist. No oropharyngeal exudate.  Eyes: Conjunctivae and EOM are normal. Pupils are equal, round, and reactive to light. Right eye exhibits no discharge. Left eye exhibits no discharge. No scleral icterus.  Neck: Normal range of motion. Neck supple. No JVD present. No tracheal deviation present. No thyromegaly present.  Cardiovascular: Normal rate, regular rhythm, normal heart sounds and intact distal pulses. Exam reveals no gallop and no friction rub.  No murmur heard. Pulmonary/Chest: Effort normal and breath sounds normal. No stridor. No respiratory distress. She has no wheezes. She has no rales. She exhibits no tenderness.  Abdominal: Soft. Bowel sounds are normal. She exhibits no distension and no mass. There is no tenderness. There is no rebound and no guarding.  Musculoskeletal: Normal range of motion. She exhibits no edema, tenderness or deformity.  Lymphadenopathy:    She has no cervical adenopathy.  Neurological: She is alert and oriented to person, place, and time. She has normal reflexes. She displays normal reflexes. No cranial nerve deficit. She exhibits normal muscle tone. Coordination normal.  Skin: Skin is warm. No rash noted. She is not diaphoretic. No erythema. No pallor.  Psychiatric: She has a normal mood and affect. Her behavior is normal. Judgment and thought content normal.  Vitals reviewed.         Assessment & Plan:  General medical exam  Pure hypercholesterolemia  Essential hypertension - Plan: CBC with Differential/Platelet, COMPLETE METABOLIC PANEL WITH GFR, Lipid panel  Osteoporosis, unspecified osteoporosis type, unspecified pathological fracture presence - Plan: VITAMIN D 25 Hydroxy (Vit-D  Deficiency, Fractures)  DDD (degenerative disc disease), lumbar  I will schedule the patient for prolia injections every 6 months.  I will also check a vitamin D level.  Her blood pressure today is acceptable but we will discontinue losartan and replace it with hydrochlorothiazide 25 mg daily due to the recent recall.  Her immunizations are up-to-date.  I did recommend the shingles vaccine as long as it is financially reasonable.  She will check on the price.  She is scheduling her mammogram for next month.  Colonoscopy is up-to-date.  She does not require a Pap smear.  I would repeat a bone density test next year.  The remainder of her preventative care is up-to-date

## 2017-05-23 LAB — CBC WITH DIFFERENTIAL/PLATELET
BASOS ABS: 51 {cells}/uL (ref 0–200)
BASOS PCT: 1.1 %
EOS ABS: 193 {cells}/uL (ref 15–500)
Eosinophils Relative: 4.2 %
HEMATOCRIT: 39.8 % (ref 35.0–45.0)
HEMOGLOBIN: 13.5 g/dL (ref 11.7–15.5)
LYMPHS ABS: 1518 {cells}/uL (ref 850–3900)
MCH: 33.3 pg — AB (ref 27.0–33.0)
MCHC: 33.9 g/dL (ref 32.0–36.0)
MCV: 98.3 fL (ref 80.0–100.0)
MONOS PCT: 9 %
MPV: 10 fL (ref 7.5–12.5)
NEUTROS ABS: 2424 {cells}/uL (ref 1500–7800)
Neutrophils Relative %: 52.7 %
Platelets: 177 10*3/uL (ref 140–400)
RBC: 4.05 10*6/uL (ref 3.80–5.10)
RDW: 11.5 % (ref 11.0–15.0)
Total Lymphocyte: 33 %
WBC: 4.6 10*3/uL (ref 3.8–10.8)
WBCMIX: 414 {cells}/uL (ref 200–950)

## 2017-05-23 LAB — LIPID PANEL
CHOL/HDL RATIO: 3.5 (calc) (ref <5.0)
Cholesterol: 239 mg/dL — ABNORMAL HIGH (ref <200)
HDL: 69 mg/dL (ref >50)
LDL CHOLESTEROL (CALC): 149 mg/dL — AB
Non-HDL Cholesterol (Calc): 170 mg/dL (calc) — ABNORMAL HIGH (ref <130)
Triglycerides: 97 mg/dL (ref <150)

## 2017-05-23 LAB — COMPLETE METABOLIC PANEL WITH GFR
AG Ratio: 2 (calc) (ref 1.0–2.5)
ALBUMIN MSPROF: 4 g/dL (ref 3.6–5.1)
ALKALINE PHOSPHATASE (APISO): 70 U/L (ref 33–130)
ALT: 15 U/L (ref 6–29)
AST: 17 U/L (ref 10–35)
BILIRUBIN TOTAL: 0.4 mg/dL (ref 0.2–1.2)
BUN / CREAT RATIO: 33 (calc) — AB (ref 6–22)
BUN: 19 mg/dL (ref 7–25)
CHLORIDE: 108 mmol/L (ref 98–110)
CO2: 28 mmol/L (ref 20–32)
CREATININE: 0.57 mg/dL — AB (ref 0.60–0.93)
Calcium: 9.2 mg/dL (ref 8.6–10.4)
GFR, EST AFRICAN AMERICAN: 106 mL/min/{1.73_m2} (ref >=60)
GFR, Est Non African American: 91 mL/min/{1.73_m2} (ref >=60)
GLOBULIN: 2 g/dL (ref 1.9–3.7)
GLUCOSE: 87 mg/dL (ref 65–99)
Potassium: 4.9 mmol/L (ref 3.5–5.3)
Sodium: 144 mmol/L (ref 135–146)
TOTAL PROTEIN: 6 g/dL — AB (ref 6.1–8.1)

## 2017-05-23 LAB — VITAMIN D 25 HYDROXY (VIT D DEFICIENCY, FRACTURES): VIT D 25 HYDROXY: 60 ng/mL (ref 30–100)

## 2017-05-28 ENCOUNTER — Other Ambulatory Visit: Payer: Self-pay | Admitting: Family Medicine

## 2017-05-28 MED ORDER — PRAVASTATIN SODIUM 20 MG PO TABS
20.0000 mg | ORAL_TABLET | Freq: Every day | ORAL | 3 refills | Status: DC
Start: 2017-05-28 — End: 2018-05-11

## 2017-06-02 ENCOUNTER — Other Ambulatory Visit: Payer: Self-pay | Admitting: Family Medicine

## 2017-06-02 NOTE — Telephone Encounter (Signed)
Ok to refill??  Last office visit 05/22/2017.  Last refill 12/26/2016, #2 refills.

## 2017-06-03 ENCOUNTER — Other Ambulatory Visit: Payer: Self-pay

## 2017-06-03 ENCOUNTER — Encounter: Payer: Self-pay | Admitting: *Deleted

## 2017-06-03 ENCOUNTER — Ambulatory Visit (INDEPENDENT_AMBULATORY_CARE_PROVIDER_SITE_OTHER): Payer: PPO | Admitting: *Deleted

## 2017-06-03 DIAGNOSIS — M810 Age-related osteoporosis without current pathological fracture: Secondary | ICD-10-CM | POA: Diagnosis not present

## 2017-06-03 MED ORDER — DENOSUMAB 60 MG/ML ~~LOC~~ SOLN
60.0000 mg | Freq: Once | SUBCUTANEOUS | Status: AC
  Administered 2017-06-03: 60 mg via SUBCUTANEOUS

## 2017-06-22 ENCOUNTER — Ambulatory Visit
Admission: RE | Admit: 2017-06-22 | Discharge: 2017-06-22 | Disposition: A | Discharge status: Home / Self Care | Payer: PPO | Source: Ambulatory Visit | Attending: Otolaryngology | Admitting: Otolaryngology

## 2017-06-22 DIAGNOSIS — H539 Unspecified visual disturbance: Secondary | ICD-10-CM

## 2017-06-22 DIAGNOSIS — H538 Other visual disturbances: Secondary | ICD-10-CM | POA: Diagnosis not present

## 2017-06-22 MED ORDER — GADOBENATE DIMEGLUMINE 529 MG/ML IV SOLN
19.0000 mL | Freq: Once | INTRAVENOUS | Status: AC | PRN
  Administered 2017-06-22: 19 mL via INTRAVENOUS

## 2017-06-24 DIAGNOSIS — Z1231 Encounter for screening mammogram for malignant neoplasm of breast: Secondary | ICD-10-CM | POA: Diagnosis not present

## 2017-06-24 DIAGNOSIS — Z803 Family history of malignant neoplasm of breast: Secondary | ICD-10-CM | POA: Diagnosis not present

## 2017-08-01 ENCOUNTER — Other Ambulatory Visit: Payer: Self-pay | Admitting: Family Medicine

## 2017-08-15 DIAGNOSIS — L84 Corns and callosities: Secondary | ICD-10-CM | POA: Diagnosis not present

## 2017-08-15 DIAGNOSIS — M79672 Pain in left foot: Secondary | ICD-10-CM | POA: Diagnosis not present

## 2017-08-15 DIAGNOSIS — M79671 Pain in right foot: Secondary | ICD-10-CM | POA: Diagnosis not present

## 2017-08-15 DIAGNOSIS — B351 Tinea unguium: Secondary | ICD-10-CM | POA: Diagnosis not present

## 2017-09-11 DIAGNOSIS — H43393 Other vitreous opacities, bilateral: Secondary | ICD-10-CM | POA: Diagnosis not present

## 2017-09-11 DIAGNOSIS — H26493 Other secondary cataract, bilateral: Secondary | ICD-10-CM | POA: Diagnosis not present

## 2017-09-11 DIAGNOSIS — H35363 Drusen (degenerative) of macula, bilateral: Secondary | ICD-10-CM | POA: Diagnosis not present

## 2017-09-11 DIAGNOSIS — H43812 Vitreous degeneration, left eye: Secondary | ICD-10-CM | POA: Diagnosis not present

## 2017-10-14 DIAGNOSIS — R202 Paresthesia of skin: Secondary | ICD-10-CM | POA: Diagnosis not present

## 2017-10-14 DIAGNOSIS — R252 Cramp and spasm: Secondary | ICD-10-CM | POA: Diagnosis not present

## 2017-10-29 ENCOUNTER — Other Ambulatory Visit: Payer: Self-pay | Admitting: Family Medicine

## 2017-10-29 NOTE — Telephone Encounter (Signed)
Ok to refill??  Last office visit 05/22/2017.  Last refill 06/02/2017, #2 refills.

## 2017-10-30 ENCOUNTER — Other Ambulatory Visit: Payer: Self-pay | Admitting: Family Medicine

## 2017-10-30 MED ORDER — HYDROCODONE-ACETAMINOPHEN 5-325 MG PO TABS: 1.0000 | ORAL_TABLET | Freq: Four times a day (QID) | ORAL | 0 refills | Status: DC | PRN

## 2017-10-30 NOTE — Telephone Encounter (Signed)
Patient left message asking for a refill on her hydrocodone she states she had this filled last in January she only takes them when she needs them.   CB# [phone]

## 2017-10-30 NOTE — Telephone Encounter (Signed)
Ok to refill??  Last office visit 07/20/2017.  Last refill 04/03/2017.

## 2017-11-05 DIAGNOSIS — L84 Corns and callosities: Secondary | ICD-10-CM | POA: Diagnosis not present

## 2017-11-05 DIAGNOSIS — B351 Tinea unguium: Secondary | ICD-10-CM | POA: Diagnosis not present

## 2017-11-05 DIAGNOSIS — M79672 Pain in left foot: Secondary | ICD-10-CM | POA: Diagnosis not present

## 2017-11-05 DIAGNOSIS — M79671 Pain in right foot: Secondary | ICD-10-CM | POA: Diagnosis not present

## 2017-11-06 DIAGNOSIS — R202 Paresthesia of skin: Secondary | ICD-10-CM | POA: Diagnosis not present

## 2017-11-06 DIAGNOSIS — R252 Cramp and spasm: Secondary | ICD-10-CM | POA: Diagnosis not present

## 2017-11-06 DIAGNOSIS — H9201 Otalgia, right ear: Secondary | ICD-10-CM | POA: Diagnosis not present

## 2017-11-06 DIAGNOSIS — R0981 Nasal congestion: Secondary | ICD-10-CM | POA: Diagnosis not present

## 2017-11-18 ENCOUNTER — Ambulatory Visit: Payer: PPO | Admitting: Podiatry

## 2017-12-03 ENCOUNTER — Other Ambulatory Visit: Payer: Self-pay | Admitting: Family Medicine

## 2017-12-03 DIAGNOSIS — E7800 Pure hypercholesterolemia, unspecified: Secondary | ICD-10-CM

## 2017-12-03 DIAGNOSIS — I10 Essential (primary) hypertension: Secondary | ICD-10-CM

## 2017-12-03 DIAGNOSIS — Z79899 Other long term (current) drug therapy: Secondary | ICD-10-CM

## 2017-12-09 ENCOUNTER — Ambulatory Visit (INDEPENDENT_AMBULATORY_CARE_PROVIDER_SITE_OTHER): Payer: PPO

## 2017-12-09 ENCOUNTER — Other Ambulatory Visit: Payer: PPO

## 2017-12-09 DIAGNOSIS — M810 Age-related osteoporosis without current pathological fracture: Secondary | ICD-10-CM

## 2017-12-09 DIAGNOSIS — Z79899 Other long term (current) drug therapy: Secondary | ICD-10-CM

## 2017-12-09 DIAGNOSIS — I10 Essential (primary) hypertension: Secondary | ICD-10-CM

## 2017-12-09 DIAGNOSIS — E7800 Pure hypercholesterolemia, unspecified: Secondary | ICD-10-CM

## 2017-12-09 LAB — CBC WITH DIFFERENTIAL/PLATELET
BASOS PCT: 0.8 %
Basophils Absolute: 40 cells/uL (ref 0–200)
Eosinophils Absolute: 190 cells/uL (ref 15–500)
Eosinophils Relative: 3.8 %
HEMATOCRIT: 39.9 % (ref 35.0–45.0)
Hemoglobin: 13.5 g/dL (ref 11.7–15.5)
LYMPHS ABS: 1350 {cells}/uL (ref 850–3900)
MCH: 33.7 pg — ABNORMAL HIGH (ref 27.0–33.0)
MCHC: 33.8 g/dL (ref 32.0–36.0)
MCV: 99.5 fL (ref 80.0–100.0)
MPV: 10.4 fL (ref 7.5–12.5)
Monocytes Relative: 9.3 %
NEUTROS PCT: 59.1 %
Neutro Abs: 2955 cells/uL (ref 1500–7800)
Platelets: 177 10*3/uL (ref 140–400)
RBC: 4.01 10*6/uL (ref 3.80–5.10)
RDW: 11.8 % (ref 11.0–15.0)
Total Lymphocyte: 27 %
WBC: 5 10*3/uL (ref 3.8–10.8)
WBCMIX: 465 {cells}/uL (ref 200–950)

## 2017-12-09 LAB — LIPID PANEL
CHOL/HDL RATIO: 2.4 (calc) (ref <5.0)
Cholesterol: 178 mg/dL (ref <200)
HDL: 73 mg/dL (ref >50)
LDL Cholesterol (Calc): 89 mg/dL (calc)
NON-HDL CHOLESTEROL (CALC): 105 mg/dL (ref <130)
Triglycerides: 69 mg/dL (ref <150)

## 2017-12-09 LAB — COMPREHENSIVE METABOLIC PANEL
AG Ratio: 1.7 (calc) (ref 1.0–2.5)
ALBUMIN MSPROF: 3.9 g/dL (ref 3.6–5.1)
ALKALINE PHOSPHATASE (APISO): 44 U/L (ref 33–130)
ALT: 14 U/L (ref 6–29)
AST: 16 U/L (ref 10–35)
BUN: 24 mg/dL (ref 7–25)
CO2: 27 mmol/L (ref 20–32)
CREATININE: 0.73 mg/dL (ref 0.60–0.93)
Calcium: 9.2 mg/dL (ref 8.6–10.4)
Chloride: 105 mmol/L (ref 98–110)
GLUCOSE: 80 mg/dL (ref 65–99)
Globulin: 2.3 g/dL (calc) (ref 1.9–3.7)
Potassium: 4.6 mmol/L (ref 3.5–5.3)
Sodium: 142 mmol/L (ref 135–146)
TOTAL PROTEIN: 6.2 g/dL (ref 6.1–8.1)
Total Bilirubin: 0.5 mg/dL (ref 0.2–1.2)

## 2017-12-09 MED ORDER — DENOSUMAB 60 MG/ML ~~LOC~~ SOSY
60.0000 mg | PREFILLED_SYRINGE | Freq: Once | SUBCUTANEOUS | Status: AC
  Administered 2017-12-09: 60 mg via SUBCUTANEOUS

## 2017-12-09 NOTE — Progress Notes (Signed)
Patient came in today to get Prolia injection. Patient received Prolia in the right arm subq. Patient tolerated well.

## 2017-12-18 ENCOUNTER — Other Ambulatory Visit: Payer: Self-pay | Admitting: Family Medicine

## 2017-12-18 DIAGNOSIS — J018 Other acute sinusitis: Secondary | ICD-10-CM | POA: Diagnosis not present

## 2017-12-18 DIAGNOSIS — R0981 Nasal congestion: Secondary | ICD-10-CM | POA: Diagnosis not present

## 2017-12-18 NOTE — Telephone Encounter (Signed)
Ok to refill??  Last office visit 05/22/2017.  Last refill 10/30/2017.

## 2017-12-31 ENCOUNTER — Telehealth: Payer: Self-pay | Admitting: Family Medicine

## 2017-12-31 NOTE — Telephone Encounter (Signed)
Patient has had a knee replacement, before she can have any dental procedures she needs amoxicillin rx. She has 2 dental procedures scheduled as of now so she is requesting 8 capsules. [facility]acy> [facility] [address] Cb#: [phone]/ [phone]

## 2018-01-01 ENCOUNTER — Other Ambulatory Visit: Payer: Self-pay | Admitting: Family Medicine

## 2018-01-01 MED ORDER — AMOXICILLIN 500 MG PO CAPS: 2000.0000 mg | ORAL_CAPSULE | Freq: Once | ORAL | 0 refills | Status: AC

## 2018-01-01 NOTE — Telephone Encounter (Signed)
I sent to [facility]

## 2018-01-01 NOTE — Telephone Encounter (Signed)
Pt aware of med sent to pharm via vm

## 2018-01-04 ENCOUNTER — Other Ambulatory Visit: Payer: Self-pay | Admitting: Family Medicine

## 2018-01-05 ENCOUNTER — Other Ambulatory Visit: Payer: Self-pay | Admitting: Family Medicine

## 2018-01-05 MED ORDER — HYDROCODONE-ACETAMINOPHEN 5-325 MG PO TABS: 1.0000 | ORAL_TABLET | Freq: Four times a day (QID) | ORAL | 0 refills | Status: DC | PRN

## 2018-01-05 NOTE — Telephone Encounter (Signed)
Patient is requesting a refill on Hydrocodone   LOV: 05/22/17  LRF:   10/30/17

## 2018-01-05 NOTE — Telephone Encounter (Signed)
Refill on hydrocodone to [facility] [address]

## 2018-01-05 NOTE — Telephone Encounter (Signed)
Requesting refill    Lorazepam  LOV: 05/22/17  LRF:  12/18/17

## 2018-01-07 DIAGNOSIS — B351 Tinea unguium: Secondary | ICD-10-CM | POA: Diagnosis not present

## 2018-01-07 DIAGNOSIS — M79671 Pain in right foot: Secondary | ICD-10-CM | POA: Diagnosis not present

## 2018-01-07 DIAGNOSIS — L84 Corns and callosities: Secondary | ICD-10-CM | POA: Diagnosis not present

## 2018-01-07 DIAGNOSIS — M79672 Pain in left foot: Secondary | ICD-10-CM | POA: Diagnosis not present

## 2018-01-19 ENCOUNTER — Other Ambulatory Visit: Payer: Self-pay | Admitting: Family Medicine

## 2018-02-06 DIAGNOSIS — F419 Anxiety disorder, unspecified: Secondary | ICD-10-CM | POA: Diagnosis not present

## 2018-02-06 DIAGNOSIS — F329 Major depressive disorder, single episode, unspecified: Secondary | ICD-10-CM | POA: Diagnosis not present

## 2018-02-07 ENCOUNTER — Other Ambulatory Visit: Payer: Self-pay | Admitting: Family Medicine

## 2018-02-11 ENCOUNTER — Other Ambulatory Visit: Payer: Self-pay | Admitting: Family Medicine

## 2018-02-11 NOTE — Telephone Encounter (Signed)
Ok to refill??  Last office visit 05/22/2017.  Last refill 01/05/2018.

## 2018-02-13 ENCOUNTER — Ambulatory Visit: Payer: PPO | Admitting: Family Medicine

## 2018-02-28 DIAGNOSIS — R05 Cough: Secondary | ICD-10-CM | POA: Diagnosis not present

## 2018-02-28 DIAGNOSIS — F329 Major depressive disorder, single episode, unspecified: Secondary | ICD-10-CM | POA: Diagnosis not present

## 2018-02-28 DIAGNOSIS — I10 Essential (primary) hypertension: Secondary | ICD-10-CM | POA: Diagnosis not present

## 2018-02-28 DIAGNOSIS — J209 Acute bronchitis, unspecified: Secondary | ICD-10-CM | POA: Diagnosis not present

## 2018-02-28 DIAGNOSIS — J019 Acute sinusitis, unspecified: Secondary | ICD-10-CM | POA: Diagnosis not present

## 2018-02-28 DIAGNOSIS — F419 Anxiety disorder, unspecified: Secondary | ICD-10-CM | POA: Diagnosis not present

## 2018-03-20 ENCOUNTER — Other Ambulatory Visit: Payer: Self-pay | Admitting: Family Medicine

## 2018-03-20 NOTE — Telephone Encounter (Signed)
Ok to refill??  Last office visit 05/22/2017.  Last refill 02/12/2018.

## 2018-03-23 DIAGNOSIS — E7800 Pure hypercholesterolemia, unspecified: Secondary | ICD-10-CM | POA: Diagnosis not present

## 2018-03-23 DIAGNOSIS — J40 Bronchitis, not specified as acute or chronic: Secondary | ICD-10-CM | POA: Diagnosis not present

## 2018-03-23 DIAGNOSIS — I10 Essential (primary) hypertension: Secondary | ICD-10-CM | POA: Diagnosis not present

## 2018-03-23 DIAGNOSIS — R5383 Other fatigue: Secondary | ICD-10-CM | POA: Diagnosis not present

## 2018-03-23 DIAGNOSIS — Z79899 Other long term (current) drug therapy: Secondary | ICD-10-CM | POA: Diagnosis not present

## 2018-03-23 DIAGNOSIS — R05 Cough: Secondary | ICD-10-CM | POA: Diagnosis not present

## 2018-03-23 DIAGNOSIS — E349 Endocrine disorder, unspecified: Secondary | ICD-10-CM | POA: Diagnosis not present

## 2018-03-23 DIAGNOSIS — E559 Vitamin D deficiency, unspecified: Secondary | ICD-10-CM | POA: Diagnosis not present

## 2018-03-23 DIAGNOSIS — Z131 Encounter for screening for diabetes mellitus: Secondary | ICD-10-CM | POA: Diagnosis not present

## 2018-04-08 DIAGNOSIS — L84 Corns and callosities: Secondary | ICD-10-CM | POA: Diagnosis not present

## 2018-04-08 DIAGNOSIS — B351 Tinea unguium: Secondary | ICD-10-CM | POA: Diagnosis not present

## 2018-04-08 DIAGNOSIS — M79671 Pain in right foot: Secondary | ICD-10-CM | POA: Diagnosis not present

## 2018-04-08 DIAGNOSIS — M79672 Pain in left foot: Secondary | ICD-10-CM | POA: Diagnosis not present

## 2018-04-16 DIAGNOSIS — R202 Paresthesia of skin: Secondary | ICD-10-CM | POA: Diagnosis not present

## 2018-04-16 DIAGNOSIS — F418 Other specified anxiety disorders: Secondary | ICD-10-CM | POA: Diagnosis not present

## 2018-04-16 DIAGNOSIS — F3289 Other specified depressive episodes: Secondary | ICD-10-CM | POA: Diagnosis not present

## 2018-04-16 DIAGNOSIS — F328 Other depressive episodes: Secondary | ICD-10-CM | POA: Diagnosis not present

## 2018-04-23 ENCOUNTER — Other Ambulatory Visit: Payer: Self-pay | Admitting: Family Medicine

## 2018-04-23 NOTE — Telephone Encounter (Signed)
Ok to refill??  Last office visit 05/22/2017.  Last refill 03/20/2018.

## 2018-04-27 DIAGNOSIS — J40 Bronchitis, not specified as acute or chronic: Secondary | ICD-10-CM | POA: Diagnosis not present

## 2018-04-27 DIAGNOSIS — F329 Major depressive disorder, single episode, unspecified: Secondary | ICD-10-CM | POA: Diagnosis not present

## 2018-04-27 DIAGNOSIS — F419 Anxiety disorder, unspecified: Secondary | ICD-10-CM | POA: Diagnosis not present

## 2018-04-27 DIAGNOSIS — Z79899 Other long term (current) drug therapy: Secondary | ICD-10-CM | POA: Diagnosis not present

## 2018-04-27 DIAGNOSIS — I10 Essential (primary) hypertension: Secondary | ICD-10-CM | POA: Diagnosis not present

## 2018-04-29 ENCOUNTER — Other Ambulatory Visit: Payer: Self-pay | Admitting: Family Medicine

## 2018-04-29 MED ORDER — LOSARTAN POTASSIUM 100 MG PO TABS: 50.0000 mg | ORAL_TABLET | Freq: Every day | ORAL | 1 refills | Status: DC

## 2018-04-30 ENCOUNTER — Other Ambulatory Visit: Payer: Self-pay | Admitting: *Deleted

## 2018-04-30 MED ORDER — LOSARTAN POTASSIUM 100 MG PO TABS: 50.0000 mg | ORAL_TABLET | Freq: Every day | ORAL | 1 refills | Status: DC

## 2018-05-05 ENCOUNTER — Other Ambulatory Visit: Payer: Self-pay | Admitting: Family Medicine

## 2018-05-05 DIAGNOSIS — L988 Other specified disorders of the skin and subcutaneous tissue: Secondary | ICD-10-CM | POA: Diagnosis not present

## 2018-05-05 DIAGNOSIS — I10 Essential (primary) hypertension: Secondary | ICD-10-CM | POA: Diagnosis not present

## 2018-05-05 MED ORDER — HYDROCODONE-ACETAMINOPHEN 5-325 MG PO TABS: 1.0000 | ORAL_TABLET | Freq: Four times a day (QID) | ORAL | 0 refills | Status: DC | PRN

## 2018-05-05 NOTE — Telephone Encounter (Signed)
Patient is requesting a refill on Hydrocodone   LOV: 05/22/17 LRF:   01/05/18

## 2018-05-05 NOTE — Telephone Encounter (Signed)
Refill on hydrocodone to [facility] [address]

## 2018-05-09 ENCOUNTER — Other Ambulatory Visit: Payer: Self-pay | Admitting: Family Medicine

## 2018-05-16 ENCOUNTER — Other Ambulatory Visit: Payer: Self-pay | Admitting: Family Medicine

## 2018-06-02 DIAGNOSIS — R0602 Shortness of breath: Secondary | ICD-10-CM | POA: Diagnosis not present

## 2018-06-02 DIAGNOSIS — R062 Wheezing: Secondary | ICD-10-CM | POA: Diagnosis not present

## 2018-06-02 DIAGNOSIS — J069 Acute upper respiratory infection, unspecified: Secondary | ICD-10-CM | POA: Diagnosis not present

## 2018-06-02 DIAGNOSIS — J209 Acute bronchitis, unspecified: Secondary | ICD-10-CM | POA: Diagnosis not present

## 2018-06-10 ENCOUNTER — Ambulatory Visit (INDEPENDENT_AMBULATORY_CARE_PROVIDER_SITE_OTHER): Payer: PPO | Admitting: Family Medicine

## 2018-06-10 ENCOUNTER — Other Ambulatory Visit: Payer: Self-pay

## 2018-06-10 DIAGNOSIS — M810 Age-related osteoporosis without current pathological fracture: Secondary | ICD-10-CM

## 2018-06-10 MED ORDER — DENOSUMAB 60 MG/ML ~~LOC~~ SOSY
60.0000 mg | PREFILLED_SYRINGE | SUBCUTANEOUS | Status: DC
  Administered 2018-06-10 – 2018-12-29 (×2): 60 mg via SUBCUTANEOUS

## 2018-07-27 ENCOUNTER — Other Ambulatory Visit: Payer: Self-pay | Admitting: Family Medicine

## 2018-07-27 DIAGNOSIS — F329 Major depressive disorder, single episode, unspecified: Secondary | ICD-10-CM | POA: Diagnosis not present

## 2018-07-27 DIAGNOSIS — F419 Anxiety disorder, unspecified: Secondary | ICD-10-CM | POA: Diagnosis not present

## 2018-07-27 DIAGNOSIS — R062 Wheezing: Secondary | ICD-10-CM | POA: Diagnosis not present

## 2018-07-27 DIAGNOSIS — R5383 Other fatigue: Secondary | ICD-10-CM | POA: Diagnosis not present

## 2018-07-27 DIAGNOSIS — J309 Allergic rhinitis, unspecified: Secondary | ICD-10-CM | POA: Diagnosis not present

## 2018-07-27 DIAGNOSIS — Z79899 Other long term (current) drug therapy: Secondary | ICD-10-CM | POA: Diagnosis not present

## 2018-07-27 DIAGNOSIS — I10 Essential (primary) hypertension: Secondary | ICD-10-CM | POA: Diagnosis not present

## 2018-07-27 DIAGNOSIS — E559 Vitamin D deficiency, unspecified: Secondary | ICD-10-CM | POA: Diagnosis not present

## 2018-07-27 MED ORDER — HYDROCODONE-ACETAMINOPHEN 5-325 MG PO TABS: 1.0000 | ORAL_TABLET | Freq: Four times a day (QID) | ORAL | 0 refills | Status: DC | PRN

## 2018-07-27 NOTE — Telephone Encounter (Signed)
Patient needs refill on hydrocodone  [facility] [address]

## 2018-07-27 NOTE — Telephone Encounter (Signed)
Patient is requesting a refill on Hydrocodone   LOV: 05/22/17  LRF:  05/05/18

## 2018-07-28 DIAGNOSIS — M79672 Pain in left foot: Secondary | ICD-10-CM | POA: Diagnosis not present

## 2018-07-28 DIAGNOSIS — L84 Corns and callosities: Secondary | ICD-10-CM | POA: Diagnosis not present

## 2018-07-28 DIAGNOSIS — B351 Tinea unguium: Secondary | ICD-10-CM | POA: Diagnosis not present

## 2018-07-28 DIAGNOSIS — M79671 Pain in right foot: Secondary | ICD-10-CM | POA: Diagnosis not present

## 2018-08-07 DIAGNOSIS — B9689 Other specified bacterial agents as the cause of diseases classified elsewhere: Secondary | ICD-10-CM | POA: Diagnosis not present

## 2018-08-07 DIAGNOSIS — H6592 Unspecified nonsuppurative otitis media, left ear: Secondary | ICD-10-CM | POA: Diagnosis not present

## 2018-08-07 DIAGNOSIS — J329 Chronic sinusitis, unspecified: Secondary | ICD-10-CM | POA: Diagnosis not present

## 2018-08-07 DIAGNOSIS — R590 Localized enlarged lymph nodes: Secondary | ICD-10-CM | POA: Diagnosis not present

## 2018-08-26 DIAGNOSIS — Z7251 High risk heterosexual behavior: Secondary | ICD-10-CM | POA: Diagnosis not present

## 2018-08-26 DIAGNOSIS — E559 Vitamin D deficiency, unspecified: Secondary | ICD-10-CM | POA: Diagnosis not present

## 2018-08-26 DIAGNOSIS — Z131 Encounter for screening for diabetes mellitus: Secondary | ICD-10-CM | POA: Diagnosis not present

## 2018-08-26 DIAGNOSIS — R5383 Other fatigue: Secondary | ICD-10-CM | POA: Diagnosis not present

## 2018-08-26 DIAGNOSIS — Z1331 Encounter for screening for depression: Secondary | ICD-10-CM | POA: Diagnosis not present

## 2018-08-26 DIAGNOSIS — R8290 Unspecified abnormal findings in urine: Secondary | ICD-10-CM | POA: Diagnosis not present

## 2018-08-26 DIAGNOSIS — E7800 Pure hypercholesterolemia, unspecified: Secondary | ICD-10-CM | POA: Diagnosis not present

## 2018-08-26 DIAGNOSIS — Z1159 Encounter for screening for other viral diseases: Secondary | ICD-10-CM | POA: Diagnosis not present

## 2018-08-26 DIAGNOSIS — Z0000 Encounter for general adult medical examination without abnormal findings: Secondary | ICD-10-CM | POA: Diagnosis not present

## 2018-08-26 DIAGNOSIS — N951 Menopausal and female climacteric states: Secondary | ICD-10-CM | POA: Diagnosis not present

## 2018-08-26 DIAGNOSIS — Z1339 Encounter for screening examination for other mental health and behavioral disorders: Secondary | ICD-10-CM | POA: Diagnosis not present

## 2018-08-26 DIAGNOSIS — R350 Frequency of micturition: Secondary | ICD-10-CM | POA: Diagnosis not present

## 2018-08-26 DIAGNOSIS — R0602 Shortness of breath: Secondary | ICD-10-CM | POA: Diagnosis not present

## 2018-09-16 DIAGNOSIS — R0602 Shortness of breath: Secondary | ICD-10-CM | POA: Diagnosis not present

## 2018-09-16 DIAGNOSIS — I10 Essential (primary) hypertension: Secondary | ICD-10-CM | POA: Diagnosis not present

## 2018-09-19 DIAGNOSIS — Z23 Encounter for immunization: Secondary | ICD-10-CM | POA: Diagnosis not present

## 2018-09-19 DIAGNOSIS — L03115 Cellulitis of right lower limb: Secondary | ICD-10-CM | POA: Diagnosis not present

## 2018-09-29 DIAGNOSIS — M542 Cervicalgia: Secondary | ICD-10-CM | POA: Diagnosis not present

## 2018-09-29 DIAGNOSIS — L03115 Cellulitis of right lower limb: Secondary | ICD-10-CM | POA: Diagnosis not present

## 2018-09-29 DIAGNOSIS — H6690 Otitis media, unspecified, unspecified ear: Secondary | ICD-10-CM | POA: Diagnosis not present

## 2018-10-01 DIAGNOSIS — Z90710 Acquired absence of both cervix and uterus: Secondary | ICD-10-CM | POA: Diagnosis not present

## 2018-10-01 DIAGNOSIS — Z8262 Family history of osteoporosis: Secondary | ICD-10-CM | POA: Diagnosis not present

## 2018-10-01 DIAGNOSIS — M8589 Other specified disorders of bone density and structure, multiple sites: Secondary | ICD-10-CM | POA: Diagnosis not present

## 2018-10-01 DIAGNOSIS — Z96651 Presence of right artificial knee joint: Secondary | ICD-10-CM | POA: Diagnosis not present

## 2018-10-01 DIAGNOSIS — Z803 Family history of malignant neoplasm of breast: Secondary | ICD-10-CM | POA: Diagnosis not present

## 2018-10-01 DIAGNOSIS — Z1231 Encounter for screening mammogram for malignant neoplasm of breast: Secondary | ICD-10-CM | POA: Diagnosis not present

## 2018-10-01 DIAGNOSIS — R29890 Loss of height: Secondary | ICD-10-CM | POA: Diagnosis not present

## 2018-10-01 LAB — HM MAMMOGRAPHY

## 2018-10-01 LAB — HM DEXA SCAN

## 2018-10-06 ENCOUNTER — Other Ambulatory Visit: Payer: Self-pay | Admitting: Family Medicine

## 2018-10-06 NOTE — Telephone Encounter (Signed)
Patient is requesting a refill on Hydrocodone   LOV: 05/22/17  LRF:  07/27/18

## 2018-10-06 NOTE — Telephone Encounter (Signed)
Pt needs refill on hydrodocodone to [facility]

## 2018-10-07 ENCOUNTER — Encounter: Payer: Self-pay | Admitting: *Deleted

## 2018-10-08 MED ORDER — HYDROCODONE-ACETAMINOPHEN 5-325 MG PO TABS: 1.0000 | ORAL_TABLET | Freq: Four times a day (QID) | ORAL | 0 refills | Status: DC | PRN

## 2018-10-12 DIAGNOSIS — H40013 Open angle with borderline findings, low risk, bilateral: Secondary | ICD-10-CM | POA: Diagnosis not present

## 2018-10-12 DIAGNOSIS — H35363 Drusen (degenerative) of macula, bilateral: Secondary | ICD-10-CM | POA: Diagnosis not present

## 2018-10-12 DIAGNOSIS — H43393 Other vitreous opacities, bilateral: Secondary | ICD-10-CM | POA: Diagnosis not present

## 2018-10-12 DIAGNOSIS — H26493 Other secondary cataract, bilateral: Secondary | ICD-10-CM | POA: Diagnosis not present

## 2018-10-13 ENCOUNTER — Other Ambulatory Visit: Payer: Self-pay | Admitting: *Deleted

## 2018-10-13 DIAGNOSIS — M79671 Pain in right foot: Secondary | ICD-10-CM | POA: Diagnosis not present

## 2018-10-13 DIAGNOSIS — B351 Tinea unguium: Secondary | ICD-10-CM | POA: Diagnosis not present

## 2018-10-13 DIAGNOSIS — L84 Corns and callosities: Secondary | ICD-10-CM | POA: Diagnosis not present

## 2018-10-13 DIAGNOSIS — M79672 Pain in left foot: Secondary | ICD-10-CM | POA: Diagnosis not present

## 2018-10-13 MED ORDER — LOSARTAN POTASSIUM 50 MG PO TABS: ORAL_TABLET | ORAL | 3 refills | Status: DC

## 2018-10-21 ENCOUNTER — Telehealth: Payer: Self-pay | Admitting: Family Medicine

## 2018-10-21 NOTE — Telephone Encounter (Signed)
Pt received letter about her bone density and would like to know if it has gotten worse or better since the last one and does she need to continue the Prolia??

## 2018-10-22 NOTE — Telephone Encounter (Signed)
It is stable, no significant change.  I would continue prolia.

## 2018-10-23 NOTE — Telephone Encounter (Signed)
Pt aware via vm 

## 2018-10-29 ENCOUNTER — Other Ambulatory Visit: Payer: Self-pay

## 2018-11-13 DIAGNOSIS — R942 Abnormal results of pulmonary function studies: Secondary | ICD-10-CM | POA: Diagnosis not present

## 2018-11-13 DIAGNOSIS — R0602 Shortness of breath: Secondary | ICD-10-CM | POA: Diagnosis not present

## 2018-11-17 ENCOUNTER — Other Ambulatory Visit: Payer: Self-pay | Admitting: Family Medicine

## 2018-11-23 DIAGNOSIS — H5052 Exophoria: Secondary | ICD-10-CM | POA: Diagnosis not present

## 2018-11-23 DIAGNOSIS — H532 Diplopia: Secondary | ICD-10-CM | POA: Diagnosis not present

## 2018-11-30 DIAGNOSIS — M5030 Other cervical disc degeneration, unspecified cervical region: Secondary | ICD-10-CM | POA: Diagnosis not present

## 2018-11-30 DIAGNOSIS — F419 Anxiety disorder, unspecified: Secondary | ICD-10-CM | POA: Diagnosis not present

## 2018-11-30 DIAGNOSIS — Z131 Encounter for screening for diabetes mellitus: Secondary | ICD-10-CM | POA: Diagnosis not present

## 2018-11-30 DIAGNOSIS — F329 Major depressive disorder, single episode, unspecified: Secondary | ICD-10-CM | POA: Diagnosis not present

## 2018-11-30 DIAGNOSIS — I10 Essential (primary) hypertension: Secondary | ICD-10-CM | POA: Diagnosis not present

## 2018-11-30 DIAGNOSIS — Z79899 Other long term (current) drug therapy: Secondary | ICD-10-CM | POA: Diagnosis not present

## 2018-11-30 DIAGNOSIS — G4700 Insomnia, unspecified: Secondary | ICD-10-CM | POA: Diagnosis not present

## 2018-11-30 DIAGNOSIS — E7800 Pure hypercholesterolemia, unspecified: Secondary | ICD-10-CM | POA: Diagnosis not present

## 2018-11-30 DIAGNOSIS — Z1159 Encounter for screening for other viral diseases: Secondary | ICD-10-CM | POA: Diagnosis not present

## 2018-11-30 DIAGNOSIS — R5383 Other fatigue: Secondary | ICD-10-CM | POA: Diagnosis not present

## 2018-11-30 DIAGNOSIS — E559 Vitamin D deficiency, unspecified: Secondary | ICD-10-CM | POA: Diagnosis not present

## 2018-11-30 DIAGNOSIS — M129 Arthropathy, unspecified: Secondary | ICD-10-CM | POA: Diagnosis not present

## 2018-12-14 ENCOUNTER — Ambulatory Visit: Payer: PPO

## 2018-12-29 ENCOUNTER — Other Ambulatory Visit: Payer: Self-pay

## 2018-12-29 ENCOUNTER — Ambulatory Visit (INDEPENDENT_AMBULATORY_CARE_PROVIDER_SITE_OTHER): Payer: PPO

## 2018-12-29 ENCOUNTER — Other Ambulatory Visit: Payer: Self-pay | Admitting: Family Medicine

## 2018-12-29 DIAGNOSIS — M810 Age-related osteoporosis without current pathological fracture: Secondary | ICD-10-CM

## 2018-12-29 DIAGNOSIS — Z23 Encounter for immunization: Secondary | ICD-10-CM | POA: Diagnosis not present

## 2018-12-29 MED ORDER — HYDROCODONE-ACETAMINOPHEN 5-325 MG PO TABS: 1.0000 | ORAL_TABLET | Freq: Four times a day (QID) | ORAL | 0 refills | Status: DC | PRN

## 2018-12-29 NOTE — Telephone Encounter (Signed)
Patient called in requesting a refill on Norco and she also wanted to know if she can get something for constipation that she believes is caused by this medication. Please advise?  Last refilled:10/08/2018 Last office visit:05/20/2017

## 2019-01-11 DIAGNOSIS — I10 Essential (primary) hypertension: Secondary | ICD-10-CM | POA: Diagnosis not present

## 2019-01-11 DIAGNOSIS — Z9189 Other specified personal risk factors, not elsewhere classified: Secondary | ICD-10-CM | POA: Diagnosis not present

## 2019-01-11 DIAGNOSIS — M5030 Other cervical disc degeneration, unspecified cervical region: Secondary | ICD-10-CM | POA: Diagnosis not present

## 2019-01-11 DIAGNOSIS — E7800 Pure hypercholesterolemia, unspecified: Secondary | ICD-10-CM | POA: Diagnosis not present

## 2019-01-20 ENCOUNTER — Other Ambulatory Visit: Payer: Self-pay | Admitting: Family Medicine

## 2019-02-12 DIAGNOSIS — M79671 Pain in right foot: Secondary | ICD-10-CM | POA: Diagnosis not present

## 2019-02-12 DIAGNOSIS — B351 Tinea unguium: Secondary | ICD-10-CM | POA: Diagnosis not present

## 2019-02-12 DIAGNOSIS — M79672 Pain in left foot: Secondary | ICD-10-CM | POA: Diagnosis not present

## 2019-02-12 DIAGNOSIS — L84 Corns and callosities: Secondary | ICD-10-CM | POA: Diagnosis not present

## 2019-02-27 ENCOUNTER — Other Ambulatory Visit: Payer: Self-pay | Admitting: Family Medicine

## 2019-03-08 ENCOUNTER — Other Ambulatory Visit: Payer: Self-pay

## 2019-03-08 ENCOUNTER — Ambulatory Visit (INDEPENDENT_AMBULATORY_CARE_PROVIDER_SITE_OTHER): Payer: PPO | Admitting: Family Medicine

## 2019-03-08 ENCOUNTER — Encounter: Payer: Self-pay | Admitting: Family Medicine

## 2019-03-08 VITALS — BP 134/78 | HR 83 | Temp 98.4°F | Resp 16 | Ht 60.0 in | Wt 214.0 lb

## 2019-03-08 DIAGNOSIS — M25562 Pain in left knee: Secondary | ICD-10-CM | POA: Diagnosis not present

## 2019-03-08 DIAGNOSIS — G8929 Other chronic pain: Secondary | ICD-10-CM

## 2019-03-08 DIAGNOSIS — M25512 Pain in left shoulder: Secondary | ICD-10-CM

## 2019-03-08 NOTE — Progress Notes (Signed)
Subjective:    Patient ID: Christina Lyons, female    DOB: [birth year], 78 y.o.   MRN: [id number]  HPI Patient presents today complaining of pain in her left knee.  The pain is worse whenever she is walking or trying to go down steps.  She reports sharp pain over her medial and lateral joint line anteriorly.  She also has a mild joint effusion.  She denies any laxity to varus or valgus stress.  At times however she feels like her knee is going to buckle when she fully extends her leg.  She denies any falls or injuries.  Over the last month she also has developed pain in her left shoulder.  It hurts to abduct her arm greater than 60 degrees.  It hurts with internal rotation.  She has a positive empty can sign with diminished strength in her shoulder abductor.  She also has pain with Hawkins maneuver.  She reports an aching pain in her shoulder that keeps her awake at night. Past Medical History:  Diagnosis Date  . DDD (degenerative disc disease), lumbar   . GERD (gastroesophageal reflux disease)   . Hyperlipidemia   . Hypertension   . Osteomyelitis ([place]) 1946   both legs  . Osteoporosis   . Other idiopathic scoliosis, thoracolumbar region    Past Surgical History:  Procedure Laterality Date  . ABDOMINAL HYSTERECTOMY    . CHOLECYSTECTOMY    . JOINT REPLACEMENT     right knee   Current Outpatient Medications on File Prior to Visit  Medication Sig Dispense Refill  . albuterol (PROVENTIL HFA;VENTOLIN HFA) 108 (90 Base) MCG/ACT inhaler Inhale 2 puffs into the lungs every 6 (six) hours as needed for wheezing or shortness of breath. 1 Inhaler 2  . aspirin EC 81 MG tablet Take 81 mg by mouth daily.    . cholecalciferol (VITAMIN D) 1000 units tablet Take 1,000 Units by mouth 2 (two) times daily.    . hydrochlorothiazide (HYDRODIURIL) 25 MG tablet Take 1 tablet (25 mg total) by mouth daily. 90 tablet 3  . HYDROcodone-acetaminophen (NORCO/VICODIN) 5-325 MG tablet Take 1 tablet by mouth every 6  (six) hours as needed. Chronic Pain. Dx: G89.4 60 tablet 0  . LORazepam (ATIVAN) 1 MG tablet TAKE 1 TABLET BY MOUTH TWICE A DAY AS NEEDED FOR ANXIETY 60 tablet 0  . losartan (COZAAR) 50 MG tablet TAKE 1 TABLET BY MOUTH EVERY DAY. 90 tablet 3  . Multiple Vitamin (MULTIVITAMIN) capsule Take 1 capsule by mouth daily.    . Omega-3 Fatty Acids (FISH OIL) 1000 MG CPDR Take 1 capsule by mouth 2 (two) times daily.    Marland Kitchen omeprazole (PRILOSEC) 20 MG capsule TAKE 1 CAPSULE BY MOUTH TWICE A DAY 180 capsule 3  . promethazine (PHENERGAN) 25 MG tablet TAKE 1 TABLET BY MOUTH EVERY 8 HOURS AS NEEDED FOR NAUSEA AND VOMITING 20 tablet 2  . rosuvastatin (CRESTOR) 5 MG tablet TAKE 1 TABLET (5 MG TOTAL) BY MOUTH DAILY AT 6 PM. 90 tablet 3  . Turmeric 500 MG CAPS Take 1 Can by mouth 2 (two) times daily.     Current Facility-Administered Medications on File Prior to Visit  Medication Dose Route Frequency Provider Last Rate Last Dose  . denosumab (PROLIA) injection 60 mg  60 mg Subcutaneous Q6 months [name], MD   60 mg at 12/29/18 1442   Allergies  Allergen Reactions  . Sulfa Antibiotics Hives   Social History   Socioeconomic History  .  Marital status: Widowed    Spouse name: Not on file  . Number of children: Not on file  . Years of education: Not on file  . Highest education level: Not on file  Occupational History  . Occupation: Retired  Scientific laboratory technician  . Financial resource strain: Not on file  . Food insecurity    Worry: Not on file    Inability: Not on file  . Transportation needs    Medical: Not on file    Non-medical: Not on file  Tobacco Use  . Smoking status: Former Smoker    Packs/day: 0.50    Years: 5.00    Pack years: 2.50    Types: Cigarettes    Quit date: 04/01/1980    Years since quitting: 38.9  . Smokeless tobacco: Never Used  Substance and Sexual Activity  . Alcohol use: No    Alcohol/week: 0.0 standard drinks  . Drug use: No  . Sexual activity: Not on file  Lifestyle   . Physical activity    Days per week: Not on file    Minutes per session: Not on file  . Stress: Not on file  Relationships  . Social Herbalist on phone: Not on file    Gets together: Not on file    Attends religious service: Not on file    Active member of club or organization: Not on file    Attends meetings of clubs or organizations: Not on file    Relationship status: Not on file  . Intimate partner violence    Fear of current or ex partner: Not on file    Emotionally abused: Not on file    Physically abused: Not on file    Forced sexual activity: Not on file  Other Topics Concern  . Not on file  Social History Narrative  . Not on file      Review of Systems  All other systems reviewed and are negative.      Objective:   Physical Exam Vitals signs reviewed.  Constitutional:      Appearance: Normal appearance. She is normal weight.  Cardiovascular:     Rate and Rhythm: Normal rate and regular rhythm.  Pulmonary:     Effort: Pulmonary effort is normal.     Breath sounds: Normal breath sounds.  Musculoskeletal:     Left shoulder: She exhibits decreased range of motion, tenderness, pain and decreased strength.     Left knee: She exhibits decreased range of motion and swelling. She exhibits no LCL laxity, normal meniscus and no MCL laxity. Tenderness found. Medial joint line and lateral joint line tenderness noted.  Neurological:     Mental Status: She is alert.           Assessment & Plan:  Chronic pain of left knee  Acute pain of left shoulder  Pain in the patient's left knee is most likely due to osteoarthritis.  Using sterile technique, I injected the left knee with 2 cc lidocaine, 2 cc of Marcaine, and 2 cc of 40 mg/mL Kenalog.  The patient tolerated the procedure well without complication.  The pain in her left shoulder sounds like subacromial bursitis with rotator cuff tendinitis although I cannot exclude a partial tear in the rotator cuff.   Using sterile technique, I injected the left subacromial space with 2 cc of Marcaine, 2 cc lidocaine, and 2 cc of 40 mg/mL Kenalog.  Patient tolerated the procedure well without complication.

## 2019-03-15 DIAGNOSIS — F419 Anxiety disorder, unspecified: Secondary | ICD-10-CM | POA: Diagnosis not present

## 2019-03-15 DIAGNOSIS — G629 Polyneuropathy, unspecified: Secondary | ICD-10-CM | POA: Diagnosis not present

## 2019-03-15 DIAGNOSIS — J209 Acute bronchitis, unspecified: Secondary | ICD-10-CM | POA: Diagnosis not present

## 2019-03-15 DIAGNOSIS — Z79899 Other long term (current) drug therapy: Secondary | ICD-10-CM | POA: Diagnosis not present

## 2019-03-15 DIAGNOSIS — F329 Major depressive disorder, single episode, unspecified: Secondary | ICD-10-CM | POA: Diagnosis not present

## 2019-03-15 DIAGNOSIS — G4700 Insomnia, unspecified: Secondary | ICD-10-CM | POA: Diagnosis not present

## 2019-03-22 DIAGNOSIS — E7800 Pure hypercholesterolemia, unspecified: Secondary | ICD-10-CM | POA: Diagnosis not present

## 2019-03-22 DIAGNOSIS — I10 Essential (primary) hypertension: Secondary | ICD-10-CM | POA: Diagnosis not present

## 2019-03-22 DIAGNOSIS — R5383 Other fatigue: Secondary | ICD-10-CM | POA: Diagnosis not present

## 2019-03-22 DIAGNOSIS — Z1159 Encounter for screening for other viral diseases: Secondary | ICD-10-CM | POA: Diagnosis not present

## 2019-03-22 DIAGNOSIS — G629 Polyneuropathy, unspecified: Secondary | ICD-10-CM | POA: Diagnosis not present

## 2019-03-22 DIAGNOSIS — D539 Nutritional anemia, unspecified: Secondary | ICD-10-CM | POA: Diagnosis not present

## 2019-03-22 DIAGNOSIS — E559 Vitamin D deficiency, unspecified: Secondary | ICD-10-CM | POA: Diagnosis not present

## 2019-03-22 DIAGNOSIS — Z79899 Other long term (current) drug therapy: Secondary | ICD-10-CM | POA: Diagnosis not present

## 2019-03-22 DIAGNOSIS — M129 Arthropathy, unspecified: Secondary | ICD-10-CM | POA: Diagnosis not present

## 2019-03-22 DIAGNOSIS — Z0000 Encounter for general adult medical examination without abnormal findings: Secondary | ICD-10-CM | POA: Diagnosis not present

## 2019-04-06 ENCOUNTER — Telehealth: Payer: Self-pay | Admitting: Family Medicine

## 2019-04-06 NOTE — Telephone Encounter (Signed)
Patient called office asking if we had sent in her reimbursement request form. I have filled out the paperwork however before we can send this form she needs to bring in her EOB. I have notified patient that she needs to bring her EOB from her insurance EOB. I have placed the sheets back on the prolia book and once patient has brought in EOB I will send to them.  CB# [phone]

## 2019-04-11 ENCOUNTER — Emergency Department (HOSPITAL_COMMUNITY): Payer: PPO

## 2019-04-11 ENCOUNTER — Other Ambulatory Visit: Payer: Self-pay

## 2019-04-11 ENCOUNTER — Emergency Department (HOSPITAL_COMMUNITY)
Admission: EM | Admit: 2019-04-11 | Discharge: 2019-04-11 | Disposition: A | Discharge status: Home / Self Care | Payer: PPO | Attending: Emergency Medicine | Admitting: Emergency Medicine

## 2019-04-11 DIAGNOSIS — Z7982 Long term (current) use of aspirin: Secondary | ICD-10-CM | POA: Diagnosis not present

## 2019-04-11 DIAGNOSIS — R0902 Hypoxemia: Secondary | ICD-10-CM | POA: Diagnosis not present

## 2019-04-11 DIAGNOSIS — I10 Essential (primary) hypertension: Secondary | ICD-10-CM | POA: Insufficient documentation

## 2019-04-11 DIAGNOSIS — Z96651 Presence of right artificial knee joint: Secondary | ICD-10-CM | POA: Insufficient documentation

## 2019-04-11 DIAGNOSIS — R000 Tachycardia, unspecified: Secondary | ICD-10-CM | POA: Diagnosis not present

## 2019-04-11 DIAGNOSIS — R42 Dizziness and giddiness: Secondary | ICD-10-CM | POA: Insufficient documentation

## 2019-04-11 DIAGNOSIS — Z79899 Other long term (current) drug therapy: Secondary | ICD-10-CM | POA: Diagnosis not present

## 2019-04-11 DIAGNOSIS — R110 Nausea: Secondary | ICD-10-CM | POA: Diagnosis not present

## 2019-04-11 DIAGNOSIS — Z87891 Personal history of nicotine dependence: Secondary | ICD-10-CM | POA: Diagnosis not present

## 2019-04-11 LAB — CBC
HCT: 45.2 % (ref 36.0–46.0)
Hemoglobin: 14.3 g/dL (ref 12.0–15.0)
MCH: 33.3 pg (ref 26.0–34.0)
MCHC: 31.6 g/dL (ref 30.0–36.0)
MCV: 105.1 fL — ABNORMAL HIGH (ref 80.0–100.0)
Platelets: 131 10*3/uL — ABNORMAL LOW (ref 150–400)
RBC: 4.3 MIL/uL (ref 3.87–5.11)
RDW: 13.8 % (ref 11.5–15.5)
WBC: 3.5 10*3/uL — ABNORMAL LOW (ref 4.0–10.5)
nRBC: 0 % (ref 0.0–0.2)

## 2019-04-11 LAB — BASIC METABOLIC PANEL
Anion gap: 7 (ref 5–15)
BUN: 12 mg/dL (ref 8–23)
CO2: 27 mmol/L (ref 22–32)
Calcium: 7.8 mg/dL — ABNORMAL LOW (ref 8.9–10.3)
Chloride: 104 mmol/L (ref 98–111)
Creatinine, Ser: 0.64 mg/dL (ref 0.44–1.00)
GFR calc Af Amer: >60 mL/min (ref >60)
GFR calc non Af Amer: >60 mL/min (ref >60)
Glucose, Bld: 97 mg/dL (ref 70–99)
Potassium: 4 mmol/L (ref 3.5–5.1)
Sodium: 138 mmol/L (ref 135–145)

## 2019-04-11 LAB — CBG MONITORING, ED: Glucose-Capillary: 78 mg/dL (ref 70–99)

## 2019-04-11 MED ORDER — MECLIZINE HCL 25 MG PO TABS
25.0000 mg | ORAL_TABLET | Freq: Once | ORAL | Status: AC
  Administered 2019-04-11: 14:00:00 25 mg via ORAL
  Filled 2019-04-11: qty 1

## 2019-04-11 MED ORDER — MECLIZINE HCL 25 MG PO TABS
12.5000 mg | ORAL_TABLET | Freq: Once | ORAL | Status: AC
  Administered 2019-04-11: 12.5 mg via ORAL
  Filled 2019-04-11: qty 1

## 2019-04-11 MED ORDER — SODIUM CHLORIDE 0.9% FLUSH: 3.0000 mL | Freq: Once | INTRAVENOUS | Status: DC

## 2019-04-11 MED ORDER — ONDANSETRON HCL 4 MG/2ML IJ SOLN
4.0000 mg | Freq: Once | INTRAMUSCULAR | Status: AC
  Administered 2019-04-11: 4 mg via INTRAVENOUS
  Filled 2019-04-11: qty 2

## 2019-04-11 MED ORDER — MECLIZINE HCL 25 MG PO TABS: 25.0000 mg | ORAL_TABLET | Freq: Three times a day (TID) | ORAL | 0 refills | Status: DC | PRN

## 2019-04-11 MED ORDER — ONDANSETRON 4 MG PO TBDP: 4.0000 mg | ORAL_TABLET | Freq: Three times a day (TID) | ORAL | 0 refills | Status: DC | PRN

## 2019-04-11 MED ORDER — MECLIZINE HCL 12.5 MG PO TABS: 12.5000 mg | ORAL_TABLET | Freq: Three times a day (TID) | ORAL | 0 refills | Status: DC | PRN

## 2019-04-11 NOTE — ED Triage Notes (Signed)
To triage via EMS.  Pt states around 7am she bent over and when she lifted her head up started getting dizzy, room dizzy.   Nausea.  EMS BP 156/98 HR 104 temp 98.9 CBG 113 EMS gave Zofran 4mg .   No covid exposure.

## 2019-04-11 NOTE — ED Provider Notes (Signed)
Pt signed out by Dr. Roslynn Amble pending MRI.    IMPRESSION:  No evidence of acute infarction, hemorrhage, or mass. Stable chronic  microvascular ischemic changes.   Pt is still a little dizzy, but has improved.  She is stable for d/c.  Return if worse.   Isla Pence, MD 04/11/19 1625

## 2019-04-11 NOTE — Discharge Instructions (Signed)
Recommend recheck with your primary doctor this coming week.  Recommend taking meclizine as needed for vertigo.  Return to ER if you develop numbness, weakness, vision changes, chest pain or difficulty breathing.

## 2019-04-11 NOTE — ED Provider Notes (Signed)
[facility] Provider Note   CSN: [id number] Arrival date & time: 04/11/19  1233     History Chief Complaint  Patient presents with  . Dizziness    Christina Lyons is a 79 y.o. female.  Presents to ER with new onset dizziness.  Patient states he had an episode couple days ago, however seem to get better.  Then today had another episode, noted this morning, seem to happen after standing up suddenly.  Dizziness worse with sudden movements, improved with rest.  No associated chest pain, difficulty breathing, no ear pain, recent ear infection, no new rashes, no fevers.  No associated numbness, weakness, vision changes.  Denies prior history of vertigo.  Describes dizziness as room spinning.  No syncope.  No known Covid exposure.  Past medical history hyperlipidemia, hypertension.  HPI     Past Medical History:  Diagnosis Date  . DDD (degenerative disc disease), lumbar   . GERD (gastroesophageal reflux disease)   . Hyperlipidemia   . Hypertension   . Osteomyelitis ([place]) 1946   both legs  . Osteoporosis   . Other idiopathic scoliosis, thoracolumbar region     Patient Active Problem List   Diagnosis Date Noted  . Cough 10/06/2015  . S/P total knee arthroplasty 01/07/2013  . Lymphadenopathy 12/14/2012  . Sinusitis, acute 12/14/2012  . Hyperlipidemia   . Hypertension   . Other idiopathic scoliosis, thoracolumbar region   . Osteoporosis   . DDD (degenerative disc disease), lumbar     Past Surgical History:  Procedure Laterality Date  . ABDOMINAL HYSTERECTOMY    . CHOLECYSTECTOMY    . JOINT REPLACEMENT     right knee     OB History   No obstetric history on file.     Family History  Problem Relation Age of Onset  . Asthma Mother   . Asthma Sister   . Heart failure Father   . Breast cancer Sister   . Breast cancer Maternal Aunt     Social History   Tobacco Use  . Smoking status: Former Smoker    Packs/day: 0.50   Years: 5.00    Pack years: 2.50    Types: Cigarettes    Quit date: 04/01/1980    Years since quitting: 39.0  . Smokeless tobacco: Never Used  Substance Use Topics  . Alcohol use: No    Alcohol/week: 0.0 standard drinks  . Drug use: No    Home Medications Prior to Admission medications   Medication Sig Start Date End Date Taking? Authorizing Provider  albuterol (PROVENTIL HFA;VENTOLIN HFA) 108 (90 Base) MCG/ACT inhaler Inhale 2 puffs into the lungs every 6 (six) hours as needed for wheezing or shortness of breath. 04/12/16   Alycia Rossetti, MD  aspirin EC 81 MG tablet Take 81 mg by mouth daily.    [provider]  cholecalciferol (VITAMIN D) 1000 units tablet Take 1,000 Units by mouth 2 (two) times daily.    [provider]  hydrochlorothiazide (HYDRODIURIL) 25 MG tablet Take 1 tablet (25 mg total) by mouth daily. 05/22/17   [name], MD  HYDROcodone-acetaminophen (NORCO/VICODIN) 5-325 MG tablet Take 1 tablet by mouth every 6 (six) hours as needed. Chronic Pain. Dx: G89.4 12/29/18   [name], MD  LORazepam (ATIVAN) 1 MG tablet TAKE 1 TABLET BY MOUTH TWICE A DAY AS NEEDED FOR ANXIETY 04/23/18   [name], MD  losartan (COZAAR) 50 MG tablet TAKE 1 TABLET BY MOUTH  EVERY DAY. 10/13/18   [name], MD  Multiple Vitamin (MULTIVITAMIN) capsule Take 1 capsule by mouth daily.    [provider]  Omega-3 Fatty Acids (FISH OIL) 1000 MG CPDR Take 1 capsule by mouth 2 (two) times daily.    [provider]  omeprazole (PRILOSEC) 20 MG capsule TAKE 1 CAPSULE BY MOUTH TWICE A DAY 11/17/18   [name], MD  promethazine (PHENERGAN) 25 MG tablet TAKE 1 TABLET BY MOUTH EVERY 8 HOURS AS NEEDED FOR NAUSEA AND VOMITING 03/01/19   [name], MD  rosuvastatin (CRESTOR) 5 MG tablet TAKE 1 TABLET (5 MG TOTAL) BY MOUTH DAILY AT 6 PM. 01/20/19   [name], MD  Turmeric 500 MG CAPS Take 1 Can by mouth 2 (two) times daily.     [provider]    Allergies    Sulfa antibiotics  Review of Systems   Review of Systems  Constitutional: Negative for chills and fever.  HENT: Negative for ear pain and sore throat.   Eyes: Negative for pain and visual disturbance.  Respiratory: Negative for cough and shortness of breath.   Cardiovascular: Negative for chest pain and palpitations.  Gastrointestinal: Negative for abdominal pain and vomiting.  Genitourinary: Negative for dysuria and hematuria.  Musculoskeletal: Negative for arthralgias and back pain.  Skin: Negative for color change and rash.  Neurological: Positive for dizziness. Negative for seizures and syncope.  All other systems reviewed and are negative.   Physical Exam Updated Vital Signs BP (!) 154/69 (BP Location: Left Arm)   Pulse (!) 101   Temp 98.5 F (36.9 C) (Oral)   Resp 18   SpO2 94%   Physical Exam Vitals and nursing note reviewed.  Constitutional:      General: She is not in acute distress.    Appearance: She is well-developed.  HENT:     Head: Normocephalic and atraumatic.     Right Ear: Tympanic membrane and ear canal normal.     Left Ear: Tympanic membrane and ear canal normal.  Eyes:     Conjunctiva/sclera: Conjunctivae normal.  Cardiovascular:     Rate and Rhythm: Normal rate and regular rhythm.     Heart sounds: No murmur.  Pulmonary:     Effort: Pulmonary effort is normal. No respiratory distress.     Breath sounds: Normal breath sounds.  Abdominal:     Palpations: Abdomen is soft.     Tenderness: There is no abdominal tenderness.  Musculoskeletal:     Cervical back: Neck supple.  Skin:    General: Skin is warm and dry.  Neurological:     Mental Status: She is alert.     Comments: Alert, oriented x3, cranial nerves II through XII intact, 5 out of 5 strength in bilateral upper and lower extremities, sensation to light touch intact, normal finger-nose-finger, right lateral fatigable nystagmus, no vertical  nystagmus     ED Results / Procedures / Treatments   Labs (all labs ordered are listed, but only abnormal results are displayed) Labs Reviewed  BASIC METABOLIC PANEL  CBC  URINALYSIS, ROUTINE W REFLEX MICROSCOPIC  CBG MONITORING, ED    EKG None  Radiology No results found.  Procedures Procedures (including critical care time)  Medications Ordered in ED Medications  sodium chloride flush (NS) 0.9 % injection 3 mL (has no administration in time range)    ED Course  I have reviewed the triage vital signs and the nursing notes.  Pertinent labs &  imaging results that were available during my care of the patient were reviewed by me and considered in my medical decision making (see chart for details).  Clinical Course as of Apr 10 1538  Sun Apr 11, 2019  1539 Signed out to [place]   [RD]    Clinical Course User Index [RD] Lucrezia Starch, MD   MDM Rules/Calculators/A&P                      79 year old lady presents to ER with new onset vertigo.  On exam well-appearing, stable vitals, no neurologic deficits.  Labs within normal limits, EKG unremarkable.  MRI ordered to rule out central etiology, stroke.  While awaiting MRI results, patient signed out to Dr. Gilford Raid.  Anticipate discharge if MRI negative and symptoms controlled.    Final Clinical Impression(s) / ED Diagnoses Final diagnoses:  Vertigo    Rx / DC Orders ED Discharge Orders    None       Lucrezia Starch, MD 04/11/19 [id number]

## 2019-04-11 NOTE — ED Notes (Signed)
Patient transported to MRI 

## 2019-04-16 ENCOUNTER — Emergency Department (HOSPITAL_COMMUNITY): Payer: PPO

## 2019-04-16 ENCOUNTER — Encounter (HOSPITAL_COMMUNITY): Payer: Self-pay | Admitting: Emergency Medicine

## 2019-04-16 ENCOUNTER — Inpatient Hospital Stay (HOSPITAL_COMMUNITY)
Admission: EM | Admit: 2019-04-16 | Discharge: 2019-04-20 | DRG: 177 | Disposition: A | Discharge status: Home Health | Payer: PPO | Attending: Internal Medicine | Admitting: Internal Medicine

## 2019-04-16 ENCOUNTER — Other Ambulatory Visit: Payer: Self-pay

## 2019-04-16 DIAGNOSIS — E785 Hyperlipidemia, unspecified: Secondary | ICD-10-CM | POA: Diagnosis not present

## 2019-04-16 DIAGNOSIS — Z66 Do not resuscitate: Secondary | ICD-10-CM | POA: Diagnosis present

## 2019-04-16 DIAGNOSIS — F419 Anxiety disorder, unspecified: Secondary | ICD-10-CM | POA: Diagnosis not present

## 2019-04-16 DIAGNOSIS — R110 Nausea: Secondary | ICD-10-CM | POA: Diagnosis not present

## 2019-04-16 DIAGNOSIS — Z825 Family history of asthma and other chronic lower respiratory diseases: Secondary | ICD-10-CM | POA: Diagnosis not present

## 2019-04-16 DIAGNOSIS — I472 Ventricular tachycardia: Secondary | ICD-10-CM | POA: Diagnosis not present

## 2019-04-16 DIAGNOSIS — J1282 Pneumonia due to coronavirus disease 2019: Secondary | ICD-10-CM | POA: Diagnosis not present

## 2019-04-16 DIAGNOSIS — Z96651 Presence of right artificial knee joint: Secondary | ICD-10-CM | POA: Diagnosis present

## 2019-04-16 DIAGNOSIS — U071 COVID-19: Principal | ICD-10-CM

## 2019-04-16 DIAGNOSIS — M4125 Other idiopathic scoliosis, thoracolumbar region: Secondary | ICD-10-CM | POA: Diagnosis not present

## 2019-04-16 DIAGNOSIS — Z8249 Family history of ischemic heart disease and other diseases of the circulatory system: Secondary | ICD-10-CM

## 2019-04-16 DIAGNOSIS — Z87891 Personal history of nicotine dependence: Secondary | ICD-10-CM | POA: Diagnosis not present

## 2019-04-16 DIAGNOSIS — R000 Tachycardia, unspecified: Secondary | ICD-10-CM | POA: Diagnosis not present

## 2019-04-16 DIAGNOSIS — R05 Cough: Secondary | ICD-10-CM | POA: Diagnosis not present

## 2019-04-16 DIAGNOSIS — R42 Dizziness and giddiness: Secondary | ICD-10-CM | POA: Diagnosis not present

## 2019-04-16 DIAGNOSIS — I10 Essential (primary) hypertension: Secondary | ICD-10-CM | POA: Diagnosis present

## 2019-04-16 DIAGNOSIS — M5136 Other intervertebral disc degeneration, lumbar region: Secondary | ICD-10-CM | POA: Diagnosis present

## 2019-04-16 DIAGNOSIS — K219 Gastro-esophageal reflux disease without esophagitis: Secondary | ICD-10-CM | POA: Diagnosis present

## 2019-04-16 DIAGNOSIS — Z803 Family history of malignant neoplasm of breast: Secondary | ICD-10-CM | POA: Diagnosis not present

## 2019-04-16 DIAGNOSIS — M810 Age-related osteoporosis without current pathological fracture: Secondary | ICD-10-CM | POA: Diagnosis present

## 2019-04-16 DIAGNOSIS — G629 Polyneuropathy, unspecified: Secondary | ICD-10-CM | POA: Diagnosis not present

## 2019-04-16 DIAGNOSIS — H8110 Benign paroxysmal vertigo, unspecified ear: Secondary | ICD-10-CM | POA: Diagnosis not present

## 2019-04-16 DIAGNOSIS — Z7982 Long term (current) use of aspirin: Secondary | ICD-10-CM

## 2019-04-16 DIAGNOSIS — J9601 Acute respiratory failure with hypoxia: Secondary | ICD-10-CM | POA: Diagnosis present

## 2019-04-16 DIAGNOSIS — Z79899 Other long term (current) drug therapy: Secondary | ICD-10-CM | POA: Diagnosis not present

## 2019-04-16 DIAGNOSIS — Z882 Allergy status to sulfonamides status: Secondary | ICD-10-CM | POA: Diagnosis not present

## 2019-04-16 LAB — COMPREHENSIVE METABOLIC PANEL
ALT: 22 U/L (ref 0–44)
AST: 28 U/L (ref 15–41)
Albumin: 3.1 g/dL — ABNORMAL LOW (ref 3.5–5.0)
Alkaline Phosphatase: 50 U/L (ref 38–126)
Anion gap: 14 (ref 5–15)
BUN: 11 mg/dL (ref 8–23)
CO2: 29 mmol/L (ref 22–32)
Calcium: 8.4 mg/dL — ABNORMAL LOW (ref 8.9–10.3)
Chloride: 99 mmol/L (ref 98–111)
Creatinine, Ser: 0.74 mg/dL (ref 0.44–1.00)
GFR calc Af Amer: >60 mL/min (ref >60)
GFR calc non Af Amer: >60 mL/min (ref >60)
Glucose, Bld: 102 mg/dL — ABNORMAL HIGH (ref 70–99)
Potassium: 3.6 mmol/L (ref 3.5–5.1)
Sodium: 142 mmol/L (ref 135–145)
Total Bilirubin: 0.8 mg/dL (ref 0.3–1.2)
Total Protein: 6.2 g/dL — ABNORMAL LOW (ref 6.5–8.1)

## 2019-04-16 LAB — CBC WITH DIFFERENTIAL/PLATELET
Abs Immature Granulocytes: 0.06 10*3/uL (ref 0.00–0.07)
Basophils Absolute: 0 10*3/uL (ref 0.0–0.1)
Basophils Relative: 0 %
Eosinophils Absolute: 0 10*3/uL (ref 0.0–0.5)
Eosinophils Relative: 0 %
HCT: 46.4 % — ABNORMAL HIGH (ref 36.0–46.0)
Hemoglobin: 15.2 g/dL — ABNORMAL HIGH (ref 12.0–15.0)
Immature Granulocytes: 1 %
Lymphocytes Relative: 15 %
Lymphs Abs: 0.8 10*3/uL (ref 0.7–4.0)
MCH: 33.3 pg (ref 26.0–34.0)
MCHC: 32.8 g/dL (ref 30.0–36.0)
MCV: 101.8 fL — ABNORMAL HIGH (ref 80.0–100.0)
Monocytes Absolute: 0.4 10*3/uL (ref 0.1–1.0)
Monocytes Relative: 7 %
Neutro Abs: 4.1 10*3/uL (ref 1.7–7.7)
Neutrophils Relative %: 77 %
Platelets: 191 10*3/uL (ref 150–400)
RBC: 4.56 MIL/uL (ref 3.87–5.11)
RDW: 13.1 % (ref 11.5–15.5)
WBC: 5.4 10*3/uL (ref 4.0–10.5)
nRBC: 0 % (ref 0.0–0.2)

## 2019-04-16 LAB — CBC
HCT: 43.7 % (ref 36.0–46.0)
Hemoglobin: 14.4 g/dL (ref 12.0–15.0)
MCH: 33.5 pg (ref 26.0–34.0)
MCHC: 33 g/dL (ref 30.0–36.0)
MCV: 101.6 fL — ABNORMAL HIGH (ref 80.0–100.0)
Platelets: 164 10*3/uL (ref 150–400)
RBC: 4.3 MIL/uL (ref 3.87–5.11)
RDW: 13.1 % (ref 11.5–15.5)
WBC: 4.8 10*3/uL (ref 4.0–10.5)
nRBC: 0 % (ref 0.0–0.2)

## 2019-04-16 LAB — DIFFERENTIAL
Abs Immature Granulocytes: 0.07 10*3/uL (ref 0.00–0.07)
Basophils Absolute: 0 10*3/uL (ref 0.0–0.1)
Basophils Relative: 0 %
Eosinophils Absolute: 0 10*3/uL (ref 0.0–0.5)
Eosinophils Relative: 0 %
Immature Granulocytes: 2 %
Lymphocytes Relative: 14 %
Lymphs Abs: 0.7 10*3/uL (ref 0.7–4.0)
Monocytes Absolute: 0.4 10*3/uL (ref 0.1–1.0)
Monocytes Relative: 8 %
Neutro Abs: 3.7 10*3/uL (ref 1.7–7.7)
Neutrophils Relative %: 76 %

## 2019-04-16 LAB — C-REACTIVE PROTEIN: CRP: 6.5 mg/dL — ABNORMAL HIGH (ref <1.0)

## 2019-04-16 LAB — LACTIC ACID, PLASMA: Lactic Acid, Venous: 1.4 mmol/L (ref 0.5–1.9)

## 2019-04-16 LAB — LACTATE DEHYDROGENASE: LDH: 275 U/L — ABNORMAL HIGH (ref 98–192)

## 2019-04-16 LAB — CREATININE, SERUM
Creatinine, Ser: 0.61 mg/dL (ref 0.44–1.00)
GFR calc Af Amer: >60 mL/min (ref >60)
GFR calc non Af Amer: >60 mL/min (ref >60)

## 2019-04-16 LAB — PROCALCITONIN: Procalcitonin: <0.1 ng/mL

## 2019-04-16 LAB — POC SARS CORONAVIRUS 2 AG -  ED: SARS Coronavirus 2 Ag: POSITIVE — AB

## 2019-04-16 LAB — TRIGLYCERIDES: Triglycerides: 76 mg/dL (ref <150)

## 2019-04-16 LAB — FIBRINOGEN: Fibrinogen: 742 mg/dL — ABNORMAL HIGH (ref 210–475)

## 2019-04-16 LAB — FERRITIN: Ferritin: 939 ng/mL — ABNORMAL HIGH (ref 11–307)

## 2019-04-16 LAB — D-DIMER, QUANTITATIVE: D-Dimer, Quant: 0.98 ug/mL-FEU — ABNORMAL HIGH (ref 0.00–0.50)

## 2019-04-16 LAB — TROPONIN I (HIGH SENSITIVITY)
Troponin I (High Sensitivity): 7 ng/L (ref <18)
Troponin I (High Sensitivity): 9 ng/L (ref <18)

## 2019-04-16 MED ORDER — SODIUM CHLORIDE 0.9 % IV SOLN
100.0000 mg | Freq: Every day | INTRAVENOUS | Status: AC
  Administered 2019-04-17 – 2019-04-20 (×4): 100 mg via INTRAVENOUS
  Filled 2019-04-16: qty 20
  Filled 2019-04-16: qty 100
  Filled 2019-04-16: qty 20
  Filled 2019-04-16: qty 100

## 2019-04-16 MED ORDER — HYDROCODONE-ACETAMINOPHEN 5-325 MG PO TABS: 1.0000 | ORAL_TABLET | Freq: Four times a day (QID) | ORAL | Status: DC | PRN

## 2019-04-16 MED ORDER — GABAPENTIN 300 MG PO CAPS
300.0000 mg | ORAL_CAPSULE | Freq: Four times a day (QID) | ORAL | Status: DC
  Administered 2019-04-16 – 2019-04-20 (×15): 300 mg via ORAL
  Filled 2019-04-16 (×16): qty 1

## 2019-04-16 MED ORDER — SODIUM CHLORIDE 0.9 % IV BOLUS
1000.0000 mL | Freq: Once | INTRAVENOUS | Status: AC
  Administered 2019-04-16: 17:00:00 1000 mL via INTRAVENOUS

## 2019-04-16 MED ORDER — ASPIRIN EC 81 MG PO TBEC
81.0000 mg | DELAYED_RELEASE_TABLET | Freq: Every day | ORAL | Status: DC
  Administered 2019-04-17 – 2019-04-20 (×4): 81 mg via ORAL
  Filled 2019-04-16 (×4): qty 1

## 2019-04-16 MED ORDER — LORAZEPAM 1 MG PO TABS
1.0000 mg | ORAL_TABLET | Freq: Two times a day (BID) | ORAL | Status: DC | PRN
  Administered 2019-04-17 – 2019-04-18 (×3): 1 mg via ORAL
  Filled 2019-04-16 (×3): qty 1

## 2019-04-16 MED ORDER — SODIUM CHLORIDE 0.9 % IV SOLN: 100.0000 mg | Freq: Every day | INTRAVENOUS | Status: DC

## 2019-04-16 MED ORDER — DEXAMETHASONE SODIUM PHOSPHATE 10 MG/ML IJ SOLN
6.0000 mg | INTRAMUSCULAR | Status: DC
  Administered 2019-04-16 – 2019-04-20 (×4): 6 mg via INTRAVENOUS
  Filled 2019-04-16 (×5): qty 1

## 2019-04-16 MED ORDER — SODIUM CHLORIDE 0.9 % IV SOLN
200.0000 mg | Freq: Once | INTRAVENOUS | Status: AC
  Administered 2019-04-16: 19:00:00 200 mg via INTRAVENOUS
  Filled 2019-04-16: qty 40

## 2019-04-16 MED ORDER — PANTOPRAZOLE SODIUM 40 MG PO TBEC
40.0000 mg | DELAYED_RELEASE_TABLET | Freq: Every day | ORAL | Status: DC
  Administered 2019-04-16 – 2019-04-20 (×5): 40 mg via ORAL
  Filled 2019-04-16 (×5): qty 1

## 2019-04-16 MED ORDER — SODIUM CHLORIDE 0.9 % IV SOLN: 200.0000 mg | Freq: Once | INTRAVENOUS | Status: DC

## 2019-04-16 MED ORDER — MECLIZINE HCL 25 MG PO TABS
25.0000 mg | ORAL_TABLET | Freq: Once | ORAL | Status: AC
  Administered 2019-04-16: 17:00:00 25 mg via ORAL
  Filled 2019-04-16: qty 1

## 2019-04-16 MED ORDER — ACETAMINOPHEN 325 MG PO TABS
650.0000 mg | ORAL_TABLET | Freq: Once | ORAL | Status: AC
  Administered 2019-04-16: 17:00:00 650 mg via ORAL
  Filled 2019-04-16: qty 2

## 2019-04-16 MED ORDER — SODIUM CHLORIDE 0.9% FLUSH
3.0000 mL | Freq: Two times a day (BID) | INTRAVENOUS | Status: DC
  Administered 2019-04-17 – 2019-04-19 (×6): 3 mL via INTRAVENOUS

## 2019-04-16 MED ORDER — ENOXAPARIN SODIUM 40 MG/0.4ML ~~LOC~~ SOLN
40.0000 mg | SUBCUTANEOUS | Status: DC
  Administered 2019-04-16: 20:00:00 40 mg via SUBCUTANEOUS
  Filled 2019-04-16: qty 0.4

## 2019-04-16 NOTE — ED Notes (Signed)
When pt ambulated from wheelchair to bed pt stated that she was dizzy. Pt needed assistance with standing and walking.

## 2019-04-16 NOTE — ED Notes (Signed)
The pt has been talking to her family on her phone  They know that she is being admoitted

## 2019-04-16 NOTE — ED Triage Notes (Signed)
Patient c/o dizziness onset of Saturday morning and was evaluated here. States she felt better for about a day or two then started to feel dizzy and light headed again. Patient also c/o coughing up lots of mucus. Patient alert and orientated x 4.

## 2019-04-16 NOTE — ED Notes (Signed)
Duplicate orders already resulted  Canceling the orders already resulted

## 2019-04-16 NOTE — ED Notes (Signed)
Admitting doctor here to see 

## 2019-04-16 NOTE — ED Notes (Signed)
No pain or discomfort

## 2019-04-16 NOTE — Progress Notes (Signed)
Ambulated pt in room. Pt c/o of dizziness. O2 range 93-95% on RA. Pt placed back in bed, o2 dropped to 88% on RA. Pt denies SOB or pain. Provider will be notified.

## 2019-04-16 NOTE — H&P (Signed)
.   HPI  Christina [facility]ons [id number] DOB: Aug 14, 1940 DOA: 04/16/2019  PCP: [name], MD   Chief Complaint: Dizziness, Covid positive  HPI:  79 year old white female known history of chronic sinusitis with seasonal variation HTN,, prior rectocele cystocele status post repair initially presented 04/11/2019 dizziness feeling ill to emergency room Emergency room physician worked up-MRI was negative-patient sent home with meclizine States dizziness has persisted-worsens with positional changes no specific 1 position feels like the room is spinning however ongoing from recumbent to standing No unilateral weakness no strokelike symptoms no seizure Also has had cough since 1/10 which is persisted-patient felt like this was her usual sinus issues-has not been moving around or doing as much as she usually does-usually very active and able to do her ADLs IADLs independently still driving AT[employer] No exposures that she knows of to coronavirus Because of increasing weakness and dizziness she felt she should be evaluated   Review of Systems:  - Chest pain - nausea - blurred vision - double vision - fever - chills - dark stool - tarry stool - anosmia-- frequency-- dysuria  Pertinent +'s: Appetite lack, weakness, dizziness, Malaise ED Course: found in ED to be Covid positive, meclizine given, bolus IV fluid 1000 cc given, CXR obtained, urine analysis canceled given lack of symptoms   Past Medical History:  Diagnosis Date  . DDD (degenerative disc disease), lumbar   . GERD (gastroesophageal reflux disease)   . Hyperlipidemia   . Hypertension   . Osteomyelitis ([place]) 1946   both legs  . Osteoporosis   . Other idiopathic scoliosis, thoracolumbar region    Past Surgical History:  Procedure Laterality Date  . ABDOMINAL HYSTERECTOMY    . CHOLECYSTECTOMY    . JOINT REPLACEMENT     right knee    reports that she quit smoking about 39 years ago. Her smoking use included cigarettes. She has a  2.50 pack-year smoking history. She has never used smokeless tobacco. She reports that she does not drink alcohol or use drugs.  Mobility: Independent at baseline  Allergies  Allergen Reactions  . Sulfa Antibiotics Hives   Family History  Problem Relation Age of Onset  . Asthma Mother   . Asthma Sister   . Heart failure Father   . Breast cancer Sister   . Breast cancer Maternal Aunt    Prior to Admission medications   Medication Sig Start Date End Date Taking? Authorizing Provider  albuterol (PROVENTIL HFA;VENTOLIN HFA) 108 (90 Base) MCG/ACT inhaler Inhale 2 puffs into the lungs every 6 (six) hours as needed for wheezing or shortness of breath. 04/12/16   Alycia Rossetti, MD  aspirin EC 81 MG tablet Take 81 mg by mouth daily.    [provider]  cetirizine (ZYRTEC) 10 MG tablet Take 10 mg by mouth at bedtime. 03/26/19   [provider]  cholecalciferol (VITAMIN D) 1000 units tablet Take 1,000 Units by mouth 2 (two) times daily.    [provider]  fluticasone (FLONASE) 50 MCG/ACT nasal spray Place 1 spray into both nostrils every 12 (twelve) hours as needed for allergies.  03/28/19   [provider]  gabapentin (NEURONTIN) 100 MG capsule Take 300 mg by mouth 4 (four) times daily. 02/26/19   [provider]  hydrochlorothiazide (HYDRODIURIL) 25 MG tablet Take 1 tablet (25 mg total) by mouth daily. Patient not taking: Reported on 04/11/2019 05/22/17   [name], MD  HYDROcodone-acetaminophen (NORCO/VICODIN) 5-325 MG tablet Take 1 tablet  by mouth every 6 (six) hours as needed. Chronic Pain. Dx: G89.4 Patient taking differently: Take 1 tablet by mouth every 6 (six) hours as needed for moderate pain or severe pain. Chronic Pain. Dx: G89.4 12/29/18   [name], MD  LORazepam (ATIVAN) 1 MG tablet TAKE 1 TABLET BY MOUTH TWICE A DAY AS NEEDED FOR ANXIETY Patient taking differently: Take 1 mg by mouth 2 (two) times daily as needed for  anxiety.  04/23/18   [name], MD  losartan (COZAAR) 50 MG tablet TAKE 1 TABLET BY MOUTH EVERY DAY. Patient taking differently: Take 50 mg by mouth daily.  10/13/18   [name], MD  meclizine (ANTIVERT) 12.5 MG tablet Take 1 tablet (12.5 mg total) by mouth 3 (three) times daily as needed for dizziness. 04/11/19   Lucrezia Starch, MD  meclizine (ANTIVERT) 25 MG tablet Take 1 tablet (25 mg total) by mouth 3 (three) times daily as needed for dizziness. 04/11/19   Isla Pence, MD  Multiple Vitamin (MULTIVITAMIN) capsule Take 1 capsule by mouth daily.    [provider]  omeprazole (PRILOSEC) 20 MG capsule TAKE 1 CAPSULE BY MOUTH TWICE A DAY Patient taking differently: Take 20 mg by mouth 2 (two) times daily before a meal.  11/17/18   Pickard, Cammie Mcgee, MD  ondansetron (ZOFRAN ODT) 4 MG disintegrating tablet Take 1 tablet (4 mg total) by mouth every 8 (eight) hours as needed. 04/11/19   Isla Pence, MD  promethazine (PHENERGAN) 25 MG tablet TAKE 1 TABLET BY MOUTH EVERY 8 HOURS AS NEEDED FOR NAUSEA AND VOMITING Patient taking differently: Take 25 mg by mouth every 8 (eight) hours as needed for nausea or vomiting.  03/01/19   [name], MD  rosuvastatin (CRESTOR) 5 MG tablet TAKE 1 TABLET (5 MG TOTAL) BY MOUTH DAILY AT 6 PM. Patient not taking: Reported on 04/11/2019 01/20/19   [name], MD  simvastatin (ZOCOR) 20 MG tablet Take 20 mg by mouth daily.    [provider]    Physical Exam:  Vitals:   04/16/19 1530 04/16/19 1630  BP: 134/76 135/83  Pulse: 98 (!) 102  Resp: 20 19  Temp:    SpO2: 94% 94%     Awake coherent pleasant looking younger than stated age white female  Neck not checked no lymphadenopathy no thyromegaly Mallampati 2  S1-S2 no murmur slightly tachycardic 100 range sinus on monitors  Chest relatively clear no added sound bilaterally and bibasilar posterior lung fields  Abdomen soft no hepatosplenomegaly no  guarding  No lower extremity edema  Neurologically intact raises all 4 limbs equally of bed  When I examine her and ask her to follow and trak with her head stationary-I note some nystagmus with a quick beat to the side that she tracks with  I have personally reviewed following labs and imaging studies  Labs:  BUN/creatinine 11/0.7 LFTs relatively normal other than mildly low albumin of 3.1 total protein 6.2 WBC 5.4 hemoglobin 15.2 platelet count 191  COVID-19 Labs  Recent Labs    04/16/19 1630  DDIMER 0.98*  LDH 275*   No results found for: Veteran     Imaging studies:   CXR reviewed shows left sided vague pneumonia  Medical tests:   EKG independently reviewed: Sinus tachycardia PR interval 0.08 QRS axis about 45 degrees no ST-T wave changes across precordium other than rate no concerns for ACS  Test discussed with performing physician:  Discussed with Dr.  Gholston  Decision to obtain old records:   Reviewed  Review and summation of old records:   Reviewed  Active Problems:   * No active hospital problems. *   Assessment/Plan Vertigo Sounds very much BPPV-like-we will obtain therapy eval vestibular rehab-at this juncture hold antihypertensives although she is not volume depleted Hopefully habituation exercises Dix-Hallpike E etc. can help with correction if this is the case MRI done recently in ED 1/10 + 1/11 Hold meclizine at this time as this may alter the testing as per vestibular therapist COVID-19 pneumonia Not all inflammatory markers are back but she became a little hypoxic with ambulation just a couple of steps in the room likely more from deconditioning because of her weakness and deconditioning overall I will start steroids and remdesivir and monitor the trends of her markers She may be a good candidate if she improved significantly for outpatient completion of remdesivir HTN Continue losartan 50, HCTZ 25 once work-up by PT OT and once we  get orthostatics-would hold for now Neuropathy NOS Resume gabapentin 300 4 times daily Hyperlipidemia Will need clarification in the outpatient setting as to whether it is Crestor is Zocor that she is on-hold both for now at this time nonessential Anxiety Continue Ativan 1 mg twice daily anxiety     Severity of Illness: The appropriate patient status for this patient is INPATIENT. Inpatient status is judged to be reasonable and necessary in order to provide the required intensity of service to ensure the patient's safety. The patient's presenting symptoms, physical exam findings, and initial radiographic and laboratory data in the context of their chronic comorbidities is felt to place them at high risk for further clinical deterioration. Furthermore, it is not anticipated that the patient will be medically stable for discharge from the hospital within 2 midnights of admission. The following factors support the patient status of inpatient.   " The patient's presenting symptoms include dizzy. " The worrisome physical exam findings include tachycardia, dizzy, hypoxia. " The initial radiographic and laboratory data are worrisome because of pna. " The chronic co-morbidities include multiple.   * I certify that at the point of admission it is my clinical judgment that the patient will require inpatient hospital care spanning beyond 2 midnights from the point of admission due to high intensity of service, high risk for further deterioration and high frequency of surveillance required.*   DVT prophylaxis:loveneox Code Status: DNR copnfirmed Family Communication:  no Consults called: no    Time spent: 58 minutes  Verlon Au, MD [days-call my NP partners at night for Care related issues] [facility] --[facility] [employer] [place] , [email or web address]; password [facility]  04/16/2019, 5:11 PM

## 2019-04-16 NOTE — ED Provider Notes (Signed)
[facility] EMERGENCY DEPARTMENT Provider Note   CSN: [id number] Arrival date & time: 04/16/19  1428     History Chief Complaint  Patient presents with  . Tachycardia  . Dizziness    Christina Lyons is a 79 y.o. female.  HPI 79 year old female presents with dizziness.  Has been ongoing for about 5 days.  Was seen here when it first started and discharged after negative MRI.  She states whenever she stands up she will get lightheaded.  Does not feel like squint a pass out but more like she is going to fall over.  There is no headache, neck stiffness, vision changes.  Has not noticed a fever though her triage temperature today is 100.2.  She has also noticed a cough over the same time but now is starting to have some brown sputum.  No vomiting.  She had one loose stool.  No abdominal pain, chest pain, shortness of breath or urinary complaints.  No leg swelling.   Past Medical History:  Diagnosis Date  . DDD (degenerative disc disease), lumbar   . GERD (gastroesophageal reflux disease)   . Hyperlipidemia   . Hypertension   . Osteomyelitis ([place]) 1946   both legs  . Osteoporosis   . Other idiopathic scoliosis, thoracolumbar region     Patient Active Problem List   Diagnosis Date Noted  . COVID-19 04/16/2019  . Cough 10/06/2015  . S/P total knee arthroplasty 01/07/2013  . Lymphadenopathy 12/14/2012  . Sinusitis, acute 12/14/2012  . Hyperlipidemia   . Hypertension   . Other idiopathic scoliosis, thoracolumbar region   . Osteoporosis   . DDD (degenerative disc disease), lumbar     Past Surgical History:  Procedure Laterality Date  . ABDOMINAL HYSTERECTOMY    . CHOLECYSTECTOMY    . JOINT REPLACEMENT     right knee     OB History   No obstetric history on file.     Family History  Problem Relation Age of Onset  . Asthma Mother   . Asthma Sister   . Heart failure Father   . Breast cancer Sister   . Breast cancer Maternal Aunt     Social  History   Tobacco Use  . Smoking status: Former Smoker    Packs/day: 0.50    Years: 5.00    Pack years: 2.50    Types: Cigarettes    Quit date: 04/01/1980    Years since quitting: 39.0  . Smokeless tobacco: Never Used  Substance Use Topics  . Alcohol use: No    Alcohol/week: 0.0 standard drinks  . Drug use: No    Home Medications Prior to Admission medications   Medication Sig Start Date End Date Taking? Authorizing Provider  albuterol (PROVENTIL HFA;VENTOLIN HFA) 108 (90 Base) MCG/ACT inhaler Inhale 2 puffs into the lungs every 6 (six) hours as needed for wheezing or shortness of breath. 04/12/16   Alycia Rossetti, MD  aspirin EC 81 MG tablet Take 81 mg by mouth daily.    [provider]  cetirizine (ZYRTEC) 10 MG tablet Take 10 mg by mouth at bedtime. 03/26/19   [provider]  cholecalciferol (VITAMIN D) 1000 units tablet Take 1,000 Units by mouth 2 (two) times daily.    [provider]  fluticasone (FLONASE) 50 MCG/ACT nasal spray Place 1 spray into both nostrils every 12 (twelve) hours as needed for allergies.  03/28/19   [provider]  gabapentin (NEURONTIN) 100 MG capsule Take 300 mg  by mouth 4 (four) times daily. 02/26/19   [provider]  hydrochlorothiazide (HYDRODIURIL) 25 MG tablet Take 1 tablet (25 mg total) by mouth daily. Patient not taking: Reported on 04/11/2019 05/22/17   [name], MD  HYDROcodone-acetaminophen (NORCO/VICODIN) 5-325 MG tablet Take 1 tablet by mouth every 6 (six) hours as needed. Chronic Pain. Dx: G89.4 Patient taking differently: Take 1 tablet by mouth every 6 (six) hours as needed for moderate pain or severe pain. Chronic Pain. Dx: G89.4 12/29/18   [name], MD  LORazepam (ATIVAN) 1 MG tablet TAKE 1 TABLET BY MOUTH TWICE A DAY AS NEEDED FOR ANXIETY Patient taking differently: Take 1 mg by mouth 2 (two) times daily as needed for anxiety.  04/23/18   [name], MD  losartan (COZAAR)  50 MG tablet TAKE 1 TABLET BY MOUTH EVERY DAY. Patient taking differently: Take 50 mg by mouth daily.  10/13/18   [name], MD  meclizine (ANTIVERT) 12.5 MG tablet Take 1 tablet (12.5 mg total) by mouth 3 (three) times daily as needed for dizziness. 04/11/19   Lucrezia Starch, MD  meclizine (ANTIVERT) 25 MG tablet Take 1 tablet (25 mg total) by mouth 3 (three) times daily as needed for dizziness. 04/11/19   Isla Pence, MD  Multiple Vitamin (MULTIVITAMIN) capsule Take 1 capsule by mouth daily.    [provider]  omeprazole (PRILOSEC) 20 MG capsule TAKE 1 CAPSULE BY MOUTH TWICE A DAY Patient taking differently: Take 20 mg by mouth 2 (two) times daily before a meal.  11/17/18   Pickard, Cammie Mcgee, MD  ondansetron (ZOFRAN ODT) 4 MG disintegrating tablet Take 1 tablet (4 mg total) by mouth every 8 (eight) hours as needed. 04/11/19   Isla Pence, MD  promethazine (PHENERGAN) 25 MG tablet TAKE 1 TABLET BY MOUTH EVERY 8 HOURS AS NEEDED FOR NAUSEA AND VOMITING Patient taking differently: Take 25 mg by mouth every 8 (eight) hours as needed for nausea or vomiting.  03/01/19   [name], MD  rosuvastatin (CRESTOR) 5 MG tablet TAKE 1 TABLET (5 MG TOTAL) BY MOUTH DAILY AT 6 PM. Patient not taking: Reported on 04/11/2019 01/20/19   [name], MD  simvastatin (ZOCOR) 20 MG tablet Take 20 mg by mouth daily.    [provider]    Allergies    Sulfa antibiotics  Review of Systems   Review of Systems  Constitutional: Negative for fever.  Respiratory: Positive for cough. Negative for shortness of breath.   Cardiovascular: Negative for chest pain and leg swelling.  Gastrointestinal: Negative for abdominal pain.  Genitourinary: Negative for dysuria.  Musculoskeletal: Negative for neck pain and neck stiffness.  Neurological: Positive for dizziness and light-headedness. Negative for headaches.  All other systems reviewed and are negative.   Physical Exam Updated  Vital Signs BP 135/83   Pulse (!) 102   Temp 100.2 F (37.9 C) (Oral)   Resp 19   SpO2 94%   Physical Exam Vitals and nursing note reviewed.  Constitutional:      General: She is not in acute distress.    Appearance: She is well-developed. She is not ill-appearing or diaphoretic.  HENT:     Head: Normocephalic and atraumatic.     Right Ear: External ear normal.     Left Ear: External ear normal.     Nose: Nose normal.  Eyes:     General:        Right eye: No discharge.  Left eye: No discharge.     Extraocular Movements: Extraocular movements intact.  Cardiovascular:     Rate and Rhythm: Regular rhythm. Tachycardia present.     Heart sounds: Normal heart sounds.  Pulmonary:     Effort: Pulmonary effort is normal.     Breath sounds: Examination of the right-lower field reveals rales. Rales (mild) present.  Abdominal:     Palpations: Abdomen is soft.     Tenderness: There is no abdominal tenderness.  Musculoskeletal:     Cervical back: Normal range of motion and neck supple. No rigidity.  Skin:    General: Skin is warm and dry.  Neurological:     Mental Status: She is alert and oriented to person, place, and time.     Comments: CN 3-12 grossly intact. 5/5 strength in all 4 extremities. Grossly normal sensation. Normal finger to nose.   Psychiatric:        Mood and Affect: Mood is not anxious.     ED Results / Procedures / Treatments   Labs (all labs ordered are listed, but only abnormal results are displayed) Labs Reviewed  COMPREHENSIVE METABOLIC PANEL - Abnormal; Notable for the following components:      Result Value   Glucose, Bld 102 (*)    Calcium 8.4 (*)    Total Protein 6.2 (*)    Albumin 3.1 (*)    All other components within normal limits  CBC WITH DIFFERENTIAL/PLATELET - Abnormal; Notable for the following components:   Hemoglobin 15.2 (*)    HCT 46.4 (*)    MCV 101.8 (*)    All other components within normal limits  POC SARS CORONAVIRUS 2 AG  -  ED - Abnormal; Notable for the following components:   SARS Coronavirus 2 Ag POSITIVE (*)    All other components within normal limits  CULTURE, BLOOD (ROUTINE X 2)  CULTURE, BLOOD (ROUTINE X 2)  LACTIC ACID, PLASMA  URINALYSIS, ROUTINE W REFLEX MICROSCOPIC  D-DIMER, QUANTITATIVE (NOT AT [facility])  PROCALCITONIN  LACTATE DEHYDROGENASE  FERRITIN  TRIGLYCERIDES  FIBRINOGEN  C-REACTIVE PROTEIN  TROPONIN I (HIGH SENSITIVITY)  TROPONIN I (HIGH SENSITIVITY)    EKG EKG Interpretation  Date/Time:  Friday April 16 2019 14:30:42 EST Ventricular Rate:  115 PR Interval:  162 QRS Duration: 86 QT Interval:  332 QTC Calculation: 459 R Axis:   55 Text Interpretation: Sinus tachycardia Nonspecific ST abnormality Abnormal ECG rate is faster compared to Apr 11 2019 Confirmed by Sherwood Gambler [id number]) on 04/16/2019 3:17:34 PM   Radiology DG Chest Portable 1 View  Result Date: 04/16/2019 CLINICAL DATA:  Cough EXAM: PORTABLE CHEST 1 VIEW COMPARISON:  07/04/2016 FINDINGS: Vague foci of left greater than right peripheral opacity. No pleural effusion. Normal heart size. No pneumothorax. IMPRESSION: Mild left greater than right vague peripheral opacity, suggesting possible atypical or viral pneumonia. Electronically Signed   By: Donavan Foil M.D.   On: 04/16/2019 16:58    Procedures Procedures (including critical care time)  Medications Ordered in ED Medications  acetaminophen (TYLENOL) tablet 650 mg (650 mg Oral Given 04/16/19 1644)  sodium chloride 0.9 % bolus 1,000 mL (1,000 mLs Intravenous New Bag/Given 04/16/19 1649)  meclizine (ANTIVERT) tablet 25 mg (25 mg Oral Given 04/16/19 1644)    ED Course  I have reviewed the triage vital signs and the nursing notes.  Pertinent labs & imaging results that were available during my care of the patient were reviewed by me and considered in my medical decision making (  see chart for details).    MDM Rules/Calculators/A&P                       Patient is not in distress.  Borderline low sats in the low 90s.  When she was ambulated, she was lightheaded but also dropped her O2 sats into the high 80s.  Test positive for the novel coronavirus.  I think she will need supportive care and possibly treatments for the Covid-19 infection.  Probably better to observe her with fluids and supportive care.  Hospitalist to admit. No indication this is CNS.  Christina Lyons was evaluated in Emergency Department on 04/16/2019 for the symptoms described in the history of present illness. She was evaluated in the context of the global COVID-19 pandemic, which necessitated consideration that the patient might be at risk for infection with the SARS-CoV-2 virus that causes COVID-19. Institutional protocols and algorithms that pertain to the evaluation of patients at risk for COVID-19 are in a state of rapid change based on information released by regulatory bodies including the CDC and federal and state organizations. These policies and algorithms were followed during the patient's care in the ED.  Final Clinical Impression(s) / ED Diagnoses Final diagnoses:  COVID-19 virus infection    Rx / DC Orders ED Discharge Orders    None       Sherwood Gambler, MD 04/16/19 1717

## 2019-04-17 LAB — CBC WITH DIFFERENTIAL/PLATELET
Abs Immature Granulocytes: 0.08 10*3/uL — ABNORMAL HIGH (ref 0.00–0.07)
Basophils Absolute: 0 10*3/uL (ref 0.0–0.1)
Basophils Relative: 0 %
Eosinophils Absolute: 0 10*3/uL (ref 0.0–0.5)
Eosinophils Relative: 0 %
HCT: 42.1 % (ref 36.0–46.0)
Hemoglobin: 14.1 g/dL (ref 12.0–15.0)
Immature Granulocytes: 3 %
Lymphocytes Relative: 22 %
Lymphs Abs: 0.6 10*3/uL — ABNORMAL LOW (ref 0.7–4.0)
MCH: 33.4 pg (ref 26.0–34.0)
MCHC: 33.5 g/dL (ref 30.0–36.0)
MCV: 99.8 fL (ref 80.0–100.0)
Monocytes Absolute: 0.2 10*3/uL (ref 0.1–1.0)
Monocytes Relative: 8 %
Neutro Abs: 1.8 10*3/uL (ref 1.7–7.7)
Neutrophils Relative %: 67 %
Platelets: 185 10*3/uL (ref 150–400)
RBC: 4.22 MIL/uL (ref 3.87–5.11)
RDW: 13.1 % (ref 11.5–15.5)
WBC: 2.7 10*3/uL — ABNORMAL LOW (ref 4.0–10.5)
nRBC: 0 % (ref 0.0–0.2)

## 2019-04-17 LAB — BASIC METABOLIC PANEL
Anion gap: 13 (ref 5–15)
BUN: 12 mg/dL (ref 8–23)
CO2: 25 mmol/L (ref 22–32)
Calcium: 7.6 mg/dL — ABNORMAL LOW (ref 8.9–10.3)
Chloride: 103 mmol/L (ref 98–111)
Creatinine, Ser: 0.52 mg/dL (ref 0.44–1.00)
GFR calc Af Amer: >60 mL/min (ref >60)
GFR calc non Af Amer: >60 mL/min (ref >60)
Glucose, Bld: 116 mg/dL — ABNORMAL HIGH (ref 70–99)
Potassium: 3.8 mmol/L (ref 3.5–5.1)
Sodium: 141 mmol/L (ref 135–145)

## 2019-04-17 LAB — FERRITIN: Ferritin: 816 ng/mL — ABNORMAL HIGH (ref 11–307)

## 2019-04-17 MED ORDER — ONDANSETRON HCL 4 MG/2ML IJ SOLN
4.0000 mg | Freq: Four times a day (QID) | INTRAMUSCULAR | Status: DC
  Administered 2019-04-17 – 2019-04-19 (×3): 4 mg via INTRAVENOUS
  Filled 2019-04-17 (×5): qty 2

## 2019-04-17 MED ORDER — ENOXAPARIN SODIUM 40 MG/0.4ML ~~LOC~~ SOLN
40.0000 mg | SUBCUTANEOUS | Status: DC
  Administered 2019-04-17 – 2019-04-18 (×2): 40 mg via SUBCUTANEOUS
  Filled 2019-04-17 (×2): qty 0.4

## 2019-04-17 MED ORDER — ONDANSETRON HCL 4 MG/2ML IJ SOLN
INTRAMUSCULAR | Status: AC
  Filled 2019-04-17: qty 2

## 2019-04-17 NOTE — Progress Notes (Signed)
PROGRESS NOTE    Christina Lyons  [id number] DOB: [birth year] DOA: 04/16/2019 PCP: [name], MD    Brief Narrative:  79 year old lady with history of her hypertension prior history of rectocele, cystocele, GERD, hyperlipidemia, essential hypertension, presents to ED for generalized weakness and dizziness on arrival to ED she was tested positive for Covid.  She was also mildly hypoxic and was put on 2 L of nasal cannula oxygen to keep sats greater than 90% she was admitted to [facility] service for evaluation and management of COVID-19 infection   Assessment & Plan:   Active Problems:   COVID-19   Acute respiratory failure with hypoxia secondary to COVID-19 viral pneumonia Chest x-ray showsMild left greater than right vague peripheral opacity, suggesting possible atypical or viral pneumonia. She is currently requiring up to 2 L of nasal cannula oxygen to keep sats greater than 90% She is currently getting IV remdesivir and IV steroids. Recommend to use incentive spirometry and get out of bed and ambulate as tolerated. .  Symptomatic management with antiemetics for nausea. Follow inflammatory markers at this time. PT evaluation recommending home health PT at this time.  Essential hypertension Blood pressure parameters are well controlled.    Hyperlipidemia Follow-up outpatient.   DVT prophylaxis: Lovenox Code Status: DNR Family Communication: None at bedside Disposition Plan: Pending resolution of hypoxia.   Consultants:   None  Procedures: None  Antimicrobials: None   Subjective: Patient reports being nauseated, no chest pain or shortness of breath, no vomiting or diarrhea or abdominal pain at this time.  Objective: Vitals:   04/17/19 0121 04/17/19 0244 04/17/19 0759 04/17/19 1625  BP: 103/63 137/86 (!) 142/77 (!) 143/80  Pulse: 81 90 (!) 102 98  Resp: (!) 32 (!) 22 16 16   Temp:  97.9 F (36.6 C) 97.9 F (36.6 C) 97.8 F (36.6 C)  TempSrc:  Oral  Oral Oral  SpO2: 95% 97% 95% 93%  Weight:  94.1 kg    Height:  5\' 1"  (1.549 m)      Intake/Output Summary (Last 24 hours) at 04/17/2019 1655 Last data filed at 04/17/2019 1100 Gross per 24 hour  Intake 340 ml  Output 1 ml  Net 339 ml   Filed Weights   04/17/19 0244  Weight: 94.1 kg    Examination:  General exam: Slightly tremulous, no distress noted on 2 L of nasal cannula oxygen Respiratory system: Diminished air entry at bases Cardiovascular system: S1 & S2 heard, RRR. Marland Kitchen No pedal edema. Gastrointestinal system: Abdomen is nondistended, soft and nontender.Normal bowel sounds heard. Central nervous system: Alert and oriented.  Grossly nonfocal Extremities: Symmetric 5 x 5 power. Skin: No rashes, lesions or ulcers Psychiatry: Anxious    Data Reviewed: I have personally reviewed following labs and imaging studies  CBC: Recent Labs  Lab 04/11/19 1348 04/16/19 1452 04/16/19 1821 04/17/19 0809  WBC 3.5* 5.4 4.8 2.7*  NEUTROABS  --  4.1 3.7 1.8  HGB 14.3 15.2* 14.4 14.1  HCT 45.2 46.4* 43.7 42.1  MCV 105.1* 101.8* 101.6* 99.8  PLT 131* 191 164 [id number]   Basic Metabolic Panel: Recent Labs  Lab 04/11/19 1348 04/16/19 1452 04/16/19 1822 04/17/19 0809  NA 138 142  --  141  K 4.0 3.6  --  3.8  CL 104 99  --  103  CO2 27 29  --  25  GLUCOSE 97 102*  --  116*  BUN 12 11  --  12  CREATININE 0.64 0.74 0.61  0.52  CALCIUM 7.8* 8.4*  --  7.6*   GFR: Estimated Creatinine Clearance: 62.6 mL/min (by C-G formula based on SCr of 0.52 mg/dL). Liver Function Tests: Recent Labs  Lab 04/16/19 1452  AST 28  ALT 22  ALKPHOS 50  BILITOT 0.8  PROT 6.2*  ALBUMIN 3.1*   No results for input(s): LIPASE, AMYLASE in the last 168 hours. No results for input(s): AMMONIA in the last 168 hours. Coagulation Profile: No results for input(s): INR, PROTIME in the last 168 hours. Cardiac Enzymes: No results for input(s): CKTOTAL, CKMB, CKMBINDEX, TROPONINI in the last 168 hours. BNP  (last 3 results) No results for input(s): PROBNP in the last 8760 hours. HbA1C: No results for input(s): HGBA1C in the last 72 hours. CBG: Recent Labs  Lab 04/11/19 1341  GLUCAP 78   Lipid Profile: Recent Labs    04/16/19 1630  TRIG 76   Thyroid Function Tests: No results for input(s): TSH, T4TOTAL, FREET4, T3FREE, THYROIDAB in the last 72 hours. Anemia Panel: Recent Labs    04/16/19 1630 04/17/19 0809  FERRITIN 939* 816*   Sepsis Labs: Recent Labs  Lab 04/16/19 1459 04/16/19 1630  PROCALCITON  --  <0.10  LATICACIDVEN 1.4  --     Recent Results (from the past 240 hour(s))  Culture, blood (routine x 2)     Status: None (Preliminary result)   Collection Time: 04/16/19  2:58 PM   Specimen: BLOOD  Result Value Ref Range Status   Specimen Description BLOOD LEFT ANTECUBITAL  Final   Special Requests   Final    BOTTLES DRAWN AEROBIC AND ANAEROBIC Blood Culture adequate volume   Culture   Final    NO GROWTH < 24 HOURS Performed at [facility], [place] [address] [place], [place] [address]    Report Status PENDING  Incomplete  Culture, blood (routine x 2)     Status: None (Preliminary result)   Collection Time: 04/16/19  3:55 PM   Specimen: BLOOD RIGHT HAND  Result Value Ref Range Status   Specimen Description BLOOD RIGHT HAND  Final   Special Requests   Final    BOTTLES DRAWN AEROBIC AND ANAEROBIC Blood Culture results may not be optimal due to an inadequate volume of blood received in culture bottles   Culture   Final    NO GROWTH < 24 HOURS Performed at [facility], [place] [address] [place], [place] [address]    Report Status PENDING  Incomplete         Radiology Studies: DG Chest Portable 1 View  Result Date: 04/16/2019 CLINICAL DATA:  Cough EXAM: PORTABLE CHEST 1 VIEW COMPARISON:  07/04/2016 FINDINGS: Vague foci of left greater than right peripheral opacity. No pleural effusion. Normal heart size. No pneumothorax. IMPRESSION: Mild left greater  than right vague peripheral opacity, suggesting possible atypical or viral pneumonia. Electronically Signed   By: Donavan Foil M.D.   On: 04/16/2019 16:58        Scheduled Meds: . aspirin EC  81 mg Oral Daily  . dexamethasone (DECADRON) injection  6 mg Intravenous Q24H  . enoxaparin (LOVENOX) injection  40 mg Subcutaneous Q24H  . gabapentin  300 mg Oral QID  . ondansetron (ZOFRAN) IV  4 mg Intravenous Q6H  . pantoprazole  40 mg Oral Daily  . sodium chloride flush  3 mL Intravenous Q12H   Continuous Infusions: . remdesivir 100 mg in NS 100 mL 100 mg (04/17/19 0906)  LOS: 1 day        Hosie Poisson, MD [facility]  04/17/2019, 4:55 PM

## 2019-04-17 NOTE — ED Notes (Signed)
Pt placed on 2l [place] due to SpO2 trrending in low 90s

## 2019-04-17 NOTE — Progress Notes (Signed)
SATURATION QUALIFICATIONS: (This note is used to comply with regulatory documentation for home oxygen)  Patient Saturations on Room Air at Rest = 93%  Patient Saturations on Room Air while transferring to chair = 89%  Patient Saturations on 2 Liters of oxygen while Ambulating = 92%  Please briefly explain why patient needs home oxygen:Pt needed O2 to maintain sats >90% with activity.  Negin Hegg W,PT Acute Rehabilitation Services Pager:  [phone]  Office:  [phone]

## 2019-04-17 NOTE — Evaluation (Signed)
Physical Therapy Evaluation Patient Details Name: Christina Lyons MRN: [id number] DOB: July 04, 1940 Today's Date: 04/17/2019   History of Present Illness  79 year old Christina Lyons female known history of chronic sinusitis with seasonal variation HTN,, prior rectocele cystocele status post repair initially presented 04/11/2019 dizziness feeling ill to emergency room.  Pt  with Covid as well.   Clinical Impression  Pt admitted with above diagnosis. Pt did have left BPPV posterior canal, treated with canalith repositioning maneuver.  Pt tolerated well and was able to sit up in chair.  Pt desat to 89% on RA and needed 2L to keep sats >90%.  Will continue to follow pt.   Pt currently with functional limitations due to the deficits listed below (see PT Problem List). Pt will benefit from skilled PT to increase their independence and safety with mobility to allow discharge to the venue listed below.    Follow Up Recommendations Home health PT;Supervision/Assistance - 24 hour    Equipment Recommendations  None recommended by PT    Recommendations for Other Services       Precautions / Restrictions Precautions Precautions: Fall Restrictions Weight Bearing Restrictions: No      Mobility  Bed Mobility Overal bed mobility: Needs Assistance Bed Mobility: Rolling;Sidelying to Sit Rolling: Supervision Sidelying to sit: Min assist       General bed mobility comments: Needed min assist to come EOB with assist due to dizziness.  Assessed for BPPV and positive for Left BPPV and treated with canalith repositioning maneuver.  Pt felt better once in chair.   Transfers Overall transfer level: Needs assistance Equipment used: Rolling walker (2 wheeled) Transfers: Sit to/from [facility] Sit to Stand: Min guard Stand pivot transfers: Min guard       General transfer comment: Able to stand and pivot to the recliner with min guard assist only.    Ambulation/Gait             General  Gait Details: Just performed canalith repositioning therefore did not want to walk as she was slightly nauseated after the treatment.   Stairs            Wheelchair Mobility    Modified Rankin (Stroke Patients Only)       Balance Overall balance assessment: Needs assistance Sitting-balance support: No upper extremity supported;Feet supported Sitting balance-Leahy Scale: Fair     Standing balance support: No upper extremity supported;During functional activity Standing balance-Leahy Scale: Fair Standing balance comment: can stand statically with min guard assist, dynamic activity min assist.                              Pertinent Vitals/Pain Pain Assessment: No/denies pain    Home Living Family/patient expects to be discharged to:: Private residence Living Arrangements: Spouse/significant other(fiancee) Available Help at Discharge: Friend(s);Available PRN/intermittently Type of Home: House Home Access: Stairs to enter Entrance Stairs-Rails: Left Entrance Stairs-Number of Steps: 2 Home Layout: One level Home Equipment: Shower seat;Bedside commode;Walker - 4 wheels;Cane - single point      Prior Function Level of Independence: Independent         Comments: Drives, and does own bathing and dressing     Hand Dominance   Dominant Hand: Right    Extremity/Trunk Assessment   Upper Extremity Assessment Upper Extremity Assessment: Defer to OT evaluation    Lower Extremity Assessment Lower Extremity Assessment: Generalized weakness    Cervical / Trunk Assessment Cervical / Trunk Assessment:  Normal  Communication   Communication: No difficulties  Cognition Arousal/Alertness: Awake/alert Behavior During Therapy: WFL for tasks assessed/performed Overall Cognitive Status: Within Functional Limits for tasks assessed                                        General Comments General comments (skin integrity, edema, etc.): Pt sats  were 89% on RA.  Needed 2LO2 to keep sats >90%.      Exercises     Assessment/Plan    PT Assessment Patient needs continued PT services  PT Problem List Decreased activity tolerance;Decreased balance;Decreased mobility;Decreased knowledge of use of DME;Decreased safety awareness;Decreased knowledge of precautions;Cardiopulmonary status limiting activity       PT Treatment Interventions DME instruction;Gait training;Functional mobility training;Therapeutic activities;Therapeutic exercise;Balance training;Patient/family education;Stair training    PT Goals (Current goals can be found in the Care Plan section)  Acute Rehab PT Goals Patient Stated Goal: to go home PT Goal Formulation: With patient Time For Goal Achievement: 05/01/19 Potential to Achieve Goals: Good    Frequency Min 3X/week   Barriers to discharge        Co-evaluation               AM-PAC PT "6 Clicks" Mobility  Outcome Measure Help needed turning from your back to your side while in a flat bed without using bedrails?: A Little Help needed moving from lying on your back to sitting on the side of a flat bed without using bedrails?: A Little Help needed moving to and from a bed to a chair (including a wheelchair)?: A Little Help needed standing up from a chair using your arms (e.g., wheelchair or bedside chair)?: A Little Help needed to walk in hospital room?: A Little Help needed climbing 3-5 steps with a railing? : A Little 6 Click Score: 18    End of Session Equipment Utilized During Treatment: Gait belt;Oxygen Activity Tolerance: Patient limited by fatigue Patient left: in chair;with call bell/phone within reach Nurse Communication: Mobility status PT Visit Diagnosis: Unsteadiness on feet (R26.81);Muscle weakness (generalized) (M62.81)    Time: [id number] PT Time Calculation (min) (ACUTE ONLY): 21 min   Charges:   PT Evaluation $PT Eval Moderate Complexity: 1 Mod          Tayjon Halladay W,PT Acute  Rehabilitation Services Pager:  [phone]  Office:  [place] 04/17/2019, 2:37 PM

## 2019-04-18 LAB — CBC WITH DIFFERENTIAL/PLATELET
Abs Immature Granulocytes: 0.14 10*3/uL — ABNORMAL HIGH (ref 0.00–0.07)
Basophils Absolute: 0 10*3/uL (ref 0.0–0.1)
Basophils Relative: 0 %
Eosinophils Absolute: 0 10*3/uL (ref 0.0–0.5)
Eosinophils Relative: 0 %
HCT: 41.8 % (ref 36.0–46.0)
Hemoglobin: 13.7 g/dL (ref 12.0–15.0)
Immature Granulocytes: 2 %
Lymphocytes Relative: 13 %
Lymphs Abs: 1.2 10*3/uL (ref 0.7–4.0)
MCH: 32.9 pg (ref 26.0–34.0)
MCHC: 32.8 g/dL (ref 30.0–36.0)
MCV: 100.5 fL — ABNORMAL HIGH (ref 80.0–100.0)
Monocytes Absolute: 0.6 10*3/uL (ref 0.1–1.0)
Monocytes Relative: 7 %
Neutro Abs: 7.3 10*3/uL (ref 1.7–7.7)
Neutrophils Relative %: 78 %
Platelets: 228 10*3/uL (ref 150–400)
RBC: 4.16 MIL/uL (ref 3.87–5.11)
RDW: 13.2 % (ref 11.5–15.5)
WBC: 9.2 10*3/uL (ref 4.0–10.5)
nRBC: 0 % (ref 0.0–0.2)

## 2019-04-18 LAB — FERRITIN: Ferritin: 892 ng/mL — ABNORMAL HIGH (ref 11–307)

## 2019-04-18 LAB — MAGNESIUM: Magnesium: 2.2 mg/dL (ref 1.7–2.4)

## 2019-04-18 MED ORDER — LORAZEPAM 1 MG PO TABS
1.0000 mg | ORAL_TABLET | Freq: Two times a day (BID) | ORAL | Status: DC | PRN
  Administered 2019-04-19 – 2019-04-20 (×2): 1 mg via ORAL
  Filled 2019-04-18 (×2): qty 1

## 2019-04-18 MED ORDER — LORAZEPAM 1 MG PO TABS: 1.0000 mg | ORAL_TABLET | Freq: Three times a day (TID) | ORAL | Status: DC | PRN

## 2019-04-18 MED ORDER — POTASSIUM CHLORIDE CRYS ER 20 MEQ PO TBCR
20.0000 meq | EXTENDED_RELEASE_TABLET | Freq: Once | ORAL | Status: AC
  Administered 2019-04-18: 20 meq via ORAL
  Filled 2019-04-18: qty 1

## 2019-04-18 NOTE — Progress Notes (Signed)
MD was notified about 18 bt run of Vtach orders for labs and potassium was order.

## 2019-04-18 NOTE — Progress Notes (Addendum)
PROGRESS NOTE    Christina Lyons  [id number] DOB: 01-15-1941 DOA: 04/16/2019 PCP: [name], MD    Brief Narrative:  79 year old lady with history of her hypertension prior history of rectocele, cystocele, GERD, hyperlipidemia, essential hypertension, presents to ED for generalized weakness and dizziness on arrival to ED she was tested positive for Covid.  She was also mildly hypoxic and was put on 2 L of nasal cannula oxygen to keep sats greater than 90% she was admitted to [facility] service for evaluation and management of COVID-19 infection. Patient seen and examined today denies any new complaints at this time.   Assessment & Plan:   Active Problems:   COVID-19   Acute respiratory failure with hypoxia secondary to COVID-19 viral pneumonia Chest x-ray shows Mild left greater than right vague peripheral opacity, suggesting possible atypical or viral pneumonia. Patient is currently requiring up to 2 L of nasal cannula oxygen to keep sats greater than 90% She is currently getting IV remdesivir and IV steroids. Recommend to use incentive spirometry and get out of bed and ambulate as tolerated. Symptomatic management with antiemetics and Tylenol. Follow inflammatory markers at this time. PT evaluation recommending home health PT at this time.  Essential hypertension Blood pressure parameters are well controlled.  Non sustained V TACH Pt remains asymptomatic.  Mag level is greater than 2 Potassium is 3.6.ordered 20 meq of k.   Dizziness Probably secondary to BPPV Vestibular evaluation done and her symptoms improved with canalith  repositioning maneuver. Home health PT will be ordered on discharge.  Hyperlipidemia Plan to restart statin on discharge.    DVT prophylaxis: Lovenox Code Status: DNR Family Communication: None at bedside Disposition Plan: Pending resolution of hypoxia.   Consultants:   None  Procedures: None  Antimicrobials:  None   Subjective: Patient appears to be in good spirits denies nausea today no shortness of breath or chest pain asymptomatic during.'s of nonsustained V. tach about 11 beats earlier today.  Patient denies any diarrhea or abdominal pain.  Objective: Vitals:   04/17/19 0759 04/17/19 1625 04/17/19 2035 04/18/19 0757  BP: (!) 142/77 (!) 143/80 129/74 138/83  Pulse: (!) 102 98 87 74  Resp: 16 16 18 18   Temp: 97.9 F (36.6 C) 97.8 F (36.6 C) 97.9 F (36.6 C) 97.9 F (36.6 C)  TempSrc: Oral Oral Oral Oral  SpO2: 95% 93% 95% 95%  Weight:      Height:        Intake/Output Summary (Last 24 hours) at 04/18/2019 1551 Last data filed at 04/18/2019 1007 Gross per 24 hour  Intake 420 ml  Output 700 ml  Net -280 ml   Filed Weights   04/17/19 0244  Weight: 94.1 kg    Examination:  General exam: Alert and comfortable on 2 L of nasal cannula oxygen. Respiratory system: Diminished air entry at bases, no wheezing or rhonchi Cardiovascular system: S1-S2 heard, regular rate rhythm, no JVD, no pedal edema. Gastrointestinal system: Abdomen is soft, nontender, nondistended bowel sounds normal Central nervous system: Alert and oriented and no focal deficits Extremities: No pedal edema, cyanosis or clubbing Skin: No rashes seen Psychiatry: Mood is appropriate   Data Reviewed: I have personally reviewed following labs and imaging studies  CBC: Recent Labs  Lab 04/16/19 1452 04/16/19 1821 04/17/19 0809 04/18/19 0545  WBC 5.4 4.8 2.7* 9.2  NEUTROABS 4.1 3.7 1.8 7.3  HGB 15.2* 14.4 14.1 13.7  HCT 46.4* 43.7 42.1 41.8  MCV 101.8* 101.6* 99.8 100.5*  PLT 191 164 185 [id number]   Basic Metabolic Panel: Recent Labs  Lab 04/16/19 1452 04/16/19 1822 04/17/19 0809 04/18/19 0545  NA 142  --  141  --   K 3.6  --  3.8  --   CL 99  --  103  --   CO2 29  --  25  --   GLUCOSE 102*  --  116*  --   BUN 11  --  12  --   CREATININE 0.74 0.61 0.52  --   CALCIUM 8.4*  --  7.6*  --   MG  --   --    --  2.2   GFR: Estimated Creatinine Clearance: 62.6 mL/min (by C-G formula based on SCr of 0.52 mg/dL). Liver Function Tests: Recent Labs  Lab 04/16/19 1452  AST 28  ALT 22  ALKPHOS 50  BILITOT 0.8  PROT 6.2*  ALBUMIN 3.1*   No results for input(s): LIPASE, AMYLASE in the last 168 hours. No results for input(s): AMMONIA in the last 168 hours. Coagulation Profile: No results for input(s): INR, PROTIME in the last 168 hours. Cardiac Enzymes: No results for input(s): CKTOTAL, CKMB, CKMBINDEX, TROPONINI in the last 168 hours. BNP (last 3 results) No results for input(s): PROBNP in the last 8760 hours. HbA1C: No results for input(s): HGBA1C in the last 72 hours. CBG: No results for input(s): GLUCAP in the last 168 hours. Lipid Profile: Recent Labs    04/16/19 1630  TRIG 76   Thyroid Function Tests: No results for input(s): TSH, T4TOTAL, FREET4, T3FREE, THYROIDAB in the last 72 hours. Anemia Panel: Recent Labs    04/17/19 0809 04/18/19 0545  FERRITIN 816* 892*   Sepsis Labs: Recent Labs  Lab 04/16/19 1459 04/16/19 1630  PROCALCITON  --  <0.10  LATICACIDVEN 1.4  --     Recent Results (from the past 240 hour(s))  Culture, blood (routine x 2)     Status: None (Preliminary result)   Collection Time: 04/16/19  2:58 PM   Specimen: BLOOD  Result Value Ref Range Status   Specimen Description BLOOD LEFT ANTECUBITAL  Final   Special Requests   Final    BOTTLES DRAWN AEROBIC AND ANAEROBIC Blood Culture adequate volume   Culture   Final    NO GROWTH 2 DAYS Performed at [facility], [place] [address], [place], [place] [address]    Report Status PENDING  Incomplete  Culture, blood (routine x 2)     Status: None (Preliminary result)   Collection Time: 04/16/19  3:55 PM   Specimen: BLOOD RIGHT HAND  Result Value Ref Range Status   Specimen Description BLOOD RIGHT HAND  Final   Special Requests   Final    BOTTLES DRAWN AEROBIC AND ANAEROBIC Blood Culture results  may not be optimal due to an inadequate volume of blood received in culture bottles   Culture   Final    NO GROWTH 2 DAYS Performed at [facility], [place] [address] [place], [place] [address]    Report Status PENDING  Incomplete         Radiology Studies: DG Chest Portable 1 View  Result Date: 04/16/2019 CLINICAL DATA:  Cough EXAM: PORTABLE CHEST 1 VIEW COMPARISON:  07/04/2016 FINDINGS: Vague foci of left greater than right peripheral opacity. No pleural effusion. Normal heart size. No pneumothorax. IMPRESSION: Mild left greater than right vague peripheral opacity, suggesting possible atypical or viral pneumonia. Electronically Signed   By: Maudie Mercury  Francoise Ceo M.D.   On: 04/16/2019 16:58        Scheduled Meds: . aspirin EC  81 mg Oral Daily  . dexamethasone (DECADRON) injection  6 mg Intravenous Q24H  . enoxaparin (LOVENOX) injection  40 mg Subcutaneous Q24H  . gabapentin  300 mg Oral QID  . ondansetron (ZOFRAN) IV  4 mg Intravenous Q6H  . pantoprazole  40 mg Oral Daily  . potassium chloride  20 mEq Oral Once  . sodium chloride flush  3 mL Intravenous Q12H   Continuous Infusions: . remdesivir 100 mg in NS 100 mL 100 mg (04/18/19 1257)     LOS: 2 days        Hosie Poisson, MD [facility]  04/18/2019, 3:51 PM

## 2019-04-19 LAB — CBC WITH DIFFERENTIAL/PLATELET
Abs Immature Granulocytes: 0.14 10*3/uL — ABNORMAL HIGH (ref 0.00–0.07)
Basophils Absolute: 0 10*3/uL (ref 0.0–0.1)
Basophils Relative: 0 %
Eosinophils Absolute: 0 10*3/uL (ref 0.0–0.5)
Eosinophils Relative: 0 %
HCT: 40.9 % (ref 36.0–46.0)
Hemoglobin: 13.3 g/dL (ref 12.0–15.0)
Immature Granulocytes: 2 %
Lymphocytes Relative: 13 %
Lymphs Abs: 1 10*3/uL (ref 0.7–4.0)
MCH: 32.6 pg (ref 26.0–34.0)
MCHC: 32.5 g/dL (ref 30.0–36.0)
MCV: 100.2 fL — ABNORMAL HIGH (ref 80.0–100.0)
Monocytes Absolute: 0.5 10*3/uL (ref 0.1–1.0)
Monocytes Relative: 7 %
Neutro Abs: 5.9 10*3/uL (ref 1.7–7.7)
Neutrophils Relative %: 78 %
Platelets: 220 10*3/uL (ref 150–400)
RBC: 4.08 MIL/uL (ref 3.87–5.11)
RDW: 13.1 % (ref 11.5–15.5)
WBC: 7.6 10*3/uL (ref 4.0–10.5)
nRBC: 0 % (ref 0.0–0.2)

## 2019-04-19 LAB — BASIC METABOLIC PANEL
Anion gap: 9 (ref 5–15)
BUN: 24 mg/dL — ABNORMAL HIGH (ref 8–23)
CO2: 28 mmol/L (ref 22–32)
Calcium: 7.4 mg/dL — ABNORMAL LOW (ref 8.9–10.3)
Chloride: 104 mmol/L (ref 98–111)
Creatinine, Ser: 0.68 mg/dL (ref 0.44–1.00)
GFR calc Af Amer: >60 mL/min (ref >60)
GFR calc non Af Amer: >60 mL/min (ref >60)
Glucose, Bld: 125 mg/dL — ABNORMAL HIGH (ref 70–99)
Potassium: 3.9 mmol/L (ref 3.5–5.1)
Sodium: 141 mmol/L (ref 135–145)

## 2019-04-19 LAB — FERRITIN: Ferritin: 796 ng/mL — ABNORMAL HIGH (ref 11–307)

## 2019-04-19 MED ORDER — ENOXAPARIN SODIUM 60 MG/0.6ML ~~LOC~~ SOLN
0.5000 mg/kg | SUBCUTANEOUS | Status: DC
  Administered 2019-04-19: 45 mg via SUBCUTANEOUS
  Filled 2019-04-19: qty 0.6

## 2019-04-19 NOTE — Progress Notes (Signed)
SATURATION QUALIFICATIONS: (This note is used to comply with regulatory documentation for home oxygen)  Patient Saturations on Room Air at Rest = 94%  Patient Saturations on Room Air while Ambulating = 88%  Patient Saturations on 1 Liters of oxygen while Ambulating = 93%  Please briefly explain why patient needs home oxygen:Pt needs 1LO2 with activity to keep sats >88%.  Encouraged incentive spirometer.  Thanks.  Nyjah Denio W,PT Acute Rehabilitation Services Pager:  [phone]  Office:  [phone]

## 2019-04-19 NOTE — Progress Notes (Signed)
PROGRESS NOTE    Christina Lyons  [id number] DOB: 1940-05-27 DOA: 04/16/2019 PCP: [name], MD    Brief Narrative:  79 year old lady with history of her hypertension prior history of rectocele, cystocele, GERD, hyperlipidemia, essential hypertension, presents to ED for generalized weakness and dizziness on arrival to ED she was tested positive for Covid.  She was also mildly hypoxic and was put on 2 L of nasal cannula oxygen to keep sats greater than 90% she was admitted to [facility] service for evaluation and management of COVID-19 infection. Patient reports her dizziness has improved and and reports her breathing has improved she denies any chest pain, nausea, vomiting or abdominal pain   Assessment & Plan:   Active Problems:   COVID-19   Acute respiratory failure with hypoxia secondary to COVID-19 viral pneumonia Chest x-ray shows Mild left greater than right vague peripheral opacity, suggesting possible atypical or viral pneumonia. Patient reports her breathing has improved and we were able to wean her oxygen from 2 L/min to 1 L/min today and her sats have been around 94%. Patient is on day IV remdesivir 4/5, and on day 4/10 of IV Decadron 6 mg daily. Recommend to use incentive spirometry and get out of bed and ambulate as tolerated. CRP at 4.3, ferritin 796 follow inflammatory markers PT evaluation recommending home health PT at this time.  Essential hypertension Well-controlled blood pressures.  Non sustained V TACH Pt remains asymptomatic.  Keep magnesium greater than 2, potassium greater than 4  Dizziness Probably secondary to BPPV Vestibular evaluation done and her symptoms improved with canalith  repositioning maneuver. Home health PT will be ordered on discharge.  Hyperlipidemia Plan to restart statin on discharge.    DVT prophylaxis: Lovenox Code Status: DNR Family Communication: None at bedside, called sister and left voicemail Disposition Plan:  Pending resolution of hypoxia.   Consultants:   None  Procedures: None  Antimicrobials: None   Subjective: Patient appears to be in good spirits today, denies any nausea vomiting abdominal pain or diarrhea.  She reports her coughing and breathing has improved.  Objective: Vitals:   04/18/19 0757 04/18/19 1500 04/18/19 2124 04/19/19 0822  BP: 138/83 135/82 115/77 (!) 151/88  Pulse: 74 82 78   Resp: 18  16 18   Temp: 97.9 F (36.6 C) 98.2 F (36.8 C) 98.5 F (36.9 C) 97.6 F (36.4 C)  TempSrc: Oral Oral Oral Oral  SpO2: 95% 98% 94%   Weight:      Height:        Intake/Output Summary (Last 24 hours) at 04/19/2019 1524 Last data filed at 04/19/2019 0400 Gross per 24 hour  Intake -  Output 1 ml  Net -1 ml   Filed Weights   04/17/19 0244  Weight: 94.1 kg    Examination:  General exam: Alert, not in any kind of distress.  Still requiring 1 L of nasal cannula oxygen to keep sats greater than 90% Respiratory system: Air entry fair at bases, slight tachypnea present, no rhonchi Cardiovascular system: S1-S2 heard, regular rate rhythm, no JVD, no pedal edema Gastrointestinal system: Abdomen is soft, nontender, nondistended, bowel sounds normal Central nervous system: Patient is alert and oriented, no focal deficits. Extremities: No pedal edema, Skin: No rashes seen Psychiatry: Mood is appropriate   Data Reviewed: I have personally reviewed following labs and imaging studies  CBC: Recent Labs  Lab 04/16/19 1452 04/16/19 1821 04/17/19 0809 04/18/19 0545 04/19/19 0355  WBC 5.4 4.8 2.7* 9.2 7.6  NEUTROABS  4.1 3.7 1.8 7.3 5.9  HGB 15.2* 14.4 14.1 13.7 13.3  HCT 46.4* 43.7 42.1 41.8 40.9  MCV 101.8* 101.6* 99.8 100.5* 100.2*  PLT 191 164 185 228 [id number]   Basic Metabolic Panel: Recent Labs  Lab 04/16/19 1452 04/16/19 1822 04/17/19 0809 04/18/19 0545 04/19/19 0355  NA 142  --  141  --  141  K 3.6  --  3.8  --  3.9  CL 99  --  103  --  104  CO2 29  --  25  --   28  GLUCOSE 102*  --  116*  --  125*  BUN 11  --  12  --  24*  CREATININE 0.74 0.61 0.52  --  0.68  CALCIUM 8.4*  --  7.6*  --  7.4*  MG  --   --   --  2.2  --    GFR: Estimated Creatinine Clearance: 62.6 mL/min (by C-G formula based on SCr of 0.68 mg/dL). Liver Function Tests: Recent Labs  Lab 04/16/19 1452  AST 28  ALT 22  ALKPHOS 50  BILITOT 0.8  PROT 6.2*  ALBUMIN 3.1*   No results for input(s): LIPASE, AMYLASE in the last 168 hours. No results for input(s): AMMONIA in the last 168 hours. Coagulation Profile: No results for input(s): INR, PROTIME in the last 168 hours. Cardiac Enzymes: No results for input(s): CKTOTAL, CKMB, CKMBINDEX, TROPONINI in the last 168 hours. BNP (last 3 results) No results for input(s): PROBNP in the last 8760 hours. HbA1C: No results for input(s): HGBA1C in the last 72 hours. CBG: No results for input(s): GLUCAP in the last 168 hours. Lipid Profile: Recent Labs    04/16/19 1630  TRIG 76   Thyroid Function Tests: No results for input(s): TSH, T4TOTAL, FREET4, T3FREE, THYROIDAB in the last 72 hours. Anemia Panel: Recent Labs    04/18/19 0545 04/19/19 0355  FERRITIN 892* 796*   Sepsis Labs: Recent Labs  Lab 04/16/19 1459 04/16/19 1630  PROCALCITON  --  <0.10  LATICACIDVEN 1.4  --     Recent Results (from the past 240 hour(s))  Culture, blood (routine x 2)     Status: None (Preliminary result)   Collection Time: 04/16/19  2:58 PM   Specimen: BLOOD  Result Value Ref Range Status   Specimen Description BLOOD LEFT ANTECUBITAL  Final   Special Requests   Final    BOTTLES DRAWN AEROBIC AND ANAEROBIC Blood Culture adequate volume   Culture   Final    NO GROWTH 3 DAYS Performed at [facility], [place] [address], [place], [place] [address]    Report Status PENDING  Incomplete  Culture, blood (routine x 2)     Status: None (Preliminary result)   Collection Time: 04/16/19  3:55 PM   Specimen: BLOOD RIGHT HAND  Result Value  Ref Range Status   Specimen Description BLOOD RIGHT HAND  Final   Special Requests   Final    BOTTLES DRAWN AEROBIC AND ANAEROBIC Blood Culture results may not be optimal due to an inadequate volume of blood received in culture bottles   Culture   Final    NO GROWTH 3 DAYS Performed at [facility], [place] [address], [place], [place] [address]    Report Status PENDING  Incomplete         Radiology Studies: No results found.      Scheduled Meds: . aspirin EC  81 mg Oral Daily  .  dexamethasone (DECADRON) injection  6 mg Intravenous Q24H  . enoxaparin (LOVENOX) injection  0.5 mg/kg Subcutaneous Q24H  . gabapentin  300 mg Oral QID  . ondansetron (ZOFRAN) IV  4 mg Intravenous Q6H  . pantoprazole  40 mg Oral Daily  . sodium chloride flush  3 mL Intravenous Q12H   Continuous Infusions: . remdesivir 100 mg in NS 100 mL 100 mg (04/19/19 1043)     LOS: 3 days        Hosie Poisson, MD [facility]  04/19/2019, 3:24 PM

## 2019-04-19 NOTE — Progress Notes (Addendum)
Physical Therapy Treatment Patient Details Name: Christina Lyons MRN: [id number] DOB: 04/11/1940 Today's Date: 04/19/2019    History of Present Illness 79 year old Christina Lyons female known history of chronic sinusitis with seasonal variation HTN,, prior rectocele cystocele status post repair initially presented 04/11/2019 dizziness feeling ill to emergency room.  Pt  with Covid as well.     PT Comments    Pt admitted with above diagnosis. Pt was able to ambulate with RW in room with min guard assist with good stability. Pt still desaturating with activity. See below. Vertigo resolved per pt after the treatment with PT the other day.  Pt feels good going home with her fiancee.  Will need HHPT f/u.  Progressing well.   Pt currently with functional limitations due to the deficits listed below (see PT Problem List). Pt will benefit from skilled PT to increase their independence and safety with mobility to allow discharge to the venue listed below.    SATURATION QUALIFICATIONS: (This note is used to comply with regulatory documentation for home oxygen)  Patient Saturations on Room Air at Rest = 94%  Patient Saturations on Room Air while Ambulating = 88%  Patient Saturations on 1 Liters of oxygen while Ambulating = 93%  Please briefly explain why patient needs home oxygen:Pt needs 1LO2 with activity to keep sats >88%.  Encouraged incentive spirometer.    Follow Up Recommendations  Home health PT;Supervision/Assistance - 24 hour     Equipment Recommendations  None recommended by PT    Recommendations for Other Services       Precautions / Restrictions Precautions Precautions: Fall Restrictions Weight Bearing Restrictions: No    Mobility  Bed Mobility Overal bed mobility: Needs Assistance Bed Mobility: Rolling;Sidelying to Sit Rolling: Supervision Sidelying to sit: Supervision       General bed mobility comments: No dizziness per pt. No assist to come to EOB.   Transfers Overall  transfer level: Needs assistance Equipment used: Rolling walker (2 wheeled) Transfers: Sit to/from [facility] Sit to Stand: Min guard         General transfer comment: Able to stand with min guard assist only.    Ambulation/Gait Ambulation/Gait assistance: Min guard Gait Distance (Feet): 35 Feet Assistive device: Rolling walker (2 wheeled) Gait Pattern/deviations: Step-through pattern;Decreased stride length   Gait velocity interpretation: <1.31 ft/sec, indicative of household ambulator General Gait Details: Pt was able to ambulate to door and back to chair.  No dizziness noted. No LOB as well.     Stairs             Wheelchair Mobility    Modified Rankin (Stroke Patients Only)       Balance Overall balance assessment: Needs assistance Sitting-balance support: No upper extremity supported;Feet supported Sitting balance-Leahy Scale: Fair     Standing balance support: During functional activity;Bilateral upper extremity supported Standing balance-Leahy Scale: Poor Standing balance comment: can stand statically with min guard assist, dynamic activity with challenges with min assist.                             Cognition Arousal/Alertness: Awake/alert Behavior During Therapy: WFL for tasks assessed/performed Overall Cognitive Status: Within Functional Limits for tasks assessed                                        Exercises  General Comments General comments (skin integrity, edema, etc.): Sats 88% on RA at end of walk. PT still needs at least 1LO2 to keep sats >88% with activity. Gave incentive spirometer and encouraged use.       Pertinent Vitals/Pain Pain Assessment: No/denies pain    Home Living                      Prior Function            PT Goals (current goals can now be found in the care plan section) Acute Rehab PT Goals Patient Stated Goal: to go home Progress towards PT goals:  Progressing toward goals    Frequency    Min 3X/week      PT Plan Current plan remains appropriate    Co-evaluation              AM-PAC PT "6 Clicks" Mobility   Outcome Measure  Help needed turning from your back to your side while in a flat bed without using bedrails?: None Help needed moving from lying on your back to sitting on the side of a flat bed without using bedrails?: None Help needed moving to and from a bed to a chair (including a wheelchair)?: None Help needed standing up from a chair using your arms (e.g., wheelchair or bedside chair)?: A Little Help needed to walk in hospital room?: A Little Help needed climbing 3-5 steps with a railing? : A Little 6 Click Score: 21    End of Session Equipment Utilized During Treatment: Gait belt;Oxygen Activity Tolerance: Patient limited by fatigue Patient left: in chair;with call bell/phone within reach Nurse Communication: Mobility status PT Visit Diagnosis: Unsteadiness on feet (R26.81);Muscle weakness (generalized) (M62.81)     Time: [id number] PT Time Calculation (min) (ACUTE ONLY): 25 min  Charges:  $Gait Training: 23-37 mins                     Virginia Curl W,PT [place] Pager:  [phone]  Office:  [place] 04/19/2019, 1:46 PM

## 2019-04-20 LAB — CBC WITH DIFFERENTIAL/PLATELET
Abs Immature Granulocytes: 0.1 10*3/uL — ABNORMAL HIGH (ref 0.00–0.07)
Basophils Absolute: 0 10*3/uL (ref 0.0–0.1)
Basophils Relative: 0 %
Eosinophils Absolute: 0 10*3/uL (ref 0.0–0.5)
Eosinophils Relative: 0 %
HCT: 40.5 % (ref 36.0–46.0)
Hemoglobin: 13.2 g/dL (ref 12.0–15.0)
Lymphocytes Relative: 7 %
Lymphs Abs: 0.5 10*3/uL — ABNORMAL LOW (ref 0.7–4.0)
MCH: 32.7 pg (ref 26.0–34.0)
MCHC: 32.6 g/dL (ref 30.0–36.0)
MCV: 100.2 fL — ABNORMAL HIGH (ref 80.0–100.0)
Monocytes Absolute: 0.3 10*3/uL (ref 0.1–1.0)
Monocytes Relative: 4 %
Myelocytes: 1 %
Neutro Abs: 6.9 10*3/uL (ref 1.7–7.7)
Neutrophils Relative %: 88 %
Platelets: 231 10*3/uL (ref 150–400)
RBC: 4.04 MIL/uL (ref 3.87–5.11)
RDW: 13 % (ref 11.5–15.5)
WBC: 7.8 10*3/uL (ref 4.0–10.5)
nRBC: 0 % (ref 0.0–0.2)
nRBC: 0 /100 WBC

## 2019-04-20 LAB — C-REACTIVE PROTEIN: CRP: 0.5 mg/dL (ref <1.0)

## 2019-04-20 LAB — FERRITIN: Ferritin: 835 ng/mL — ABNORMAL HIGH (ref 11–307)

## 2019-04-20 MED ORDER — ROSUVASTATIN CALCIUM 5 MG PO TABS: 5.0000 mg | ORAL_TABLET | Freq: Every day | ORAL | Status: DC

## 2019-04-20 MED ORDER — DEXAMETHASONE 6 MG PO TABS: 6.0000 mg | ORAL_TABLET | Freq: Every day | ORAL | 0 refills | Status: DC

## 2019-04-20 NOTE — Progress Notes (Signed)
Physical Therapy Treatment Patient Details Name: Christina Lyons MRN: [id number] DOB: May 14, 1940 Today's Date: 04/20/2019    History of Present Illness 79 year old white female known history of chronic sinusitis with seasonal variation HTN,, prior rectocele cystocele status post repair initially presented 04/11/2019 dizziness feeling ill to emergency room.  Pt  with Covid as well.     PT Comments    Patient presents with mobility still limited with dizziness.  Noted some continued nystagmus in L Dix hallpike position and treated, though not truly rotary nystagmus.   Feel she should be safe for home with intermittent family support and follow up HHPT.  PT to follow acutely.   Follow Up Recommendations  Home health PT;Supervision/Assistance - 24 hour     Equipment Recommendations  None recommended by PT    Recommendations for Other Services       Precautions / Restrictions Precautions Precautions: Fall    Mobility  Bed Mobility   Bed Mobility: Supine to Sit     Supine to sit: Supervision     General bed mobility comments: mild symptoms supine to sit, but reports hasn't really been up today  Transfers Overall transfer level: Needs assistance Equipment used: Rolling walker (2 wheeled) Transfers: Sit to/from Stand Sit to Stand: Supervision;Min guard         General transfer comment: minguard initially, then S next attempt  Ambulation/Gait Ambulation/Gait assistance: Min guard;Supervision Gait Distance (Feet): 35 Feet(x 2) Assistive device: Rolling walker (2 wheeled) Gait Pattern/deviations: Step-through pattern;Decreased stride length     General Gait Details: mild symptoms and pt looking down so cues for looking ahead and targeting stationary target while turning.   Stairs             Wheelchair Mobility    Modified Rankin (Stroke Patients Only)       Balance Overall balance assessment: Needs assistance   Sitting balance-Leahy Scale: Good       Standing balance-Leahy Scale: Poor Standing balance comment: UE assist for balance                            Cognition Arousal/Alertness: Awake/alert Behavior During Therapy: WFL for tasks assessed/performed Overall Cognitive Status: Within Functional Limits for tasks assessed                                        Exercises      General Comments General comments (skin integrity, edema, etc.): SpO2 91% ambulating on RA; Performed modified hall pike to L and pt positive for symptoms and some noted nystagmus; then performed hall pike with noted down beat nystagmus low amplitude and symptoms lasting about 45 sec, performed Eply maneuver x 1 for suspected L post canal BPPV.  Patient also reported some difficulty with seeing blurry or double out of R periphery, better with glasses on      Pertinent Vitals/Pain Pain Assessment: No/denies pain    Home Living                      Prior Function            PT Goals (current goals can now be found in the care plan section) Progress towards PT goals: Progressing toward goals    Frequency    Min 3X/week      PT Plan Current plan remains appropriate  Co-evaluation              AM-PAC PT "6 Clicks" Mobility   Outcome Measure  Help needed turning from your back to your side while in a flat bed without using bedrails?: None Help needed moving from lying on your back to sitting on the side of a flat bed without using bedrails?: None Help needed moving to and from a bed to a chair (including a wheelchair)?: None Help needed standing up from a chair using your arms (e.g., wheelchair or bedside chair)?: A Little Help needed to walk in hospital room?: A Little Help needed climbing 3-5 steps with a railing? : A Little 6 Click Score: 21    End of Session   Activity Tolerance: Patient tolerated treatment well Patient left: in chair;with call bell/phone within reach   PT Visit Diagnosis:  Muscle weakness (generalized) (M62.81);Unsteadiness on feet (R26.81);Dizziness and giddiness (R42)     Time: [id number] PT Time Calculation (min) (ACUTE ONLY): 34 min  Charges:  $Gait Training: 8-22 mins $Canalith Rep Proc: 8-22 mins                     Christina Lyons, [place] [phone] 04/20/2019    Christina Lyons 04/20/2019, 2:17 PM

## 2019-04-20 NOTE — TOC Initial Note (Signed)
Transition of Care [facility]) - Initial/Assessment Note    Patient Details  Name: Christina Lyons MRN: [id number] Date of Birth: 1940/09/11  Transition of Care [facility]) CM/SW Contact:    Bartholomew Crews, RN Phone Number:  [phone] 04/20/2019, 4:02 PM  Clinical Narrative:                 Spoke with patient on the phone several times this afternoon. PTA patient home with fiance who also has covid at this time.   PCP updated in Epic. Advised patient to schedule a follow up appointment.   Discussed need for oxygen. Referral accepted by [facility] and will deliver to the room. Patient initially wanted assistance with transport, but d/t covid status, the only option was ambulance transport. Patient was able to find someone who could pick her up and transport home and delivery of oxygen to the home was diverted to her hospital room.   Discussed home health orders for RN, PT, Aide. Patient uncertain that she needed these but was agreeable. However, NCM was unable to find an accepting [facility] agency after calling [facility], [facility], [facility], and [facility] - all declined to accept. Patient is agreeable to no home health at this time, and verbalized understanding to call her PCP for follow up. She says that she has help at home.  Attending was made aware of no home health.   Patient did agree to [facility] services for community case management. Will place referral.   No further TOC needs identified at this time.   Expected Discharge Plan: Home/Self Care Barriers to Discharge: No Barriers Identified   Patient Goals and CMS Choice Patient states their goals for this hospitalization and ongoing recovery are:: return home CMS Medicare.gov Compare Post Acute Care list provided to:: Patient Choice offered to / list presented to : Patient  Expected Discharge Plan and Services Expected Discharge Plan: Home/Self Care In-house Referral: [facility] Discharge Planning Services: CM Consult Post Acute Care Choice: Home  Health, Durable Medical Equipment Living arrangements for the past 2 months: Single Family Home Expected Discharge Date: 04/20/19               DME Arranged: Oxygen DME Agency: [facility] Date DME Agency Contacted: 04/20/19 Time DME Agency Contacted: 33 Representative spoke with at DME Agency: [place]: NA [place] Agency: NA        Prior Living Arrangements/Services Living arrangements for the past 2 months: [place] with:: Self, Significant Other Patient language and need for interpreter reviewed:: Yes Do you feel safe going back to the place where you live?: Yes      Need for Family Participation in Patient Care: Yes (Comment) Care giver support system in place?: Yes (comment)   Criminal Activity/Legal Involvement Pertinent to Current Situation/Hospitalization: No - Comment as needed  Activities of Daily Living Home Assistive Devices/Equipment: None ADL Screening (condition at time of admission) Patient's cognitive ability adequate to safely complete daily activities?: Yes Is the patient deaf or have difficulty hearing?: No Does the patient have difficulty seeing, even when wearing glasses/contacts?: No Does the patient have difficulty concentrating, remembering, or making decisions?: No Patient able to express need for assistance with ADLs?: Yes Does the patient have difficulty dressing or bathing?: No Independently performs ADLs?: Yes (appropriate for developmental age) Does the patient have difficulty walking or climbing stairs?: No Weakness of Legs: None Weakness of Arms/Hands: None  Permission Sought/Granted  Emotional Assessment Appearance:: Appears stated age Attitude/Demeanor/Rapport: Engaged Affect (typically observed): Accepting Orientation: : Oriented to Self, Oriented to  Time, Oriented to Place, Oriented to Situation Alcohol / Substance Use: Not Applicable Psych Involvement: No (comment)  Admission diagnosis:   COVID-19 virus infection [U07.1] COVID-19 [U07.1] Patient Active Problem List   Diagnosis Date Noted  . COVID-19 04/16/2019  . Cough 10/06/2015  . S/P total knee arthroplasty 01/07/2013  . Lymphadenopathy 12/14/2012  . Sinusitis, acute 12/14/2012  . Hyperlipidemia   . Hypertension   . Other idiopathic scoliosis, thoracolumbar region   . Osteoporosis   . DDD (degenerative disc disease), lumbar    PCP:  Simona Huh, NP Pharmacy:   [id number] - [place], [place] - [address] [place] [address] Phone: [phone] Fax: [phone]     Social Determinants of Health (SDOH) Interventions    Readmission Risk Interventions No flowsheet data found.

## 2019-04-21 LAB — CULTURE, BLOOD (ROUTINE X 2)
Culture: NO GROWTH
Culture: NO GROWTH
Special Requests: ADEQUATE

## 2019-04-26 ENCOUNTER — Telehealth: Payer: Self-pay | Admitting: Nurse Practitioner

## 2019-04-26 NOTE — Telephone Encounter (Signed)
Patient just got out of the hospital from covid , says that she is having issues with having to urinate all the time now would like to ask about a certain medication that may help her  [phone]

## 2019-04-27 MED ORDER — OXYBUTYNIN CHLORIDE 5 MG PO TABS: 5.0000 mg | ORAL_TABLET | Freq: Two times a day (BID) | ORAL | 1 refills | Status: DC

## 2019-04-27 NOTE — Telephone Encounter (Signed)
Call placed to patient to inquire.   Reports that she was diagnosed with COVID x11 days prior and has been released from hospital x6 days prior. States that she has increased urinary frequency and is having incontinent episodes. States that she has no burning with urination, no pain, no discharge.   States that [facility] did prescribe medication for OAB, but co-pay is >$200. Reports that pharmacist recommended Oxybutynin.  MD please advise.

## 2019-04-27 NOTE — Telephone Encounter (Signed)
Difficult to answer without a urine sample.  Sounds more like overactive bladder.  I would try the oxybutynin 5 mg twice a day.  If symptoms or not improving we will need to check a urine sample to rule out urinary tract infection.

## 2019-04-27 NOTE — Discharge Summary (Signed)
Physician Discharge Summary  ABIE GROH [id number] DOB: 20-Jan-1941 DOA: 04/16/2019  PCP: [name], MD  Admit date: 04/16/2019 Discharge date: 1/19 /2021  Admitted From: Home. Disposition:  Home.   Recommendations for Outpatient Follow-up:  1. Follow up with PCP in 1-2 weeks 2. Please obtain BMP/CBC in one week 3. Home health with 1 lit of [place] oxygen.    Discharge Condition:STABLE.  CODE STATUS:DNR Diet recommendation: Heart Healthy   Brief/Interim Summary: 79 year old lady with history of her hypertension prior history of rectocele, cystocele, GERD, hyperlipidemia, essential hypertension, presents to ED for generalized weakness and dizziness on arrival to ED she was tested positive for Covid.  She was also mildly hypoxic and was put on 2 L of nasal cannula oxygen to keep sats greater than 90% she was admitted to [facility] service for evaluation and management of COVID-19 infection  Discharge Diagnoses:  Active Problems:   COVID-19  Acute respiratory failure with hypoxia secondary to COVID-19 viral pneumonia Chest x-ray shows Mild left greater than right vague peripheral opacity, suggesting possible atypical or viral pneumonia. Patient reports her breathing has improved and we were able to wean her oxygen from 2 L/min to 1 L/min today and her sats have been around 94%. Patient  Completed remdesivir and on Decadron 6 mg daily on discharge for the completion of the course.  Recommend to use incentive spirometry and get out of bed and ambulate as tolerated. PT evaluation recommending home health PT at this time.  Essential hypertension Well-controlled blood pressures.  Non sustained V TACH Pt remains asymptomatic.  Keep magnesium greater than 2, potassium greater than 4  Dizziness Probably secondary to BPPV Vestibular evaluation done and her symptoms improved with canalith  repositioning maneuver. Home health PT will be ordered on  discharge.  Hyperlipidemia restart statin on discharge.    Discharge Instructions  Discharge Instructions    MyChart COVID-19 home monitoring program   Complete by: Apr 20, 2019    Is the patient willing to use the [place] for home monitoring?: Yes   Temperature monitoring   Complete by: Apr 20, 2019    After how many days would you like to receive a notification of this patient's flowsheet entries?: 1   Diet - low sodium heart healthy   Complete by: As directed    Discharge instructions   Complete by: As directed    Please follow up with PCP in 2 to 4 weeks.     Allergies as of 04/20/2019      Reactions   Sulfa Antibiotics Hives      Medication List    STOP taking these medications   hydrochlorothiazide 25 MG tablet Commonly known as: HYDRODIURIL   meclizine 12.5 MG tablet Commonly known as: ANTIVERT   meclizine 25 MG tablet Commonly known as: ANTIVERT   ondansetron 4 MG disintegrating tablet Commonly known as: Zofran ODT     TAKE these medications   albuterol 108 (90 Base) MCG/ACT inhaler Commonly known as: VENTOLIN HFA Inhale 2 puffs into the lungs every 6 (six) hours as needed for wheezing or shortness of breath.   aspirin EC 81 MG tablet Take 81 mg by mouth at bedtime. Notes to patient: 04/21/2019   b complex vitamins tablet Take 1 tablet by mouth daily with supper.   cetirizine 10 MG tablet Commonly known as: ZYRTEC Take 10 mg by mouth at bedtime.   cholecalciferol 1000 units tablet Commonly known as: VITAMIN D Take 1,000 Units by mouth  daily with supper.   dexamethasone 6 MG tablet Commonly known as: DECADRON Take 1 tablet (6 mg total) by mouth daily. Notes to patient: 04/21/2019    fluticasone 50 MCG/ACT nasal spray Commonly known as: FLONASE Place 1 spray into both nostrils every 12 (twelve) hours as needed for allergies.   gabapentin 100 MG capsule Commonly known as: NEURONTIN Take 200 mg by mouth 2 (two) times  daily. Notes to patient: 04/21/2019 PM   HYDROcodone-acetaminophen 5-325 MG tablet Commonly known as: NORCO/VICODIN Take 1 tablet by mouth every 6 (six) hours as needed. Chronic Pain. Dx: G89.4 What changed: reasons to take this   LORazepam 1 MG tablet Commonly known as: ATIVAN TAKE 1 TABLET BY MOUTH TWICE A DAY AS NEEDED FOR ANXIETY What changed: See the new instructions.   losartan 50 MG tablet Commonly known as: COZAAR TAKE 1 TABLET BY MOUTH EVERY DAY. What changed:   how much to take  how to take this  when to take this  additional instructions   multivitamin with minerals Tabs tablet Take 1 tablet by mouth daily with supper.   omeprazole 20 MG capsule Commonly known as: PRILOSEC TAKE 1 CAPSULE BY MOUTH TWICE A DAY What changed: when to take this   promethazine 25 MG tablet Commonly known as: PHENERGAN TAKE 1 TABLET BY MOUTH EVERY 8 HOURS AS NEEDED FOR NAUSEA AND VOMITING What changed: See the new instructions.   rosuvastatin 5 MG tablet Commonly known as: CRESTOR Take 1 tablet (5 mg total) by mouth at bedtime.   VITAMIN C PO Take 1 tablet by mouth daily with supper.   ZINC PO Take 1 tablet by mouth daily with supper.      Follow-up Information    [name], MD. Schedule an appointment as soon as possible for a visit in 2 week(s).   Specialty: Family Medicine Contact information: [id number] [place] [address] [phone]          Allergies  Allergen Reactions  . Sulfa Antibiotics Hives    Consultations:  None.    Procedures/Studies: MR BRAIN WO CONTRAST  Result Date: 04/11/2019 CLINICAL DATA:  Vertigo EXAM: MRI HEAD WITHOUT CONTRAST TECHNIQUE: Multiplanar, multiecho pulse sequences of the brain and surrounding structures were obtained without intravenous contrast. COMPARISON:  06/22/2017 FINDINGS: Brain: There is no acute infarction or intracranial hemorrhage. There is no intracranial mass, mass effect, or edema.  There is no hydrocephalus or extra-axial fluid collection. Patchy and confluent areas of T2 hyperintensity in the supratentorial much greater than pontine white matter are nonspecific but may reflect mild to moderate chronic microvascular ischemic changes. Incidental note is made of a right parietooccipital developmental venous anomaly. Vascular: Major vessel flow voids at the skull base are preserved. Skull and upper cervical spine: Normal marrow signal is preserved. Sinuses/Orbits: Paranasal sinuses are aerated. Bilateral lens replacements. Other: Sella is unremarkable.  Mastoid air cells are clear. IMPRESSION: No evidence of acute infarction, hemorrhage, or mass. Stable chronic microvascular ischemic changes. Electronically Signed   By: Macy Mis M.D.   On: 04/11/2019 15:44   DG Chest Portable 1 View  Result Date: 04/16/2019 CLINICAL DATA:  Cough EXAM: PORTABLE CHEST 1 VIEW COMPARISON:  07/04/2016 FINDINGS: Vague foci of left greater than right peripheral opacity. No pleural effusion. Normal heart size. No pneumothorax. IMPRESSION: Mild left greater than right vague peripheral opacity, suggesting possible atypical or viral pneumonia. Electronically Signed   By: Donavan Foil M.D.   On: 04/16/2019 16:58  Subjective: No chest pain or sob, no nausea, vomiting, diarrhea or abdominal pain or dysuria.   Discharge Exam: Vitals:   04/20/19 0754 04/20/19 1541  BP: (!) 141/65 (!) 143/78  Pulse: 63 95  Resp: 16 16  Temp: 98 F (36.7 C) 98 F (36.7 C)  SpO2: 93% 94%   Vitals:   04/19/19 1552 04/19/19 2319 04/20/19 0754 04/20/19 1541  BP: 134/84 118/69 (!) 141/65 (!) 143/78  Pulse: 76 67 63 95  Resp: 16  16 16   Temp: (!) 97.5 F (36.4 C) 98.1 F (36.7 C) 98 F (36.7 C) 98 F (36.7 C)  TempSrc: Oral Oral Oral Oral  SpO2: 92% 92% 93% 94%  Weight:      Height:        General: Pt is alert, awake, not in acute distress Cardiovascular: RRR, S1/S2 +, no rubs, no  gallops Respiratory: CTA bilaterally, no wheezing, no rhonchi Abdominal: Soft, NT, ND, bowel sounds + Extremities: no edema, no cyanosis    The results of significant diagnostics from this hospitalization (including imaging, microbiology, ancillary and laboratory) are listed below for reference.     Microbiology: No results found for this or any previous visit (from the past 240 hour(s)).   Labs: BNP (last 3 results) No results for input(s): BNP in the last 8760 hours. Basic Metabolic Panel: No results for input(s): NA, K, CL, CO2, GLUCOSE, BUN, CREATININE, CALCIUM, MG, PHOS in the last 168 hours. Liver Function Tests: No results for input(s): AST, ALT, ALKPHOS, BILITOT, PROT, ALBUMIN in the last 168 hours. No results for input(s): LIPASE, AMYLASE in the last 168 hours. No results for input(s): AMMONIA in the last 168 hours. CBC: No results for input(s): WBC, NEUTROABS, HGB, HCT, MCV, PLT in the last 168 hours. Cardiac Enzymes: No results for input(s): CKTOTAL, CKMB, CKMBINDEX, TROPONINI in the last 168 hours. BNP: Invalid input(s): POCBNP CBG: No results for input(s): GLUCAP in the last 168 hours. D-Dimer No results for input(s): DDIMER in the last 72 hours. Hgb A1c No results for input(s): HGBA1C in the last 72 hours. Lipid Profile No results for input(s): CHOL, HDL, LDLCALC, TRIG, CHOLHDL, LDLDIRECT in the last 72 hours. Thyroid function studies No results for input(s): TSH, T4TOTAL, T3FREE, THYROIDAB in the last 72 hours.  Invalid input(s): FREET3 Anemia work up No results for input(s): VITAMINB12, FOLATE, FERRITIN, TIBC, IRON, RETICCTPCT in the last 72 hours. Urinalysis    Component Value Date/Time   COLORURINE YELLOW 01/16/2015 1552   APPEARANCEUR CLEAR 01/16/2015 1552   LABSPEC 1.015 01/16/2015 1552   PHURINE 6.5 01/16/2015 1552   GLUCOSEU NEGATIVE 01/16/2015 1552   HGBUR NEGATIVE 01/16/2015 1552   BILIRUBINUR NEGATIVE 01/16/2015 1552   KETONESUR NEGATIVE  01/16/2015 1552   PROTEINUR NEGATIVE 01/16/2015 1552   UROBILINOGEN 0.2 01/01/2010 0643   NITRITE NEGATIVE 01/16/2015 1552   LEUKOCYTESUR NEGATIVE 01/16/2015 1552   Sepsis Labs Invalid input(s): PROCALCITONIN,  WBC,  LACTICIDVEN Microbiology No results found for this or any previous visit (from the past 240 hour(s)).   Time coordinating discharge: 34 minutes  SIGNED:   Hosie Poisson, MD  [facility]

## 2019-04-27 NOTE — Telephone Encounter (Signed)
Call placed to patient and patient made aware.   Prescription sent to pharmacy.  

## 2019-05-04 ENCOUNTER — Other Ambulatory Visit: Payer: Self-pay | Admitting: Family Medicine

## 2019-05-04 ENCOUNTER — Telehealth: Payer: Self-pay | Admitting: Family Medicine

## 2019-05-04 MED ORDER — FLUCONAZOLE 150 MG PO TABS: 150.0000 mg | ORAL_TABLET | Freq: Once | ORAL | 0 refills | Status: AC

## 2019-05-04 NOTE — Telephone Encounter (Signed)
Patient said she was prescribed a med recently by dr pickard, now feels like she has a yeast infection  Would like to know if diflucan can be called in for her  [facility]

## 2019-05-04 NOTE — Telephone Encounter (Signed)
I will send in diflucan

## 2019-05-05 NOTE — Telephone Encounter (Signed)
Call placed to patient and patient made aware.  

## 2019-05-11 ENCOUNTER — Telehealth: Payer: Self-pay | Admitting: Family Medicine

## 2019-05-11 ENCOUNTER — Ambulatory Visit (INDEPENDENT_AMBULATORY_CARE_PROVIDER_SITE_OTHER): Payer: PPO | Admitting: Family Medicine

## 2019-05-11 ENCOUNTER — Encounter: Payer: Self-pay | Admitting: Family Medicine

## 2019-05-11 ENCOUNTER — Other Ambulatory Visit: Payer: Self-pay

## 2019-05-11 VITALS — BP 156/92 | HR 100 | Temp 97.2°F | Resp 16 | Ht 60.0 in | Wt 202.0 lb

## 2019-05-11 DIAGNOSIS — U071 COVID-19: Secondary | ICD-10-CM | POA: Diagnosis not present

## 2019-05-11 DIAGNOSIS — Z09 Encounter for follow-up examination after completed treatment for conditions other than malignant neoplasm: Secondary | ICD-10-CM | POA: Diagnosis not present

## 2019-05-11 NOTE — Progress Notes (Signed)
Subjective:    Patient ID: Christina Lyons, female    DOB: Jan 19, 1941, 79 y.o.   MRN: [id number]  HPI  Recently admitted to hospital with [place] and here for follow up:  Admit date: 04/16/2019 Discharge date: 1/19 /2021  Admitted From: Home. Disposition:  Home.   Recommendations for Outpatient Follow-up:  1. Follow up with PCP in 1-2 weeks 2. Please obtain BMP/CBC in one week 3. Home health with 1 lit of [place] oxygen.    Discharge Condition:STABLE.  CODE STATUS:DNR Diet recommendation: Heart Healthy   Brief/Interim Summary: 79 year old lady with history of her hypertension prior history of rectocele, cystocele, GERD, hyperlipidemia, essential hypertension, presents to ED for generalized weakness and dizziness on arrival to ED she was tested positive for Covid. She was also mildly hypoxic and was put on 2 L of nasal cannula oxygen to keep sats greater than 90% she was admitted to [facility] service for evaluation and management of COVID-19 infection  Discharge Diagnoses:  Active Problems:   COVID-19  Acute respiratory failure with hypoxia secondary to COVID-19 viral pneumonia Chest x-ray shows Mild left greater than right vague peripheral opacity, suggesting possible atypical or viral pneumonia. Patient reports her breathing has improved and we were able to wean her oxygen from 2 L/min to 1 L/min today and her sats have been around 94%. Patient  Completed remdesivirand on Decadron 6 mg daily on discharge for the completion of the course.  Recommend to use incentive spirometry and get out of bed and ambulate as tolerated. PT evaluation recommending home health PT at this time.  Essential hypertension Well-controlled blood pressures.  Non sustained V TACH Pt remains asymptomatic. Keep magnesium greater than 2, potassium greater than 4  Dizziness Probably secondary to BPPV Vestibular evaluation done and her symptoms improved with canalith repositioning maneuver. Home  health PT will be ordered on discharge.  Hyperlipidemia restart statin on discharge.   Patient is here today for follow-up.  She denies any trouble breathing.  She denies any chest pain or shortness of breath.  However she does still report fatigue.  Of note she had some arrhythmias in the hospital.  They recommended keeping her magnesium level greater than 2 and potassium level greater than 4.  Of note her calcium level is extremely low.  The patient has not been taking any calcium although she is taking vitamin D.  She avoids calcium due to the fact that it constipates her.  I explained that low levels of electrolytes can sometimes precipitate heart arrhythmias in addition to complicating her osteoporosis.  She denies any abdominal pain.  She denies any nausea or vomiting.  Her fatigue is slowly getting better.  She denies any bleeding or bruising.  Past Medical History:  Diagnosis Date  . DDD (degenerative disc disease), lumbar   . GERD (gastroesophageal reflux disease)   . Hyperlipidemia   . Hypertension   . Osteomyelitis ([place]) 1946   both legs  . Osteoporosis   . Other idiopathic scoliosis, thoracolumbar region    Past Surgical History:  Procedure Laterality Date  . ABDOMINAL HYSTERECTOMY    . CHOLECYSTECTOMY    . JOINT REPLACEMENT     right knee  Right knee replacement Current Outpatient Medications on File Prior to Visit  Medication Sig Dispense Refill  . albuterol (PROVENTIL HFA;VENTOLIN HFA) 108 (90 Base) MCG/ACT inhaler Inhale 2 puffs into the lungs every 6 (six) hours as needed for wheezing or shortness of breath. 1 Inhaler 2  . Ascorbic Acid (  VITAMIN C PO) Take 1 tablet by mouth daily with supper.    Marland Kitchen aspirin EC 81 MG tablet Take 81 mg by mouth at bedtime.     Marland Kitchen b complex vitamins tablet Take 1 tablet by mouth daily with supper.    . cetirizine (ZYRTEC) 10 MG tablet Take 10 mg by mouth at bedtime.    . cholecalciferol (VITAMIN D) 1000 units tablet Take 1,000 Units by  mouth daily with supper.     . dexamethasone (DECADRON) 6 MG tablet Take 1 tablet (6 mg total) by mouth daily. 6 tablet 0  . fluticasone (FLONASE) 50 MCG/ACT nasal spray Place 1 spray into both nostrils every 12 (twelve) hours as needed for allergies.     Marland Kitchen gabapentin (NEURONTIN) 100 MG capsule Take 200 mg by mouth 2 (two) times daily.     Marland Kitchen HYDROcodone-acetaminophen (NORCO/VICODIN) 5-325 MG tablet Take 1 tablet by mouth every 6 (six) hours as needed. Chronic Pain. Dx: G89.4 (Patient taking differently: Take 1 tablet by mouth every 6 (six) hours as needed for moderate pain or severe pain. Chronic Pain. Dx: G89.4) 60 tablet 0  . LORazepam (ATIVAN) 1 MG tablet TAKE 1 TABLET BY MOUTH TWICE A DAY AS NEEDED FOR ANXIETY (Patient taking differently: Take 1 mg by mouth 2 (two) times daily as needed for anxiety. ) 60 tablet 0  . losartan (COZAAR) 50 MG tablet TAKE 1 TABLET BY MOUTH EVERY DAY. (Patient taking differently: Take 50 mg by mouth daily. ) 90 tablet 3  . Multiple Vitamin (MULTIVITAMIN WITH MINERALS) TABS tablet Take 1 tablet by mouth daily with supper.    . Multiple Vitamins-Minerals (ZINC PO) Take 1 tablet by mouth daily with supper.    Marland Kitchen omeprazole (PRILOSEC) 20 MG capsule TAKE 1 CAPSULE BY MOUTH TWICE A DAY (Patient taking differently: Take 20 mg by mouth daily. ) 180 capsule 3  . oxybutynin (DITROPAN) 5 MG tablet Take 1 tablet (5 mg total) by mouth 2 (two) times daily. 60 tablet 1  . promethazine (PHENERGAN) 25 MG tablet TAKE 1 TABLET BY MOUTH EVERY 8 HOURS AS NEEDED FOR NAUSEA AND VOMITING (Patient taking differently: Take 25 mg by mouth every 8 (eight) hours as needed for nausea or vomiting. ) 20 tablet 2  . rosuvastatin (CRESTOR) 5 MG tablet Take 1 tablet (5 mg total) by mouth at bedtime.     Current Facility-Administered Medications on File Prior to Visit  Medication Dose Route Frequency Provider Last Rate Last Admin  . denosumab (PROLIA) injection 60 mg  60 mg Subcutaneous Q6 months  [name], MD   60 mg at 12/29/18 1442   Allergies  Allergen Reactions  . Sulfa Antibiotics Hives   Social History   Socioeconomic History  . Marital status: Widowed    Spouse name: Not on file  . Number of children: Not on file  . Years of education: Not on file  . Highest education level: Not on file  Occupational History  . Occupation: Retired  Tobacco Use  . Smoking status: Former Smoker    Packs/day: 0.50    Years: 5.00    Pack years: 2.50    Types: Cigarettes    Quit date: 04/01/1980    Years since quitting: 39.1  . Smokeless tobacco: Never Used  Substance and Sexual Activity  . Alcohol use: No    Alcohol/week: 0.0 standard drinks  . Drug use: No  . Sexual activity: Not on file  Other Topics Concern  . Not  on file  Social History Narrative  . Not on file   Social Determinants of Health   Financial Resource Strain:   . Difficulty of Paying Living Expenses: Not on file  Food Insecurity:   . Worried About Charity fundraiser in the Last Year: Not on file  . Ran Out of Food in the Last Year: Not on file  Transportation Needs:   . Lack of Transportation (Medical): Not on file  . Lack of Transportation (Non-Medical): Not on file  Physical Activity:   . Days of Exercise per Week: Not on file  . Minutes of Exercise per Session: Not on file  Stress:   . Feeling of Stress : Not on file  Social Connections:   . Frequency of Communication with Friends and Family: Not on file  . Frequency of Social Gatherings with Friends and Family: Not on file  . Attends Religious Services: Not on file  . Active Member of Clubs or Organizations: Not on file  . Attends Archivist Meetings: Not on file  . Marital Status: Not on file  Intimate Partner Violence:   . Fear of Current or Ex-Partner: Not on file  . Emotionally Abused: Not on file  . Physically Abused: Not on file  . Sexually Abused: Not on file   Family History  Problem Relation Age of Onset  .  Asthma Mother   . Asthma Sister   . Heart failure Father   . Breast cancer Sister   . Breast cancer Maternal Aunt      Review of Systems  All other systems reviewed and are negative.      Objective:   Physical Exam  Constitutional: She is oriented to person, place, and time. She appears well-developed and well-nourished. No distress.  HENT:  Head: Normocephalic and atraumatic.  Right Ear: External ear normal.  Left Ear: External ear normal.  Nose: Nose normal.  Mouth/Throat: Oropharynx is clear and moist. No oropharyngeal exudate.  Eyes: Pupils are equal, round, and reactive to light. Conjunctivae and EOM are normal. Right eye exhibits no discharge. Left eye exhibits no discharge. No scleral icterus.  Neck: No JVD present. No tracheal deviation present. No thyromegaly present.  Cardiovascular: Normal rate, regular rhythm, normal heart sounds and intact distal pulses. Exam reveals no gallop and no friction rub.  No murmur heard. Pulmonary/Chest: Effort normal and breath sounds normal. No stridor. No respiratory distress. She has no wheezes. She has no rales. She exhibits no tenderness.  Abdominal: Soft. Bowel sounds are normal. She exhibits no distension and no mass. There is no abdominal tenderness. There is no rebound and no guarding.  Musculoskeletal:        General: No tenderness, deformity or edema. Normal range of motion.     Cervical back: Normal range of motion and neck supple.  Lymphadenopathy:    She has no cervical adenopathy.  Neurological: She is alert and oriented to person, place, and time. She has normal reflexes. No cranial nerve deficit. She exhibits normal muscle tone. Coordination normal.  Skin: Skin is warm. No rash noted. She is not diaphoretic. No erythema. No pallor.  Psychiatric: She has a normal mood and affect. Her behavior is normal. Judgment and thought content normal.  Vitals reviewed.         Assessment & Plan:  COVID-19 - Plan: CBC with  Differential/Platelet, COMPLETE METABOLIC PANEL WITH GFR  Hospital discharge follow-up - Plan: CBC with Differential/Platelet, COMPLETE METABOLIC PANEL WITH GFR  Hypocalcemia  Clinically the patient is doing much better.  I have recommended checking a CBC along with her electrolytes by checking a CMP.  I have recommended that she receive the Covid vaccine in 3 months.  I will check her calcium level again.  I have encouraged her to take calcium carbonate/Tums, 3 tablets a day in an effort to at least get some calcium because the patient can tolerate Tums without constipation.  If her calcium level is low today I would recommend repeating in 3 weeks after she has started taking the Tums.

## 2019-05-11 NOTE — Telephone Encounter (Addendum)
Awaiting forms to add to prescription assistance.  Forms faxed to the prescription assistance.

## 2019-05-11 NOTE — Telephone Encounter (Signed)
Patient came in and dropped off eob and bill for her prolia  Will route to christina

## 2019-05-12 LAB — CBC WITH DIFFERENTIAL/PLATELET
Absolute Monocytes: 694 cells/uL (ref 200–950)
Basophils Absolute: 48 cells/uL (ref 0–200)
Basophils Relative: 0.7 %
Eosinophils Absolute: 272 cells/uL (ref 15–500)
Eosinophils Relative: 4 %
HCT: 41.9 % (ref 35.0–45.0)
Hemoglobin: 13.7 g/dL (ref 11.7–15.5)
Lymphs Abs: 1197 cells/uL (ref 850–3900)
MCH: 33.2 pg — ABNORMAL HIGH (ref 27.0–33.0)
MCHC: 32.7 g/dL (ref 32.0–36.0)
MCV: 101.5 fL — ABNORMAL HIGH (ref 80.0–100.0)
MPV: 9.6 fL (ref 7.5–12.5)
Monocytes Relative: 10.2 %
Neutro Abs: 4590 cells/uL (ref 1500–7800)
Neutrophils Relative %: 67.5 %
Platelets: 250 10*3/uL (ref 140–400)
RBC: 4.13 10*6/uL (ref 3.80–5.10)
RDW: 14 % (ref 11.0–15.0)
Total Lymphocyte: 17.6 %
WBC: 6.8 10*3/uL (ref 3.8–10.8)

## 2019-05-12 LAB — COMPLETE METABOLIC PANEL WITH GFR
AG Ratio: 1.6 (calc) (ref 1.0–2.5)
ALT: 15 U/L (ref 6–29)
AST: 17 U/L (ref 10–35)
Albumin: 3.9 g/dL (ref 3.6–5.1)
Alkaline phosphatase (APISO): 50 U/L (ref 37–153)
BUN/Creatinine Ratio: 25 (calc) — ABNORMAL HIGH (ref 6–22)
BUN: 14 mg/dL (ref 7–25)
CO2: 25 mmol/L (ref 20–32)
Calcium: 9.1 mg/dL (ref 8.6–10.4)
Chloride: 106 mmol/L (ref 98–110)
Creat: 0.57 mg/dL — ABNORMAL LOW (ref 0.60–0.93)
GFR, Est African American: 104 mL/min/{1.73_m2} (ref >=60)
GFR, Est Non African American: 90 mL/min/{1.73_m2} (ref >=60)
Globulin: 2.5 g/dL (calc) (ref 1.9–3.7)
Glucose, Bld: 84 mg/dL (ref 65–99)
Potassium: 4.8 mmol/L (ref 3.5–5.3)
Sodium: 142 mmol/L (ref 135–146)
Total Bilirubin: 0.5 mg/dL (ref 0.2–1.2)
Total Protein: 6.4 g/dL (ref 6.1–8.1)

## 2019-05-17 ENCOUNTER — Other Ambulatory Visit: Payer: Self-pay | Admitting: Family Medicine

## 2019-05-17 ENCOUNTER — Other Ambulatory Visit: Payer: PPO

## 2019-05-17 ENCOUNTER — Other Ambulatory Visit: Payer: Self-pay

## 2019-05-17 DIAGNOSIS — R300 Dysuria: Secondary | ICD-10-CM | POA: Diagnosis not present

## 2019-05-17 LAB — URINALYSIS, ROUTINE W REFLEX MICROSCOPIC
Bilirubin Urine: NEGATIVE
Glucose, UA: NEGATIVE
Hgb urine dipstick: NEGATIVE
Hyaline Cast: NONE SEEN /LPF
Ketones, ur: NEGATIVE
Nitrite: POSITIVE — AB
Protein, ur: NEGATIVE
RBC / HPF: NONE SEEN /HPF (ref 0–2)
Specific Gravity, Urine: 1.02 (ref 1.001–1.03)
pH: 5.5 (ref 5.0–8.0)

## 2019-05-17 LAB — MICROSCOPIC MESSAGE

## 2019-05-17 MED ORDER — CEPHALEXIN 500 MG PO CAPS: 500.0000 mg | ORAL_CAPSULE | Freq: Three times a day (TID) | ORAL | 0 refills | Status: DC

## 2019-05-19 NOTE — Telephone Encounter (Signed)
Patient brought in the information that was needed.

## 2019-05-21 ENCOUNTER — Other Ambulatory Visit: Payer: Self-pay | Admitting: Family Medicine

## 2019-05-21 ENCOUNTER — Telehealth: Payer: Self-pay | Admitting: Family Medicine

## 2019-05-21 DIAGNOSIS — U071 COVID-19: Secondary | ICD-10-CM | POA: Diagnosis not present

## 2019-05-21 MED ORDER — HYDROCODONE-ACETAMINOPHEN 5-325 MG PO TABS: 1.0000 | ORAL_TABLET | Freq: Four times a day (QID) | ORAL | 0 refills | Status: DC | PRN

## 2019-05-21 MED ORDER — NITROFURANTOIN MONOHYD MACRO 100 MG PO CAPS: 100.0000 mg | ORAL_CAPSULE | Freq: Two times a day (BID) | ORAL | 0 refills | Status: DC

## 2019-05-21 NOTE — Telephone Encounter (Signed)
Pt called and states that the antibx for her UTI that you put her on is causing her nausea and stomach ache, she doe not want to take anymore of it and would like something else called in if possible?

## 2019-05-21 NOTE — Telephone Encounter (Signed)
Patient is requesting a refill on Hydrocodone   LOV: 05/11/19   LRF:   12/29/18

## 2019-05-21 NOTE — Telephone Encounter (Signed)
I sent in macrobid 

## 2019-05-21 NOTE — Telephone Encounter (Signed)
Patient needs refill on hydrocodone  °[facility]  ° °

## 2019-05-21 NOTE — Telephone Encounter (Signed)
Pt aware via vm 

## 2019-05-28 ENCOUNTER — Other Ambulatory Visit: Payer: Self-pay

## 2019-05-28 ENCOUNTER — Ambulatory Visit (INDEPENDENT_AMBULATORY_CARE_PROVIDER_SITE_OTHER): Payer: PPO | Admitting: Family Medicine

## 2019-05-28 ENCOUNTER — Encounter: Payer: Self-pay | Admitting: Family Medicine

## 2019-05-28 VITALS — BP 122/80 | HR 90 | Temp 97.6°F | Resp 16 | Ht 60.0 in | Wt 207.0 lb

## 2019-05-28 DIAGNOSIS — U071 COVID-19: Secondary | ICD-10-CM | POA: Diagnosis not present

## 2019-05-28 NOTE — Progress Notes (Signed)
Subjective:    Patient ID: Christina Lyons, female    DOB: September 22, 1940, 79 y.o.   MRN: [id number]  HPI 05/11/19 Recently admitted to hospital with [place] and here for follow up:  Admit date: 04/16/2019 Discharge date: 1/19 /2021  Admitted From: Home. Disposition:  Home.   Recommendations for Outpatient Follow-up:  1. Follow up with PCP in 1-2 weeks 2. Please obtain BMP/CBC in one week 3. Home health with 1 lit of [place] oxygen.    Discharge Condition:STABLE.  CODE STATUS:DNR Diet recommendation: Heart Healthy   Brief/Interim Summary: 79 year old lady with history of her hypertension prior history of rectocele, cystocele, GERD, hyperlipidemia, essential hypertension, presents to ED for generalized weakness and dizziness on arrival to ED she was tested positive for Covid. She was also mildly hypoxic and was put on 2 L of nasal cannula oxygen to keep sats greater than 90% she was admitted to [facility] service for evaluation and management of COVID-19 infection  Discharge Diagnoses:  Active Problems:   COVID-19  Acute respiratory failure with hypoxia secondary to COVID-19 viral pneumonia Chest x-ray shows Mild left greater than right vague peripheral opacity, suggesting possible atypical or viral pneumonia. Patient reports her breathing has improved and we were able to wean her oxygen from 2 L/min to 1 L/min today and her sats have been around 94%. Patient  Completed remdesivirand on Decadron 6 mg daily on discharge for the completion of the course.  Recommend to use incentive spirometry and get out of bed and ambulate as tolerated. PT evaluation recommending home health PT at this time.  Essential hypertension Well-controlled blood pressures.  Non sustained V TACH Pt remains asymptomatic. Keep magnesium greater than 2, potassium greater than 4  Dizziness Probably secondary to BPPV Vestibular evaluation done and her symptoms improved with canalith repositioning  maneuver. Home health PT will be ordered on discharge.  Hyperlipidemia restart statin on discharge.   Patient is here today for follow-up.  She denies any trouble breathing.  She denies any chest pain or shortness of breath.  However she does still report fatigue.  Of note she had some arrhythmias in the hospital.  They recommended keeping her magnesium level greater than 2 and potassium level greater than 4.  Of note her calcium level is extremely low.  The patient has not been taking any calcium although she is taking vitamin D.  She avoids calcium due to the fact that it constipates her.  I explained that low levels of electrolytes can sometimes precipitate heart arrhythmias in addition to complicating her osteoporosis.  She denies any abdominal pain.  She denies any nausea or vomiting.  Her fatigue is slowly getting better.  She denies any bleeding or bruising.  At that time, my plan was: Clinically the patient is doing much better.  I have recommended checking a CBC along with her electrolytes by checking a CMP.  I have recommended that she receive the Covid vaccine in 3 months.  I will check her calcium level again.  I have encouraged her to take calcium carbonate/Tums, 3 tablets a day in an effort to at least get some calcium because the patient can tolerate Tums without constipation.  If her calcium level is low today I would recommend repeating in 3 weeks after she has started taking the Tums.   05/28/19 Patient states that she just wants her lungs checked.  She states that she has heard a rattle in her airways when she lays down.  She denies any  chest pain.  She denies any shortness of breath.  Her energy has improved gradually every day since being out of hospital.  She denies any pleurisy or hemoptysis.  On exam today, the patient does have occasional rhonchorous breath sounds that clear when she takes a few deep breaths and coughs very hard.  Patient is still very committed to using her  incentive spirometer and is doing that every day.  She is also trying to exercise more each day.  She is not using any Mucinex.  Past Medical History:  Diagnosis Date  . DDD (degenerative disc disease), lumbar   . GERD (gastroesophageal reflux disease)   . Hyperlipidemia   . Hypertension   . Osteomyelitis ([place]) 1946   both legs  . Osteoporosis   . Other idiopathic scoliosis, thoracolumbar region    Past Surgical History:  Procedure Laterality Date  . ABDOMINAL HYSTERECTOMY    . CHOLECYSTECTOMY    . JOINT REPLACEMENT     right knee  Right knee replacement Current Outpatient Medications on File Prior to Visit  Medication Sig Dispense Refill  . albuterol (PROVENTIL HFA;VENTOLIN HFA) 108 (90 Base) MCG/ACT inhaler Inhale 2 puffs into the lungs every 6 (six) hours as needed for wheezing or shortness of breath. (Patient not taking: Reported on 05/11/2019) 1 Inhaler 2  . Ascorbic Acid (VITAMIN C PO) Take 1 tablet by mouth daily with supper.    Marland Kitchen aspirin EC 81 MG tablet Take 81 mg by mouth at bedtime.     Marland Kitchen b complex vitamins tablet Take 1 tablet by mouth daily with supper.    . cetirizine (ZYRTEC) 10 MG tablet Take 10 mg by mouth at bedtime.    . cholecalciferol (VITAMIN D) 1000 units tablet Take 1,000 Units by mouth daily with supper.     . dexamethasone (DECADRON) 6 MG tablet Take 1 tablet (6 mg total) by mouth daily. (Patient not taking: Reported on 05/11/2019) 6 tablet 0  . fluticasone (FLONASE) 50 MCG/ACT nasal spray Place 1 spray into both nostrils every 12 (twelve) hours as needed for allergies.     Marland Kitchen gabapentin (NEURONTIN) 100 MG capsule Take 200 mg by mouth 2 (two) times daily.     Marland Kitchen HYDROcodone-acetaminophen (NORCO/VICODIN) 5-325 MG tablet Take 1 tablet by mouth every 6 (six) hours as needed. Chronic Pain. Dx: G89.4 60 tablet 0  . LORazepam (ATIVAN) 1 MG tablet TAKE 1 TABLET BY MOUTH TWICE A DAY AS NEEDED FOR ANXIETY 60 tablet 0  . losartan (COZAAR) 50 MG tablet TAKE 1 TABLET BY MOUTH  EVERY DAY. (Patient not taking: Reported on 05/11/2019) 90 tablet 3  . Multiple Vitamin (MULTIVITAMIN WITH MINERALS) TABS tablet Take 1 tablet by mouth daily with supper.    . Multiple Vitamins-Minerals (ZINC PO) Take 1 tablet by mouth daily with supper.    . nitrofurantoin, macrocrystal-monohydrate, (MACROBID) 100 MG capsule Take 1 capsule (100 mg total) by mouth 2 (two) times daily. 6 capsule 0  . omeprazole (PRILOSEC) 20 MG capsule TAKE 1 CAPSULE BY MOUTH TWICE A DAY (Patient not taking: No sig reported) 180 capsule 3  . oxybutynin (DITROPAN) 5 MG tablet Take 1 tablet (5 mg total) by mouth 2 (two) times daily. (Patient not taking: Reported on 05/11/2019) 60 tablet 1  . promethazine (PHENERGAN) 25 MG tablet TAKE 1 TABLET BY MOUTH EVERY 8 HOURS AS NEEDED FOR NAUSEA AND VOMITING (Patient taking differently: Take 25 mg by mouth every 8 (eight) hours as needed for nausea or vomiting. )  20 tablet 2  . rosuvastatin (CRESTOR) 5 MG tablet Take 1 tablet (5 mg total) by mouth at bedtime. (Patient not taking: Reported on 05/11/2019)     Current Facility-Administered Medications on File Prior to Visit  Medication Dose Route Frequency Provider Last Rate Last Admin  . denosumab (PROLIA) injection 60 mg  60 mg Subcutaneous Q6 months [name], MD   60 mg at 12/29/18 1442   Allergies  Allergen Reactions  . Sulfa Antibiotics Hives   Social History   Socioeconomic History  . Marital status: Widowed    Spouse name: Not on file  . Number of children: Not on file  . Years of education: Not on file  . Highest education level: Not on file  Occupational History  . Occupation: Retired  Tobacco Use  . Smoking status: Former Smoker    Packs/day: 0.50    Years: 5.00    Pack years: 2.50    Types: Cigarettes    Quit date: 04/01/1980    Years since quitting: 39.1  . Smokeless tobacco: Never Used  Substance and Sexual Activity  . Alcohol use: No    Alcohol/week: 0.0 standard drinks  . Drug use: No  .  Sexual activity: Not on file  Other Topics Concern  . Not on file  Social History Narrative  . Not on file   Social Determinants of Health   Financial Resource Strain:   . Difficulty of Paying Living Expenses: Not on file  Food Insecurity:   . Worried About Charity fundraiser in the Last Year: Not on file  . Ran Out of Food in the Last Year: Not on file  Transportation Needs:   . Lack of Transportation (Medical): Not on file  . Lack of Transportation (Non-Medical): Not on file  Physical Activity:   . Days of Exercise per Week: Not on file  . Minutes of Exercise per Session: Not on file  Stress:   . Feeling of Stress : Not on file  Social Connections:   . Frequency of Communication with Friends and Family: Not on file  . Frequency of Social Gatherings with Friends and Family: Not on file  . Attends Religious Services: Not on file  . Active Member of Clubs or Organizations: Not on file  . Attends Archivist Meetings: Not on file  . Marital Status: Not on file  Intimate Partner Violence:   . Fear of Current or Ex-Partner: Not on file  . Emotionally Abused: Not on file  . Physically Abused: Not on file  . Sexually Abused: Not on file   Family History  Problem Relation Age of Onset  . Asthma Mother   . Asthma Sister   . Heart failure Father   . Breast cancer Sister   . Breast cancer Maternal Aunt      Review of Systems  All other systems reviewed and are negative.      Objective:   Physical Exam  Constitutional: She is oriented to person, place, and time. She appears well-developed and well-nourished. No distress.  HENT:  Head: Normocephalic and atraumatic.  Right Ear: External ear normal.  Left Ear: External ear normal.  Nose: Nose normal.  Mouth/Throat: Oropharynx is clear and moist. No oropharyngeal exudate.  Eyes: Pupils are equal, round, and reactive to light. Conjunctivae and EOM are normal. Right eye exhibits no discharge. Left eye exhibits no  discharge. No scleral icterus.  Neck: No JVD present. No tracheal deviation present. No thyromegaly present.  Cardiovascular: Normal rate, regular rhythm, normal heart sounds and intact distal pulses. Exam reveals no gallop and no friction rub.  No murmur heard. Pulmonary/Chest: Effort normal and breath sounds normal. No stridor. No respiratory distress. She has no wheezes. She has no rales. She exhibits no tenderness.  Abdominal: Soft. Bowel sounds are normal. She exhibits no distension and no mass. There is no abdominal tenderness. There is no rebound and no guarding.  Musculoskeletal:        General: No tenderness, deformity or edema. Normal range of motion.     Cervical back: Normal range of motion and neck supple.  Lymphadenopathy:    She has no cervical adenopathy.  Neurological: She is alert and oriented to person, place, and time. She has normal reflexes. No cranial nerve deficit. She exhibits normal muscle tone. Coordination normal.  Skin: Skin is warm. No rash noted. She is not diaphoretic. No erythema. No pallor.  Psychiatric: She has a normal mood and affect. Her behavior is normal. Judgment and thought content normal.  Vitals reviewed.         Assessment & Plan:  COVID-19  I believe the patient is completely recovered from her Covid.  I explained to the patient it may take several weeks for her body to feel back to "normal".  However I do not believe the patient is contagious and I have only encouraged her to gradually increase her activity as tolerated to build back up her stamina and her conditioning.  I did encourage her to use her incentive spirometer.  She can also use Mucinex for the rhonchorous breath sounds I do believe she may have some mucus and upper airway congestion causing the "rattle" that she hears when she breathes at night.  Lovey Newcomer

## 2019-06-01 ENCOUNTER — Telehealth: Payer: Self-pay | Admitting: Family Medicine

## 2019-06-01 MED ORDER — NITROFURANTOIN MONOHYD MACRO 100 MG PO CAPS: 100.0000 mg | ORAL_CAPSULE | Freq: Two times a day (BID) | ORAL | 0 refills | Status: AC

## 2019-06-01 NOTE — Telephone Encounter (Signed)
Pt called and states that 3 days ago she started itching and burning with urination and by yesterday it felt like she was urinating "razor blades". She wanted to know if you would call the antibx in again but maybe for longer?  Per Dr. Dennard Schaumann ok to refill and for 7 days. Med sent to pharm and pt aware.

## 2019-06-18 ENCOUNTER — Telehealth: Payer: Self-pay | Admitting: Family Medicine

## 2019-06-18 DIAGNOSIS — U071 COVID-19: Secondary | ICD-10-CM | POA: Diagnosis not present

## 2019-06-18 NOTE — Telephone Encounter (Signed)
I have left vm for patient to bring [place] her EOB for her DOS 12/29/2018. [place] has denied reimbursement due to EOB not having her name on it. Once patient brings in complete EOB for DOS please give to [facility]

## 2019-06-18 NOTE — Telephone Encounter (Signed)
Patient returned my call. She is aware that we need the complete EOB with her name on the first page. She will bring to office once she gets it.

## 2019-07-19 DIAGNOSIS — U071 COVID-19: Secondary | ICD-10-CM | POA: Diagnosis not present

## 2019-08-16 DIAGNOSIS — E559 Vitamin D deficiency, unspecified: Secondary | ICD-10-CM | POA: Diagnosis not present

## 2019-08-16 DIAGNOSIS — M129 Arthropathy, unspecified: Secondary | ICD-10-CM | POA: Diagnosis not present

## 2019-08-16 DIAGNOSIS — R5383 Other fatigue: Secondary | ICD-10-CM | POA: Diagnosis not present

## 2019-08-16 DIAGNOSIS — D539 Nutritional anemia, unspecified: Secondary | ICD-10-CM | POA: Diagnosis not present

## 2019-08-16 DIAGNOSIS — Z1159 Encounter for screening for other viral diseases: Secondary | ICD-10-CM | POA: Diagnosis not present

## 2019-08-16 DIAGNOSIS — Z13220 Encounter for screening for lipoid disorders: Secondary | ICD-10-CM | POA: Diagnosis not present

## 2019-08-16 DIAGNOSIS — Z0000 Encounter for general adult medical examination without abnormal findings: Secondary | ICD-10-CM | POA: Diagnosis not present

## 2019-08-16 DIAGNOSIS — Z79899 Other long term (current) drug therapy: Secondary | ICD-10-CM | POA: Diagnosis not present

## 2019-08-16 DIAGNOSIS — J209 Acute bronchitis, unspecified: Secondary | ICD-10-CM | POA: Diagnosis not present

## 2019-08-16 DIAGNOSIS — I10 Essential (primary) hypertension: Secondary | ICD-10-CM | POA: Diagnosis not present

## 2019-08-18 DIAGNOSIS — U071 COVID-19: Secondary | ICD-10-CM | POA: Diagnosis not present

## 2019-08-19 ENCOUNTER — Telehealth: Payer: Self-pay | Admitting: Family Medicine

## 2019-08-19 MED ORDER — HYDROCODONE-ACETAMINOPHEN 5-325 MG PO TABS: 1.0000 | ORAL_TABLET | Freq: Four times a day (QID) | ORAL | 0 refills | Status: DC | PRN

## 2019-08-19 NOTE — Telephone Encounter (Signed)
CB# [phone] refill Hydrocodone

## 2019-08-19 NOTE — Telephone Encounter (Signed)
Patient is requesting a refill on Hydrocodone   LOV: 05/28/2019  LRF:   05/21/2019

## 2019-08-30 DIAGNOSIS — G629 Polyneuropathy, unspecified: Secondary | ICD-10-CM | POA: Diagnosis not present

## 2019-08-30 DIAGNOSIS — Z79899 Other long term (current) drug therapy: Secondary | ICD-10-CM | POA: Diagnosis not present

## 2019-08-30 DIAGNOSIS — I10 Essential (primary) hypertension: Secondary | ICD-10-CM | POA: Diagnosis not present

## 2019-08-30 DIAGNOSIS — M5030 Other cervical disc degeneration, unspecified cervical region: Secondary | ICD-10-CM | POA: Diagnosis not present

## 2019-09-17 ENCOUNTER — Other Ambulatory Visit: Payer: Self-pay

## 2019-09-17 ENCOUNTER — Ambulatory Visit (INDEPENDENT_AMBULATORY_CARE_PROVIDER_SITE_OTHER): Payer: PPO | Admitting: Family Medicine

## 2019-09-17 VITALS — BP 120/80 | HR 89 | Temp 98.1°F | Wt 216.0 lb

## 2019-09-17 DIAGNOSIS — M25562 Pain in left knee: Secondary | ICD-10-CM

## 2019-09-17 DIAGNOSIS — G8929 Other chronic pain: Secondary | ICD-10-CM | POA: Diagnosis not present

## 2019-09-17 NOTE — Progress Notes (Signed)
Subjective:    Patient ID: Christina Lyons, female    DOB: 12/09/1940, 79 y.o.   MRN: [id number]  HPI Patient presents today complaining of pain in her left knee.  The pain is worse whenever she is walking or trying to go down steps.  She reports sharp pain over her medial and lateral joint line anteriorly.  She also has a mild joint effusion.  She denies any laxity to varus or valgus stress.  She has a history of osteoarthritis in the knee.  She has been favoring her knee so much that she is now starting to ache over the lateral aspect of her left hip.  Therefore she is requesting a cortisone injection in her knee today.  She denies any falls or injuries.  She denies any fevers or chills.  Past Medical History:  Diagnosis Date  . DDD (degenerative disc disease), lumbar   . GERD (gastroesophageal reflux disease)   . Hyperlipidemia   . Hypertension   . Osteomyelitis ([place]) 1946   both legs  . Osteoporosis   . Other idiopathic scoliosis, thoracolumbar region    Past Surgical History:  Procedure Laterality Date  . ABDOMINAL HYSTERECTOMY    . CHOLECYSTECTOMY    . JOINT REPLACEMENT     right knee   Current Outpatient Medications on File Prior to Visit  Medication Sig Dispense Refill  . Ascorbic Acid (VITAMIN C PO) Take 1 tablet by mouth daily with supper.    Marland Kitchen aspirin EC 81 MG tablet Take 81 mg by mouth at bedtime.     Marland Kitchen b complex vitamins tablet Take 1 tablet by mouth daily with supper.    . cetirizine (ZYRTEC) 10 MG tablet Take 10 mg by mouth at bedtime.    . cholecalciferol (VITAMIN D) 1000 units tablet Take 1,000 Units by mouth daily with supper.     . fluticasone (FLONASE) 50 MCG/ACT nasal spray Place 1 spray into both nostrils every 12 (twelve) hours as needed for allergies.     Marland Kitchen gabapentin (NEURONTIN) 100 MG capsule Take 200 mg by mouth 2 (two) times daily.     Marland Kitchen HYDROcodone-acetaminophen (NORCO/VICODIN) 5-325 MG tablet Take 1 tablet by mouth every 6 (six) hours as needed.  Chronic Pain. Dx: G89.4 60 tablet 0  . LORazepam (ATIVAN) 1 MG tablet TAKE 1 TABLET BY MOUTH TWICE A DAY AS NEEDED FOR ANXIETY 60 tablet 0  . losartan (COZAAR) 50 MG tablet TAKE 1 TABLET BY MOUTH EVERY DAY. 90 tablet 3  . Multiple Vitamin (MULTIVITAMIN WITH MINERALS) TABS tablet Take 1 tablet by mouth daily with supper.    . Multiple Vitamins-Minerals (ZINC PO) Take 1 tablet by mouth daily with supper.    Marland Kitchen omeprazole (PRILOSEC) 20 MG capsule TAKE 1 CAPSULE BY MOUTH TWICE A DAY 180 capsule 3  . promethazine (PHENERGAN) 25 MG tablet TAKE 1 TABLET BY MOUTH EVERY 8 HOURS AS NEEDED FOR NAUSEA AND VOMITING (Patient taking differently: Take 25 mg by mouth every 8 (eight) hours as needed for nausea or vomiting. ) 20 tablet 2  . oxybutynin (DITROPAN) 5 MG tablet Take 1 tablet (5 mg total) by mouth 2 (two) times daily. (Patient not taking: Reported on 09/17/2019) 60 tablet 1  . rosuvastatin (CRESTOR) 5 MG tablet Take 1 tablet (5 mg total) by mouth at bedtime. (Patient not taking: Reported on 09/17/2019)     Current Facility-Administered Medications on File Prior to Visit  Medication Dose Route Frequency Provider Last Rate Last Admin  .  denosumab (PROLIA) injection 60 mg  60 mg Subcutaneous Q6 months [name], MD   60 mg at 12/29/18 1442   Allergies  Allergen Reactions  . Sulfa Antibiotics Hives   Social History   Socioeconomic History  . Marital status: Widowed    Spouse name: Not on file  . Number of children: Not on file  . Years of education: Not on file  . Highest education level: Not on file  Occupational History  . Occupation: Retired  Tobacco Use  . Smoking status: Former Smoker    Packs/day: 0.50    Years: 5.00    Pack years: 2.50    Types: Cigarettes    Quit date: 04/01/1980    Years since quitting: 39.4  . Smokeless tobacco: Never Used  Substance and Sexual Activity  . Alcohol use: No    Alcohol/week: 0.0 standard drinks  . Drug use: No  . Sexual activity: Not on file   Other Topics Concern  . Not on file  Social History Narrative  . Not on file   Social Determinants of Health   Financial Resource Strain:   . Difficulty of Paying Living Expenses:   Food Insecurity:   . Worried About Charity fundraiser in the Last Year:   . Arboriculturist in the Last Year:   Transportation Needs:   . Film/video editor (Medical):   Marland Kitchen Lack of Transportation (Non-Medical):   Physical Activity:   . Days of Exercise per Week:   . Minutes of Exercise per Session:   Stress:   . Feeling of Stress :   Social Connections:   . Frequency of Communication with Friends and Family:   . Frequency of Social Gatherings with Friends and Family:   . Attends Religious Services:   . Active Member of Clubs or Organizations:   . Attends Archivist Meetings:   Marland Kitchen Marital Status:   Intimate Partner Violence:   . Fear of Current or Ex-Partner:   . Emotionally Abused:   Marland Kitchen Physically Abused:   . Sexually Abused:       Review of Systems  All other systems reviewed and are negative.      Objective:   Physical Exam Vitals reviewed.  Constitutional:      Appearance: Normal appearance. She is normal weight.  Cardiovascular:     Rate and Rhythm: Normal rate and regular rhythm.  Pulmonary:     Effort: Pulmonary effort is normal.     Breath sounds: Normal breath sounds.  Musculoskeletal:     Left shoulder: Tenderness present. Decreased range of motion. Decreased strength.     Left knee: Swelling present. Decreased range of motion. Tenderness present over the medial joint line and lateral joint line. No LCL laxity or MCL laxity.Normal meniscus.  Neurological:     Mental Status: She is alert.           Assessment & Plan:  Chronic pain of left knee   Pain in the patient's left knee is most likely due to osteoarthritis.  Using sterile technique, I injected the left knee with 2 cc lidocaine, 2 cc of Marcaine, and 2 cc of 40 mg/mL Kenalog.  The patient  tolerated the procedure well without complication.

## 2019-09-18 DIAGNOSIS — U071 COVID-19: Secondary | ICD-10-CM | POA: Diagnosis not present

## 2019-10-14 DIAGNOSIS — B351 Tinea unguium: Secondary | ICD-10-CM | POA: Diagnosis not present

## 2019-10-14 DIAGNOSIS — M79672 Pain in left foot: Secondary | ICD-10-CM | POA: Diagnosis not present

## 2019-10-14 DIAGNOSIS — M79671 Pain in right foot: Secondary | ICD-10-CM | POA: Diagnosis not present

## 2019-10-14 DIAGNOSIS — L84 Corns and callosities: Secondary | ICD-10-CM | POA: Diagnosis not present

## 2019-10-18 DIAGNOSIS — U071 COVID-19: Secondary | ICD-10-CM | POA: Diagnosis not present

## 2019-10-28 DIAGNOSIS — T63443A Toxic effect of venom of bees, assault, initial encounter: Secondary | ICD-10-CM | POA: Diagnosis not present

## 2019-10-28 DIAGNOSIS — L298 Other pruritus: Secondary | ICD-10-CM | POA: Diagnosis not present

## 2019-11-03 DIAGNOSIS — Z1231 Encounter for screening mammogram for malignant neoplasm of breast: Secondary | ICD-10-CM | POA: Diagnosis not present

## 2019-11-03 LAB — HM MAMMOGRAPHY

## 2019-11-17 ENCOUNTER — Other Ambulatory Visit: Payer: Self-pay

## 2019-11-18 DIAGNOSIS — M129 Arthropathy, unspecified: Secondary | ICD-10-CM | POA: Diagnosis not present

## 2019-11-18 DIAGNOSIS — I10 Essential (primary) hypertension: Secondary | ICD-10-CM | POA: Diagnosis not present

## 2019-11-18 DIAGNOSIS — E559 Vitamin D deficiency, unspecified: Secondary | ICD-10-CM | POA: Diagnosis not present

## 2019-11-18 DIAGNOSIS — Z20822 Contact with and (suspected) exposure to covid-19: Secondary | ICD-10-CM | POA: Diagnosis not present

## 2019-11-18 DIAGNOSIS — G4700 Insomnia, unspecified: Secondary | ICD-10-CM | POA: Diagnosis not present

## 2019-11-18 DIAGNOSIS — Z9181 History of falling: Secondary | ICD-10-CM | POA: Diagnosis not present

## 2019-11-18 DIAGNOSIS — D539 Nutritional anemia, unspecified: Secondary | ICD-10-CM | POA: Diagnosis not present

## 2019-11-18 DIAGNOSIS — Z79899 Other long term (current) drug therapy: Secondary | ICD-10-CM | POA: Diagnosis not present

## 2019-11-18 DIAGNOSIS — M5030 Other cervical disc degeneration, unspecified cervical region: Secondary | ICD-10-CM | POA: Diagnosis not present

## 2019-11-18 DIAGNOSIS — R5383 Other fatigue: Secondary | ICD-10-CM | POA: Diagnosis not present

## 2019-11-18 DIAGNOSIS — F419 Anxiety disorder, unspecified: Secondary | ICD-10-CM | POA: Diagnosis not present

## 2019-11-18 DIAGNOSIS — F329 Major depressive disorder, single episode, unspecified: Secondary | ICD-10-CM | POA: Diagnosis not present

## 2019-11-18 DIAGNOSIS — J309 Allergic rhinitis, unspecified: Secondary | ICD-10-CM | POA: Diagnosis not present

## 2019-11-18 DIAGNOSIS — U071 COVID-19: Secondary | ICD-10-CM | POA: Diagnosis not present

## 2019-11-18 DIAGNOSIS — E7800 Pure hypercholesterolemia, unspecified: Secondary | ICD-10-CM | POA: Diagnosis not present

## 2019-11-18 MED ORDER — HYDROCODONE-ACETAMINOPHEN 5-325 MG PO TABS: 1.0000 | ORAL_TABLET | Freq: Four times a day (QID) | ORAL | 0 refills | Status: DC | PRN

## 2019-11-26 DIAGNOSIS — F419 Anxiety disorder, unspecified: Secondary | ICD-10-CM | POA: Diagnosis not present

## 2019-11-26 DIAGNOSIS — F329 Major depressive disorder, single episode, unspecified: Secondary | ICD-10-CM | POA: Diagnosis not present

## 2019-11-26 DIAGNOSIS — Z9181 History of falling: Secondary | ICD-10-CM | POA: Diagnosis not present

## 2019-12-02 ENCOUNTER — Encounter: Payer: Self-pay | Admitting: *Deleted

## 2019-12-19 DIAGNOSIS — U071 COVID-19: Secondary | ICD-10-CM | POA: Diagnosis not present

## 2020-01-18 DIAGNOSIS — U071 COVID-19: Secondary | ICD-10-CM | POA: Diagnosis not present

## 2020-01-27 ENCOUNTER — Other Ambulatory Visit: Payer: Self-pay | Admitting: Family Medicine

## 2020-01-27 MED ORDER — HYDROCODONE-ACETAMINOPHEN 5-325 MG PO TABS: 1.0000 | ORAL_TABLET | Freq: Four times a day (QID) | ORAL | 0 refills | Status: DC | PRN

## 2020-01-31 ENCOUNTER — Other Ambulatory Visit: Payer: Self-pay

## 2020-01-31 ENCOUNTER — Ambulatory Visit (INDEPENDENT_AMBULATORY_CARE_PROVIDER_SITE_OTHER): Payer: PPO | Admitting: Family Medicine

## 2020-01-31 ENCOUNTER — Other Ambulatory Visit: Payer: Self-pay | Admitting: Family Medicine

## 2020-01-31 VITALS — BP 140/70 | HR 61 | Temp 97.9°F | Ht 60.0 in | Wt 214.0 lb

## 2020-01-31 DIAGNOSIS — M25562 Pain in left knee: Secondary | ICD-10-CM

## 2020-01-31 DIAGNOSIS — G8929 Other chronic pain: Secondary | ICD-10-CM

## 2020-01-31 MED ORDER — MELOXICAM 15 MG PO TABS: 15.0000 mg | ORAL_TABLET | Freq: Every day | ORAL | 4 refills | Status: DC

## 2020-01-31 NOTE — Progress Notes (Signed)
Subjective:    Patient ID: Christina Lyons, female    DOB: 1941/02/12, 79 y.o.   MRN: [id number]  HPI Patient presents today complaining of pain in her left knee.  The pain is worse whenever she is walking or trying to go down steps.  She reports sharp pain over her medial and lateral joint line anteriorly.  She also has no joint effusion.  She denies any laxity to varus or valgus stress.  At times however she feels like her knee is going to buckle when she fully extends her leg.  She denies any falls or injuries.  She had a similar presentation in June.  At that time we gave her cortisone injection and the pain improved dramatically however it has gradually come back.   Past Medical History:  Diagnosis Date  . DDD (degenerative disc disease), lumbar   . GERD (gastroesophageal reflux disease)   . Hyperlipidemia   . Hypertension   . Osteomyelitis ([place]) 1946   both legs  . Osteoporosis   . Other idiopathic scoliosis, thoracolumbar region    Past Surgical History:  Procedure Laterality Date  . ABDOMINAL HYSTERECTOMY    . CHOLECYSTECTOMY    . JOINT REPLACEMENT     right knee   Current Outpatient Medications on File Prior to Visit  Medication Sig Dispense Refill  . Ascorbic Acid (VITAMIN C PO) Take 1 tablet by mouth daily with supper.    Marland Kitchen aspirin EC 81 MG tablet Take 81 mg by mouth at bedtime.     Marland Kitchen b complex vitamins tablet Take 1 tablet by mouth daily with supper.    . cetirizine (ZYRTEC) 10 MG tablet Take 10 mg by mouth at bedtime.    . cholecalciferol (VITAMIN D) 1000 units tablet Take 1,000 Units by mouth daily with supper.     . fluticasone (FLONASE) 50 MCG/ACT nasal spray Place 1 spray into both nostrils every 12 (twelve) hours as needed for allergies.     Marland Kitchen gabapentin (NEURONTIN) 100 MG capsule Take 200 mg by mouth 2 (two) times daily.     Marland Kitchen HYDROcodone-acetaminophen (NORCO/VICODIN) 5-325 MG tablet Take 1 tablet by mouth every 6 (six) hours as needed. Chronic Pain. Dx: G89.4  60 tablet 0  . LORazepam (ATIVAN) 1 MG tablet TAKE 1 TABLET BY MOUTH TWICE A DAY AS NEEDED FOR ANXIETY 60 tablet 0  . losartan (COZAAR) 50 MG tablet TAKE 1 TABLET BY MOUTH EVERY DAY. 90 tablet 3  . Multiple Vitamin (MULTIVITAMIN WITH MINERALS) TABS tablet Take 1 tablet by mouth daily with supper.    . Multiple Vitamins-Minerals (ZINC PO) Take 1 tablet by mouth daily with supper.    Marland Kitchen omeprazole (PRILOSEC) 20 MG capsule TAKE 1 CAPSULE BY MOUTH TWICE A DAY 180 capsule 3  . oxybutynin (DITROPAN) 5 MG tablet Take 1 tablet (5 mg total) by mouth 2 (two) times daily. 60 tablet 1  . promethazine (PHENERGAN) 25 MG tablet TAKE 1 TABLET BY MOUTH EVERY 8 HOURS AS NEEDED FOR NAUSEA AND VOMITING 20 tablet 2  . rosuvastatin (CRESTOR) 5 MG tablet Take 1 tablet (5 mg total) by mouth at bedtime.     Current Facility-Administered Medications on File Prior to Visit  Medication Dose Route Frequency Provider Last Rate Last Admin  . denosumab (PROLIA) injection 60 mg  60 mg Subcutaneous Q6 months [name], MD   60 mg at 12/29/18 1442   Allergies  Allergen Reactions  . Sulfa Antibiotics Hives   Social  History   Socioeconomic History  . Marital status: Widowed    Spouse name: Not on file  . Number of children: Not on file  . Years of education: Not on file  . Highest education level: Not on file  Occupational History  . Occupation: Retired  Tobacco Use  . Smoking status: Former Smoker    Packs/day: 0.50    Years: 5.00    Pack years: 2.50    Types: Cigarettes    Quit date: 04/01/1980    Years since quitting: 39.8  . Smokeless tobacco: Never Used  Substance and Sexual Activity  . Alcohol use: No    Alcohol/week: 0.0 standard drinks  . Drug use: No  . Sexual activity: Not on file  Other Topics Concern  . Not on file  Social History Narrative  . Not on file   Social Determinants of Health   Financial Resource Strain:   . Difficulty of Paying Living Expenses: Not on file  Food Insecurity:    . Worried About Charity fundraiser in the Last Year: Not on file  . Ran Out of Food in the Last Year: Not on file  Transportation Needs:   . Lack of Transportation (Medical): Not on file  . Lack of Transportation (Non-Medical): Not on file  Physical Activity:   . Days of Exercise per Week: Not on file  . Minutes of Exercise per Session: Not on file  Stress:   . Feeling of Stress : Not on file  Social Connections:   . Frequency of Communication with Friends and Family: Not on file  . Frequency of Social Gatherings with Friends and Family: Not on file  . Attends Religious Services: Not on file  . Active Member of Clubs or Organizations: Not on file  . Attends Archivist Meetings: Not on file  . Marital Status: Not on file  Intimate Partner Violence:   . Fear of Current or Ex-Partner: Not on file  . Emotionally Abused: Not on file  . Physically Abused: Not on file  . Sexually Abused: Not on file      Review of Systems  All other systems reviewed and are negative.      Objective:   Physical Exam Vitals reviewed.  Constitutional:      Appearance: Normal appearance. She is normal weight.  Cardiovascular:     Rate and Rhythm: Normal rate and regular rhythm.  Pulmonary:     Effort: Pulmonary effort is normal.     Breath sounds: Normal breath sounds.  Musculoskeletal:     Left knee: Swelling present. Decreased range of motion. Tenderness present over the medial joint line and lateral joint line. No LCL laxity or MCL laxity.Normal meniscus.  Neurological:     Mental Status: She is alert.           Assessment & Plan:  Chronic pain of left knee - Plan: BASIC METABOLIC PANEL WITH GFR, DG Knee Complete 4 Views Left  I suspect the patient has osteoarthritis in the left knee.  Recommend an x-ray to confirm.  Meanwhile begin meloxicam 15 mg daily.  Avoid any over-the-counter NSAIDs.  Monitor for any GI toxicity.  Obtain baseline renal function test today prior to  starting the NSAID.  If pain does not improve, the next step would be cortisone injections every 3 months versus orthopedic consultation.

## 2020-02-01 LAB — BASIC METABOLIC PANEL WITH GFR
BUN/Creatinine Ratio: 32 (calc) — ABNORMAL HIGH (ref 6–22)
BUN: 19 mg/dL (ref 7–25)
CO2: 24 mmol/L (ref 20–32)
Calcium: 9.3 mg/dL (ref 8.6–10.4)
Chloride: 103 mmol/L (ref 98–110)
Creat: 0.59 mg/dL — ABNORMAL LOW (ref 0.60–0.93)
GFR, Est African American: 103 mL/min/{1.73_m2} (ref >=60)
GFR, Est Non African American: 89 mL/min/{1.73_m2} (ref >=60)
Glucose, Bld: 80 mg/dL (ref 65–99)
Potassium: 4.1 mmol/L (ref 3.5–5.3)
Sodium: 140 mmol/L (ref 135–146)

## 2020-02-05 ENCOUNTER — Other Ambulatory Visit: Payer: Self-pay | Admitting: Family Medicine

## 2020-03-16 ENCOUNTER — Ambulatory Visit (INDEPENDENT_AMBULATORY_CARE_PROVIDER_SITE_OTHER): Payer: PPO | Admitting: Family Medicine

## 2020-03-16 ENCOUNTER — Other Ambulatory Visit: Payer: Self-pay

## 2020-03-16 VITALS — BP 140/84 | HR 86 | Temp 97.9°F | Ht 60.0 in | Wt 216.0 lb

## 2020-03-16 DIAGNOSIS — E7800 Pure hypercholesterolemia, unspecified: Secondary | ICD-10-CM | POA: Diagnosis not present

## 2020-03-16 NOTE — Progress Notes (Signed)
Subjective:    Patient ID: Christina Lyons, female    DOB: Sep 12, 1940, 79 y.o.   MRN: [id number]  Patient is here today to recheck her cholesterol.  She saw another physician who recommended that she take fish oil and stop statin.  She has been off her Crestor for more than 3 months.  She has been taking fish oil for the last 2 months.  She denies any chest pain shortness of breath or dyspnea on exertion.  Blood pressure today is 140/84 and this is with her not on her blood pressure medication.  She does report some drainage in the back of her throat.  She denies any rhinorrhea or sinus pain.  She denies any cough or shortness of breath or chest pain or fever.  Occasionally she is able to "hock up" clear sputum with a trace of green that seems to be postnasal drip.  Past Medical History:  Diagnosis Date  . DDD (degenerative disc disease), lumbar   . GERD (gastroesophageal reflux disease)   . Hyperlipidemia   . Hypertension   . Osteomyelitis ([place]) 1946   both legs  . Osteoporosis   . Other idiopathic scoliosis, thoracolumbar region    Past Surgical History:  Procedure Laterality Date  . ABDOMINAL HYSTERECTOMY    . CHOLECYSTECTOMY    . JOINT REPLACEMENT     right knee   Current Outpatient Medications on File Prior to Visit  Medication Sig Dispense Refill  . Ascorbic Acid (VITAMIN C PO) Take 1 tablet by mouth daily with supper.    Marland Kitchen aspirin EC 81 MG tablet Take 81 mg by mouth at bedtime.     Marland Kitchen b complex vitamins tablet Take 1 tablet by mouth daily with supper.    . cetirizine (ZYRTEC) 10 MG tablet Take 10 mg by mouth at bedtime.    . cholecalciferol (VITAMIN D) 1000 units tablet Take 1,000 Units by mouth daily with supper.     . fluticasone (FLONASE) 50 MCG/ACT nasal spray Place 1 spray into both nostrils every 12 (twelve) hours as needed for allergies.     Marland Kitchen gabapentin (NEURONTIN) 100 MG capsule Take 200 mg by mouth 2 (two) times daily.     Marland Kitchen HYDROcodone-acetaminophen (NORCO/VICODIN)  5-325 MG tablet Take 1 tablet by mouth every 6 (six) hours as needed. Chronic Pain. Dx: G89.4 60 tablet 0  . LORazepam (ATIVAN) 1 MG tablet TAKE 1 TABLET BY MOUTH TWICE A DAY AS NEEDED FOR ANXIETY 60 tablet 0  . losartan (COZAAR) 50 MG tablet TAKE 1 TABLET BY MOUTH EVERY DAY 90 tablet 3  . meloxicam (MOBIC) 15 MG tablet Take 1 tablet (15 mg total) by mouth daily. 30 tablet 4  . Multiple Vitamin (MULTIVITAMIN WITH MINERALS) TABS tablet Take 1 tablet by mouth daily with supper.    . Multiple Vitamins-Minerals (ZINC PO) Take 1 tablet by mouth daily with supper.    Marland Kitchen omeprazole (PRILOSEC) 20 MG capsule TAKE 1 CAPSULE BY MOUTH TWICE A DAY 180 capsule 3  . oxybutynin (DITROPAN) 5 MG tablet Take 1 tablet (5 mg total) by mouth 2 (two) times daily. 60 tablet 1  . promethazine (PHENERGAN) 25 MG tablet TAKE 1 TABLET BY MOUTH EVERY 8 HOURS AS NEEDED FOR NAUSEA AND VOMITING 20 tablet 2   Current Facility-Administered Medications on File Prior to Visit  Medication Dose Route Frequency Provider Last Rate Last Admin  . denosumab (PROLIA) injection 60 mg  60 mg Subcutaneous Q6 months [name], MD  60 mg at 12/29/18 1442   Allergies  Allergen Reactions  . Sulfa Antibiotics Hives   Social History   Socioeconomic History  . Marital status: Widowed    Spouse name: Not on file  . Number of children: Not on file  . Years of education: Not on file  . Highest education level: Not on file  Occupational History  . Occupation: Retired  Tobacco Use  . Smoking status: Former Smoker    Packs/day: 0.50    Years: 5.00    Pack years: 2.50    Types: Cigarettes    Quit date: 04/01/1980    Years since quitting: 39.9  . Smokeless tobacco: Never Used  Substance and Sexual Activity  . Alcohol use: No    Alcohol/week: 0.0 standard drinks  . Drug use: No  . Sexual activity: Not on file  Other Topics Concern  . Not on file  Social History Narrative  . Not on file   Social Determinants of Health    Financial Resource Strain: Not on file  Food Insecurity: Not on file  Transportation Needs: Not on file  Physical Activity: Not on file  Stress: Not on file  Social Connections: Not on file  Intimate Partner Violence: Not on file      Review of Systems  All other systems reviewed and are negative.      Objective:   Physical Exam Vitals reviewed.  Constitutional:      Appearance: Normal appearance. She is normal weight.  HENT:     Right Ear: Tympanic membrane and ear canal normal.     Left Ear: Tympanic membrane and ear canal normal.     Nose: Nose normal. No congestion or rhinorrhea.     Mouth/Throat:     Mouth: Mucous membranes are moist.     Pharynx: No oropharyngeal exudate or posterior oropharyngeal erythema.  Eyes:     Conjunctiva/sclera: Conjunctivae normal.  Cardiovascular:     Rate and Rhythm: Normal rate and regular rhythm.     Pulses: Normal pulses.     Heart sounds: Normal heart sounds. No murmur heard.   Pulmonary:     Effort: Pulmonary effort is normal. No respiratory distress.     Breath sounds: Normal breath sounds. No wheezing, rhonchi or rales.  Abdominal:     General: Bowel sounds are normal.     Palpations: Abdomen is soft.  Musculoskeletal:     Left knee: No LCL laxity or MCL laxity.Normal meniscus.  Neurological:     Mental Status: She is alert.           Assessment & Plan:  Pure hypercholesterolemia - Plan: COMPLETE METABOLIC PANEL WITH GFR, Lipid panel  I will be glad to check the patient's cholesterol.  Check CMP and a fasting lipid panel.  If her 10-year risk of cardiovascular disease is greater than 7.5% I would recommend taking a statin.  Risk factors include age, hypertension.

## 2020-03-17 ENCOUNTER — Other Ambulatory Visit: Payer: Self-pay

## 2020-03-17 DIAGNOSIS — E7800 Pure hypercholesterolemia, unspecified: Secondary | ICD-10-CM

## 2020-03-17 LAB — COMPLETE METABOLIC PANEL WITH GFR
AG Ratio: 2.1 (calc) (ref 1.0–2.5)
ALT: 11 U/L (ref 6–29)
AST: 14 U/L (ref 10–35)
Albumin: 4.1 g/dL (ref 3.6–5.1)
Alkaline phosphatase (APISO): 123 U/L (ref 37–153)
BUN/Creatinine Ratio: 44 (calc) — ABNORMAL HIGH (ref 6–22)
BUN: 24 mg/dL (ref 7–25)
CO2: 28 mmol/L (ref 20–32)
Calcium: 9.1 mg/dL (ref 8.6–10.4)
Chloride: 104 mmol/L (ref 98–110)
Creat: 0.55 mg/dL — ABNORMAL LOW (ref 0.60–0.93)
GFR, Est African American: 105 mL/min/{1.73_m2} (ref >=60)
GFR, Est Non African American: 90 mL/min/{1.73_m2} (ref >=60)
Globulin: 2 g/dL (calc) (ref 1.9–3.7)
Glucose, Bld: 83 mg/dL (ref 65–99)
Potassium: 3.9 mmol/L (ref 3.5–5.3)
Sodium: 141 mmol/L (ref 135–146)
Total Bilirubin: 0.5 mg/dL (ref 0.2–1.2)
Total Protein: 6.1 g/dL (ref 6.1–8.1)

## 2020-03-17 LAB — LIPID PANEL
Cholesterol: 260 mg/dL — ABNORMAL HIGH (ref <200)
HDL: 70 mg/dL (ref >=50)
LDL Cholesterol (Calc): 170 mg/dL (calc) — ABNORMAL HIGH
Non-HDL Cholesterol (Calc): 190 mg/dL (calc) — ABNORMAL HIGH (ref <130)
Total CHOL/HDL Ratio: 3.7 (calc) (ref <5.0)
Triglycerides: 89 mg/dL (ref <150)

## 2020-03-17 MED ORDER — ROSUVASTATIN CALCIUM 10 MG PO TABS: 10.0000 mg | ORAL_TABLET | Freq: Every day | ORAL | 3 refills | Status: DC

## 2020-03-27 ENCOUNTER — Other Ambulatory Visit: Payer: Self-pay | Admitting: Family Medicine

## 2020-03-27 ENCOUNTER — Telehealth: Payer: Self-pay

## 2020-03-27 MED ORDER — AMOXICILLIN 875 MG PO TABS: 875.0000 mg | ORAL_TABLET | Freq: Two times a day (BID) | ORAL | 0 refills | Status: DC

## 2020-03-27 NOTE — Telephone Encounter (Signed)
Patient called asked if she could have a rx sent for her ear pain in  right ear

## 2020-03-27 NOTE — Telephone Encounter (Signed)
I sent in amoxicillin 875 mg pobid for 10 days for ear infection after discussing symptoms with patient.

## 2020-03-27 NOTE — Telephone Encounter (Signed)
Right Ear pain, pain goes across check bone

## 2020-03-28 DIAGNOSIS — J309 Allergic rhinitis, unspecified: Secondary | ICD-10-CM | POA: Diagnosis not present

## 2020-03-28 DIAGNOSIS — R5383 Other fatigue: Secondary | ICD-10-CM | POA: Diagnosis not present

## 2020-03-28 DIAGNOSIS — Z79899 Other long term (current) drug therapy: Secondary | ICD-10-CM | POA: Diagnosis not present

## 2020-03-28 DIAGNOSIS — Z131 Encounter for screening for diabetes mellitus: Secondary | ICD-10-CM | POA: Diagnosis not present

## 2020-03-28 DIAGNOSIS — E559 Vitamin D deficiency, unspecified: Secondary | ICD-10-CM | POA: Diagnosis not present

## 2020-03-28 DIAGNOSIS — F419 Anxiety disorder, unspecified: Secondary | ICD-10-CM | POA: Diagnosis not present

## 2020-03-28 DIAGNOSIS — Z9181 History of falling: Secondary | ICD-10-CM | POA: Diagnosis not present

## 2020-03-28 DIAGNOSIS — Z20822 Contact with and (suspected) exposure to covid-19: Secondary | ICD-10-CM | POA: Diagnosis not present

## 2020-03-28 DIAGNOSIS — M129 Arthropathy, unspecified: Secondary | ICD-10-CM | POA: Diagnosis not present

## 2020-03-28 DIAGNOSIS — E7800 Pure hypercholesterolemia, unspecified: Secondary | ICD-10-CM | POA: Diagnosis not present

## 2020-03-28 DIAGNOSIS — G4700 Insomnia, unspecified: Secondary | ICD-10-CM | POA: Diagnosis not present

## 2020-03-28 DIAGNOSIS — D539 Nutritional anemia, unspecified: Secondary | ICD-10-CM | POA: Diagnosis not present

## 2020-03-28 DIAGNOSIS — Z23 Encounter for immunization: Secondary | ICD-10-CM | POA: Diagnosis not present

## 2020-03-28 DIAGNOSIS — M5030 Other cervical disc degeneration, unspecified cervical region: Secondary | ICD-10-CM | POA: Diagnosis not present

## 2020-03-28 DIAGNOSIS — I10 Essential (primary) hypertension: Secondary | ICD-10-CM | POA: Diagnosis not present

## 2020-03-28 DIAGNOSIS — F32A Depression, unspecified: Secondary | ICD-10-CM | POA: Diagnosis not present

## 2020-04-12 DIAGNOSIS — Z9189 Other specified personal risk factors, not elsewhere classified: Secondary | ICD-10-CM | POA: Diagnosis not present

## 2020-04-12 DIAGNOSIS — F419 Anxiety disorder, unspecified: Secondary | ICD-10-CM | POA: Diagnosis not present

## 2020-04-12 DIAGNOSIS — F32A Depression, unspecified: Secondary | ICD-10-CM | POA: Diagnosis not present

## 2020-04-12 DIAGNOSIS — I10 Essential (primary) hypertension: Secondary | ICD-10-CM | POA: Diagnosis not present

## 2020-04-12 DIAGNOSIS — G4700 Insomnia, unspecified: Secondary | ICD-10-CM | POA: Diagnosis not present

## 2020-04-12 DIAGNOSIS — E7800 Pure hypercholesterolemia, unspecified: Secondary | ICD-10-CM | POA: Diagnosis not present

## 2020-04-12 DIAGNOSIS — Z9181 History of falling: Secondary | ICD-10-CM | POA: Diagnosis not present

## 2020-04-28 ENCOUNTER — Other Ambulatory Visit: Payer: Self-pay

## 2020-04-28 ENCOUNTER — Ambulatory Visit (INDEPENDENT_AMBULATORY_CARE_PROVIDER_SITE_OTHER): Payer: PPO | Admitting: Family Medicine

## 2020-04-28 VITALS — BP 138/80 | HR 84 | Temp 98.3°F | Resp 16 | Ht 60.0 in

## 2020-04-28 DIAGNOSIS — B029 Zoster without complications: Secondary | ICD-10-CM | POA: Diagnosis not present

## 2020-04-28 MED ORDER — VALACYCLOVIR HCL 1 G PO TABS: 1000.0000 mg | ORAL_TABLET | Freq: Three times a day (TID) | ORAL | 0 refills | Status: DC

## 2020-04-28 MED ORDER — HYDROCODONE-ACETAMINOPHEN 5-325 MG PO TABS: 1.0000 | ORAL_TABLET | Freq: Four times a day (QID) | ORAL | 0 refills | Status: DC | PRN

## 2020-04-28 NOTE — Progress Notes (Signed)
Subjective:    Patient ID: Christina Lyons, female    DOB: 01-23-1941, 80 y.o.   MRN: [id number]  Patient is a very sweet 80 year old Caucasian female who presents today concerned that she may have shingles.  She states that her skin started itching on Sunday but she saw nothing there.  Yesterday it began to hurt and when she looked at her skin this morning she saw a blistering red rash radiating from the center of her back roughly at the level of T10 radiating around her rib to just below her right breast stopping in the midline at the front.  The rash has the characteristic look of shingles.  It is clusters of yellow vesicles on an erythematous base in a linear dermatome  Past Medical History:  Diagnosis Date  . DDD (degenerative disc disease), lumbar   . GERD (gastroesophageal reflux disease)   . Hyperlipidemia   . Hypertension   . Osteomyelitis ([place]) 1946   both legs  . Osteoporosis   . Other idiopathic scoliosis, thoracolumbar region    Past Surgical History:  Procedure Laterality Date  . ABDOMINAL HYSTERECTOMY    . CHOLECYSTECTOMY    . JOINT REPLACEMENT     right knee   Current Outpatient Medications on File Prior to Visit  Medication Sig Dispense Refill  . amoxicillin (AMOXIL) 875 MG tablet Take 1 tablet (875 mg total) by mouth 2 (two) times daily. 20 tablet 0  . Ascorbic Acid (VITAMIN C PO) Take 1 tablet by mouth daily with supper.    Marland Kitchen aspirin EC 81 MG tablet Take 81 mg by mouth at bedtime.     Marland Kitchen b complex vitamins tablet Take 1 tablet by mouth daily with supper.    . cetirizine (ZYRTEC) 10 MG tablet Take 10 mg by mouth at bedtime.    . cholecalciferol (VITAMIN D) 1000 units tablet Take 1,000 Units by mouth daily with supper.     . fluticasone (FLONASE) 50 MCG/ACT nasal spray Place 1 spray into both nostrils every 12 (twelve) hours as needed for allergies.     Marland Kitchen gabapentin (NEURONTIN) 100 MG capsule Take 200 mg by mouth 2 (two) times daily.     Marland Kitchen HYDROcodone-acetaminophen  (NORCO/VICODIN) 5-325 MG tablet Take 1 tablet by mouth every 6 (six) hours as needed. Chronic Pain. Dx: G89.4 60 tablet 0  . LORazepam (ATIVAN) 1 MG tablet TAKE 1 TABLET BY MOUTH TWICE A DAY AS NEEDED FOR ANXIETY 60 tablet 0  . losartan (COZAAR) 50 MG tablet TAKE 1 TABLET BY MOUTH EVERY DAY 90 tablet 3  . meloxicam (MOBIC) 15 MG tablet Take 1 tablet (15 mg total) by mouth daily. 30 tablet 4  . Multiple Vitamin (MULTIVITAMIN WITH MINERALS) TABS tablet Take 1 tablet by mouth daily with supper.    . Multiple Vitamins-Minerals (ZINC PO) Take 1 tablet by mouth daily with supper.    Marland Kitchen omeprazole (PRILOSEC) 20 MG capsule TAKE 1 CAPSULE BY MOUTH TWICE A DAY 180 capsule 3  . oxybutynin (DITROPAN) 5 MG tablet Take 1 tablet (5 mg total) by mouth 2 (two) times daily. 60 tablet 1  . promethazine (PHENERGAN) 25 MG tablet TAKE 1 TABLET BY MOUTH EVERY 8 HOURS AS NEEDED FOR NAUSEA AND VOMITING 20 tablet 2  . rosuvastatin (CRESTOR) 10 MG tablet Take 1 tablet (10 mg total) by mouth daily. 90 tablet 3   Current Facility-Administered Medications on File Prior to Visit  Medication Dose Route Frequency Provider Last Rate Last Admin  .  denosumab (PROLIA) injection 60 mg  60 mg Subcutaneous Q6 months [name], MD   60 mg at 12/29/18 1442   Allergies  Allergen Reactions  . Sulfa Antibiotics Hives   Social History   Socioeconomic History  . Marital status: Widowed    Spouse name: Not on file  . Number of children: Not on file  . Years of education: Not on file  . Highest education level: Not on file  Occupational History  . Occupation: Retired  Tobacco Use  . Smoking status: Former Smoker    Packs/day: 0.50    Years: 5.00    Pack years: 2.50    Types: Cigarettes    Quit date: 04/01/1980    Years since quitting: 40.1  . Smokeless tobacco: Never Used  Substance and Sexual Activity  . Alcohol use: No    Alcohol/week: 0.0 standard drinks  . Drug use: No  . Sexual activity: Not on file  Other  Topics Concern  . Not on file  Social History Narrative  . Not on file   Social Determinants of Health   Financial Resource Strain: Not on file  Food Insecurity: Not on file  Transportation Needs: Not on file  Physical Activity: Not on file  Stress: Not on file  Social Connections: Not on file  Intimate Partner Violence: Not on file      Review of Systems  All other systems reviewed and are negative.      Objective:   Physical Exam Vitals reviewed.  Constitutional:      Appearance: Normal appearance. She is normal weight.  Cardiovascular:     Rate and Rhythm: Normal rate and regular rhythm.     Pulses: Normal pulses.     Heart sounds: Normal heart sounds. No murmur heard.   Pulmonary:     Effort: Pulmonary effort is normal. No respiratory distress.     Breath sounds: Normal breath sounds. No wheezing, rhonchi or rales.    Chest:    Abdominal:     General: Bowel sounds are normal.     Palpations: Abdomen is soft.  Musculoskeletal:     Left knee: No LCL laxity or MCL laxity.Normal meniscus.  Skin:    Findings: Erythema and rash present. Rash is vesicular.  Neurological:     Mental Status: She is alert.           Assessment & Plan:  Herpes zoster without complication  Begin Valtrex 1000 mg p.o. 3 times daily for 7 days.  Contact precautions discussed.  Use hydrocodone 5/325 1 p.o. every 6 hours as needed pain.  I went ahead and refilled her pain medication early due to the fact we have a pending snowstorm and she would have a difficult time getting out of the house this weekend to get a medication.

## 2020-06-08 ENCOUNTER — Ambulatory Visit (INDEPENDENT_AMBULATORY_CARE_PROVIDER_SITE_OTHER): Payer: PPO | Admitting: Family Medicine

## 2020-06-08 ENCOUNTER — Encounter: Payer: Self-pay | Admitting: Family Medicine

## 2020-06-08 ENCOUNTER — Other Ambulatory Visit: Payer: Self-pay

## 2020-06-08 VITALS — BP 138/82 | HR 68 | Temp 98.7°F | Resp 14 | Ht 60.0 in | Wt 218.0 lb

## 2020-06-08 DIAGNOSIS — M25562 Pain in left knee: Secondary | ICD-10-CM

## 2020-06-08 DIAGNOSIS — G8929 Other chronic pain: Secondary | ICD-10-CM

## 2020-06-08 DIAGNOSIS — S8002XA Contusion of left knee, initial encounter: Secondary | ICD-10-CM

## 2020-06-08 MED ORDER — PREDNISONE 20 MG PO TABS: ORAL_TABLET | ORAL | 0 refills | Status: DC

## 2020-06-08 NOTE — Progress Notes (Signed)
Subjective:    Patient ID: Christina Lyons, female    DOB: 28-Dec-1940, 80 y.o.   MRN: [id number]  Knee Pain    Patient presents today complaining of pain in her left knee.  Patient has a history of osteoarthritis in the left knee.  We have given her cortisone injections in the left knee before.  She was doing well with meloxicam after the cortisone injection until 2 weeks ago.  She lost her balance and fell on her wooden deck.  She fell from a standing position down to her left knee striking the lower portion of the patella against the wooden deck.  She developed a hematoma just inferior and lateral to the patella immediately at thereafter.  The skin in that area is slightly yellow and discolored with some small bruising.  However there is bruising tracking down the anterior left shin to the mid shin that I believe is blood from the original hematoma that formed just below the patella.  She has crepitus on range of motion in the left knee but no pain with range of motion.  She is tender to palpation over the hematoma along the shin and just below the patella however there is no palpable deformity or fracture.  She denies any instability in the knee joint or laxity to varus or valgus stress Past Medical History:  Diagnosis Date  . DDD (degenerative disc disease), lumbar   . GERD (gastroesophageal reflux disease)   . Hyperlipidemia   . Hypertension   . Osteomyelitis ([place]) 1946   both legs  . Osteoporosis   . Other idiopathic scoliosis, thoracolumbar region    Past Surgical History:  Procedure Laterality Date  . ABDOMINAL HYSTERECTOMY    . CHOLECYSTECTOMY    . JOINT REPLACEMENT     right knee   Current Outpatient Medications on File Prior to Visit  Medication Sig Dispense Refill  . Ascorbic Acid (VITAMIN C PO) Take 1 tablet by mouth daily with supper.    Marland Kitchen aspirin EC 81 MG tablet Take 81 mg by mouth at bedtime.     Marland Kitchen b complex vitamins tablet Take 1 tablet by mouth daily with supper.     . cetirizine (ZYRTEC) 10 MG tablet Take 10 mg by mouth at bedtime.    . cholecalciferol (VITAMIN D) 1000 units tablet Take 1,000 Units by mouth daily with supper.     . fluticasone (FLONASE) 50 MCG/ACT nasal spray Place 1 spray into both nostrils every 12 (twelve) hours as needed for allergies.     Marland Kitchen gabapentin (NEURONTIN) 100 MG capsule Take 200 mg by mouth 2 (two) times daily.     Marland Kitchen HYDROcodone-acetaminophen (NORCO/VICODIN) 5-325 MG tablet Take 1 tablet by mouth every 6 (six) hours as needed. Chronic Pain. Dx: G89.4 60 tablet 0  . LORazepam (ATIVAN) 1 MG tablet TAKE 1 TABLET BY MOUTH TWICE A DAY AS NEEDED FOR ANXIETY 60 tablet 0  . losartan (COZAAR) 50 MG tablet TAKE 1 TABLET BY MOUTH EVERY DAY 90 tablet 3  . meloxicam (MOBIC) 15 MG tablet Take 1 tablet (15 mg total) by mouth daily. 30 tablet 4  . Multiple Vitamin (MULTIVITAMIN WITH MINERALS) TABS tablet Take 1 tablet by mouth daily with supper.    . Multiple Vitamins-Minerals (ZINC PO) Take 1 tablet by mouth daily with supper.    Marland Kitchen omeprazole (PRILOSEC) 20 MG capsule TAKE 1 CAPSULE BY MOUTH TWICE A DAY 180 capsule 3  . oxybutynin (DITROPAN) 5 MG tablet Take 1  tablet (5 mg total) by mouth 2 (two) times daily. 60 tablet 1  . promethazine (PHENERGAN) 25 MG tablet TAKE 1 TABLET BY MOUTH EVERY 8 HOURS AS NEEDED FOR NAUSEA AND VOMITING 20 tablet 2  . rosuvastatin (CRESTOR) 10 MG tablet Take 1 tablet (10 mg total) by mouth daily. 90 tablet 3  . valACYclovir (VALTREX) 1000 MG tablet Take 1 tablet (1,000 mg total) by mouth 3 (three) times daily. (Patient not taking: Reported on 06/08/2020) 21 tablet 0   Current Facility-Administered Medications on File Prior to Visit  Medication Dose Route Frequency Provider Last Rate Last Admin  . denosumab (PROLIA) injection 60 mg  60 mg Subcutaneous Q6 months [name], MD   60 mg at 12/29/18 1442   Allergies  Allergen Reactions  . Sulfa Antibiotics Hives   Social History   Socioeconomic History  .  Marital status: Widowed    Spouse name: Not on file  . Number of children: Not on file  . Years of education: Not on file  . Highest education level: Not on file  Occupational History  . Occupation: Retired  Tobacco Use  . Smoking status: Former Smoker    Packs/day: 0.50    Years: 5.00    Pack years: 2.50    Types: Cigarettes    Quit date: 04/01/1980    Years since quitting: 40.2  . Smokeless tobacco: Never Used  Substance and Sexual Activity  . Alcohol use: No    Alcohol/week: 0.0 standard drinks  . Drug use: No  . Sexual activity: Not on file  Other Topics Concern  . Not on file  Social History Narrative  . Not on file   Social Determinants of Health   Financial Resource Strain: Not on file  Food Insecurity: Not on file  Transportation Needs: Not on file  Physical Activity: Not on file  Stress: Not on file  Social Connections: Not on file  Intimate Partner Violence: Not on file      Review of Systems  All other systems reviewed and are negative.      Objective:   Physical Exam Vitals reviewed.  Constitutional:      Appearance: Normal appearance. She is normal weight.  Cardiovascular:     Rate and Rhythm: Normal rate and regular rhythm.  Pulmonary:     Effort: Pulmonary effort is normal.     Breath sounds: Normal breath sounds.  Musculoskeletal:     Left knee: Swelling present. Decreased range of motion. Tenderness present over the medial joint line and lateral joint line. No LCL laxity or MCL laxity.Normal meniscus.       Legs:  Neurological:     Mental Status: She is alert.           Assessment & Plan:  Chronic pain of left knee  Traumatic hematoma of left knee, initial encounter  The knee joint is stable without any evidence of laxity.  There is no effusion although there is some bruising around the patella.  There is some mild tenderness with range of motion however the patient is able to bear weight without any difficulty.  I believe that  she has underlying osteoarthritis of the left knee and now a traumatic hematoma.  I recommended stopping the meloxicam temporarily and using prednisone taper pack.  She is apply ice for 5 to 10 minutes 2-3 times a day to help control the swelling and stay off the knee is much as possible over the weekend.  Reassess in  1 week if no better or sooner if worse.  I do not feel that there is any fracture

## 2020-06-13 DIAGNOSIS — H5032 Intermittent alternating esotropia: Secondary | ICD-10-CM | POA: Diagnosis not present

## 2020-06-13 DIAGNOSIS — H5021 Vertical strabismus, right eye: Secondary | ICD-10-CM | POA: Diagnosis not present

## 2020-06-23 ENCOUNTER — Other Ambulatory Visit: Payer: Self-pay

## 2020-06-23 ENCOUNTER — Other Ambulatory Visit: Payer: Self-pay | Admitting: *Deleted

## 2020-06-23 ENCOUNTER — Other Ambulatory Visit: Payer: PPO

## 2020-06-23 ENCOUNTER — Telehealth: Payer: Self-pay | Admitting: *Deleted

## 2020-06-23 DIAGNOSIS — Z13220 Encounter for screening for lipoid disorders: Secondary | ICD-10-CM

## 2020-06-23 DIAGNOSIS — E7800 Pure hypercholesterolemia, unspecified: Secondary | ICD-10-CM

## 2020-06-23 DIAGNOSIS — H509 Unspecified strabismus: Secondary | ICD-10-CM

## 2020-06-23 DIAGNOSIS — I10 Essential (primary) hypertension: Secondary | ICD-10-CM

## 2020-06-23 NOTE — Telephone Encounter (Signed)
Patient brought in prescription for labs from [facility] for: 1. Serum acetylcholine receptor antibodies- binding, blocking and modulating 2. Serum TSH Dx: Acquired strabismus, R/O myasthenia gravis and hypothyroidism.  Ok to order and fax results to  [facility] [phone] telephone [phone]~ fax

## 2020-06-23 NOTE — Telephone Encounter (Signed)
Orders placed.

## 2020-06-23 NOTE — Telephone Encounter (Signed)
ok 

## 2020-06-26 ENCOUNTER — Other Ambulatory Visit: Payer: PPO

## 2020-06-30 ENCOUNTER — Other Ambulatory Visit: Payer: PPO

## 2020-06-30 ENCOUNTER — Other Ambulatory Visit: Payer: Self-pay

## 2020-06-30 DIAGNOSIS — Z136 Encounter for screening for cardiovascular disorders: Secondary | ICD-10-CM | POA: Diagnosis not present

## 2020-06-30 DIAGNOSIS — H509 Unspecified strabismus: Secondary | ICD-10-CM

## 2020-06-30 DIAGNOSIS — Z13220 Encounter for screening for lipoid disorders: Secondary | ICD-10-CM

## 2020-06-30 DIAGNOSIS — I10 Essential (primary) hypertension: Secondary | ICD-10-CM

## 2020-06-30 DIAGNOSIS — E7800 Pure hypercholesterolemia, unspecified: Secondary | ICD-10-CM | POA: Diagnosis not present

## 2020-07-03 ENCOUNTER — Other Ambulatory Visit: Payer: Self-pay | Admitting: Family Medicine

## 2020-07-03 ENCOUNTER — Encounter: Payer: Self-pay | Admitting: *Deleted

## 2020-07-03 MED ORDER — HYDROCODONE-ACETAMINOPHEN 5-325 MG PO TABS: 1.0000 | ORAL_TABLET | Freq: Four times a day (QID) | ORAL | 0 refills | Status: DC | PRN

## 2020-07-03 NOTE — Telephone Encounter (Signed)
Patient called to request refill of   HYDROcodone-acetaminophen (NORCO/VICODIN) 5-325 MG tablet [[id number]]   Pharmacy:   [id number] [place], [place] - [address]  [address] [place] [address]  Phone:  [phone] Fax:  [phone]  DEA #:  [id number]  Patient has one pill left. Please advise patient when Rx has been called in at [phone].

## 2020-07-03 NOTE — Telephone Encounter (Signed)
Ok to refill??  Last office visit 06/08/2020.  Last refill 04/28/2020.

## 2020-07-12 LAB — ACETYLCHOLINE RECEPTOR, MODULATING: Acetylchol Modul Ab: 10 % Inhibition

## 2020-07-12 LAB — COMPLETE METABOLIC PANEL WITH GFR
AG Ratio: 2 (calc) (ref 1.0–2.5)
ALT: 10 U/L (ref 6–29)
AST: 16 U/L (ref 10–35)
Albumin: 4.1 g/dL (ref 3.6–5.1)
Alkaline phosphatase (APISO): 80 U/L (ref 37–153)
BUN/Creatinine Ratio: 44 (calc) — ABNORMAL HIGH (ref 6–22)
BUN: 27 mg/dL — ABNORMAL HIGH (ref 7–25)
CO2: 23 mmol/L (ref 20–32)
Calcium: 9.3 mg/dL (ref 8.6–10.4)
Chloride: 106 mmol/L (ref 98–110)
Creat: 0.61 mg/dL (ref 0.60–0.93)
GFR, Est African American: 101 mL/min/{1.73_m2} (ref >=60)
GFR, Est Non African American: 87 mL/min/{1.73_m2} (ref >=60)
Globulin: 2.1 g/dL (calc) (ref 1.9–3.7)
Glucose, Bld: 88 mg/dL (ref 65–99)
Potassium: 4.2 mmol/L (ref 3.5–5.3)
Sodium: 143 mmol/L (ref 135–146)
Total Bilirubin: 0.5 mg/dL (ref 0.2–1.2)
Total Protein: 6.2 g/dL (ref 6.1–8.1)

## 2020-07-12 LAB — LIPID PANEL
Cholesterol: 177 mg/dL (ref <200)
HDL: 79 mg/dL (ref >=50)
LDL Cholesterol (Calc): 83 mg/dL (calc)
Non-HDL Cholesterol (Calc): 98 mg/dL (calc) (ref <130)
Total CHOL/HDL Ratio: 2.2 (calc) (ref <5.0)
Triglycerides: 73 mg/dL (ref <150)

## 2020-07-12 LAB — CBC WITH DIFFERENTIAL/PLATELET
Absolute Monocytes: 365 cells/uL (ref 200–950)
Basophils Absolute: 50 cells/uL (ref 0–200)
Basophils Relative: 1.1 %
Eosinophils Absolute: 162 cells/uL (ref 15–500)
Eosinophils Relative: 3.6 %
HCT: 41.4 % (ref 35.0–45.0)
Hemoglobin: 13.9 g/dL (ref 11.7–15.5)
Lymphs Abs: 1517 cells/uL (ref 850–3900)
MCH: 33.4 pg — ABNORMAL HIGH (ref 27.0–33.0)
MCHC: 33.6 g/dL (ref 32.0–36.0)
MCV: 99.5 fL (ref 80.0–100.0)
MPV: 10.7 fL (ref 7.5–12.5)
Monocytes Relative: 8.1 %
Neutro Abs: 2408 cells/uL (ref 1500–7800)
Neutrophils Relative %: 53.5 %
Platelets: 145 10*3/uL (ref 140–400)
RBC: 4.16 10*6/uL (ref 3.80–5.10)
RDW: 12.5 % (ref 11.0–15.0)
Total Lymphocyte: 33.7 %
WBC: 4.5 10*3/uL (ref 3.8–10.8)

## 2020-07-12 LAB — ACETYLCHOLINE RECEPTOR, BLOCKING: ACHR Blocking Abs: <15 % Inhibition (ref <15)

## 2020-07-12 LAB — ACETYLCHOLINE RECEPTOR, BINDING: A CHR BINDING ABS: <0.3 nmol/L

## 2020-07-12 LAB — TSH: TSH: 4 mIU/L (ref 0.40–4.50)

## 2020-07-29 IMAGING — MR MR HEAD W/O CM
12 of 13 series · 44 of 48 positions shown · non-contrast
Comparison: 06/22/2017

CLINICAL DATA: Vertigo

EXAM:
MRI HEAD WITHOUT CONTRAST
TECHNIQUE: Multiplanar, multiecho pulse sequences of the brain and surrounding
structures were obtained without intravenous contrast.

[Series 5: DWI · axial · 3.0mm · 0.88mm/px · z∈[-99,+51]mm · 9 of 104 slices shown (1 of 4)]
[im 1/104]
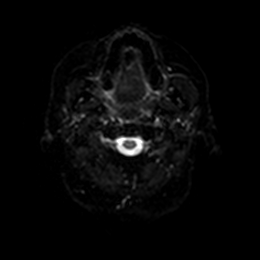
[im 13/104]
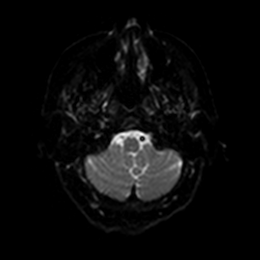
[im 26/104]
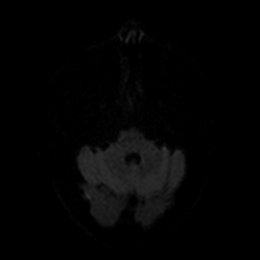
[im 39/104]
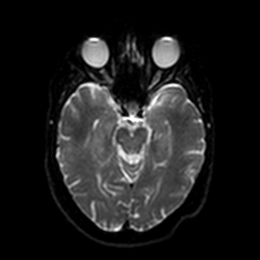
[im 52/104]
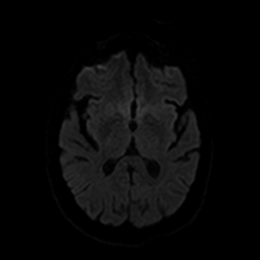
[im 65/104]
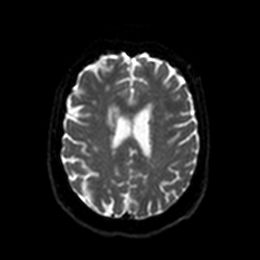
[im 78/104]
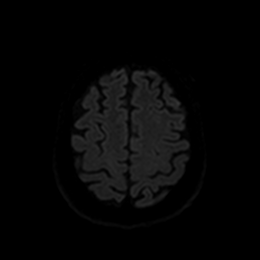
[im 91/104]
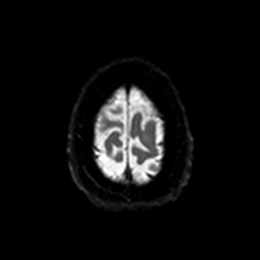
[im 104/104]
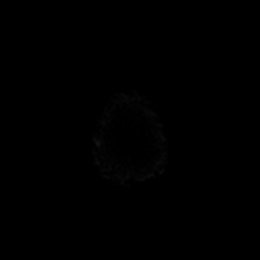

[Series 6: DWI · axial · 3.0mm · 0.88mm/px · z∈[-99,+51]mm · 4 of 52 slices shown (2 of 4)]
[im 1/52]
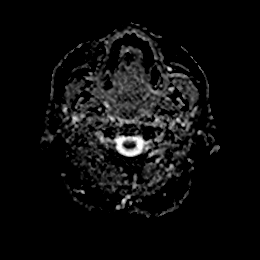
[im 18/52]
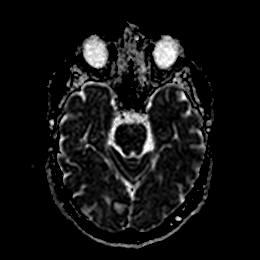
[im 35/52]
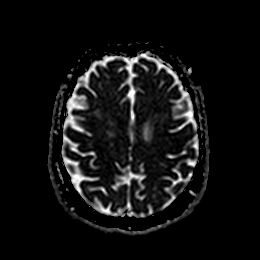
[im 52/52]
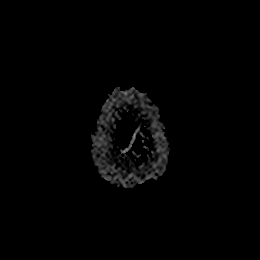

[Series 7: DWI · coronal · 4.0mm · 0.88mm/px · 5 of 68 slices shown (3 of 4)]
[im 1/68]
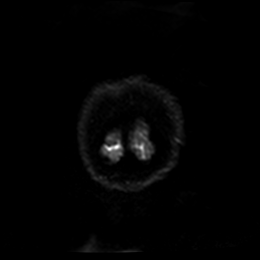
[im 17/68]
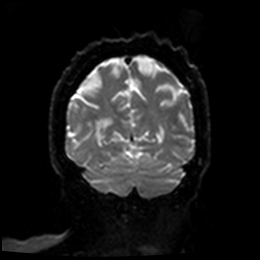
[im 34/68]
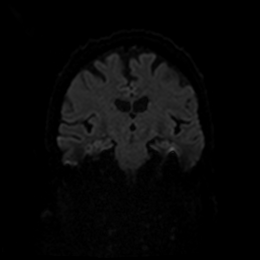
[im 51/68]
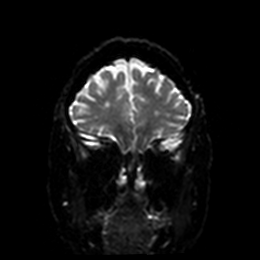
[im 68/68]
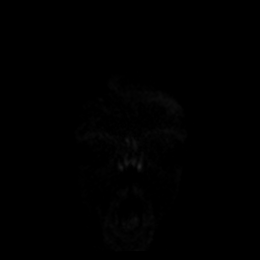

[Series 8: DWI · coronal · 4.0mm · 0.88mm/px · 2 of 34 slices shown (4 of 4)]
[im 1/34]
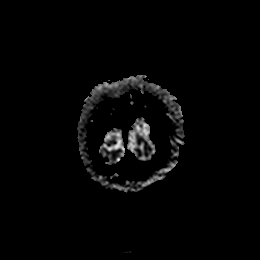
[im 34/34]
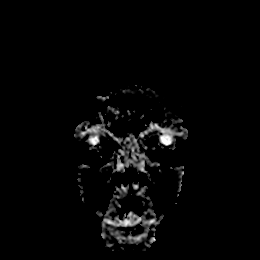

[Series 9: T1 · sagittal · 5.0mm · 0.75mm/px · 2 of 23 slices shown]
[im 1/23]
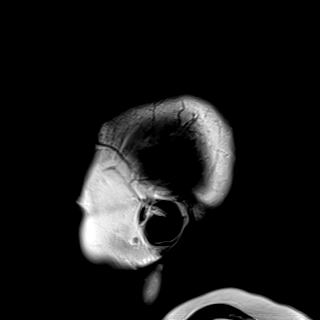
[im 23/23]
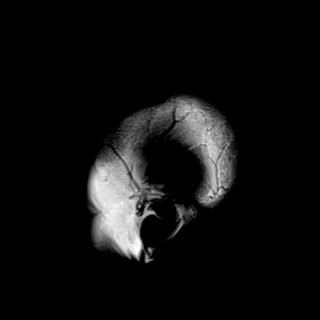

[Series 10: T2 · axial · 5.0mm · 0.72mm/px · z∈[-100,+52]mm · 2 of 27 slices shown (1 of 2)]
[im 1/27]
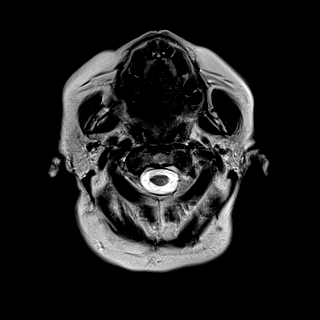
[im 27/27]
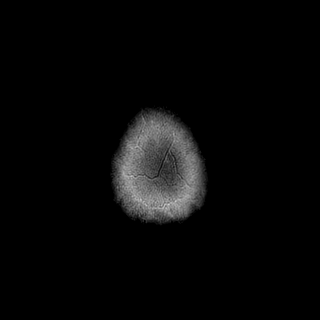

[Series 11: FLAIR · axial · 5.0mm · 0.45mm/px · z∈[-97,+55]mm · 2 of 27 slices shown]
[im 1/27]
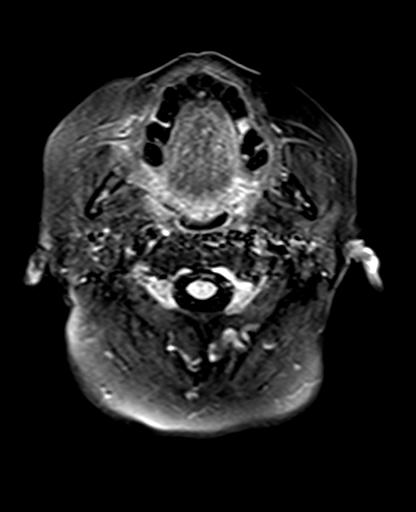
[im 27/27]
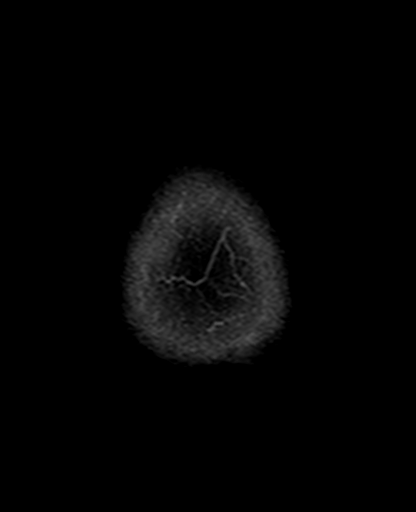

[Series 12: mag_images · axial · 3.0mm · 0.90mm/px · z∈[-97,+73]mm · 4 of 60 slices shown]
[im 1/60]
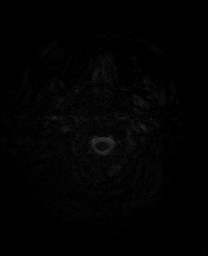
[im 20/60]
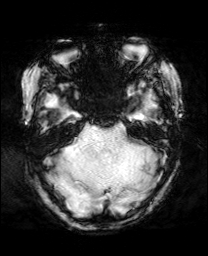
[im 40/60]
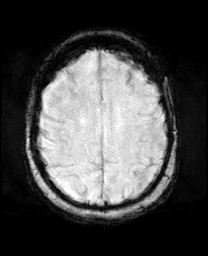
[im 60/60]
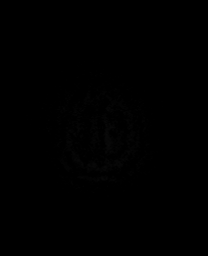

[Series 13: pha_images · axial · 3.0mm · 0.90mm/px · z∈[-97,+73]mm · 4 of 59 slices shown]
[im 1/59]
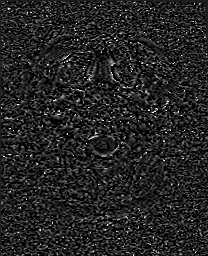
[im 20/59]
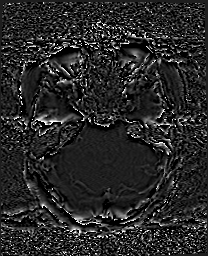
[im 39/59]
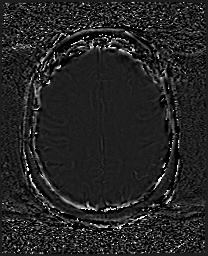
[im 59/59]
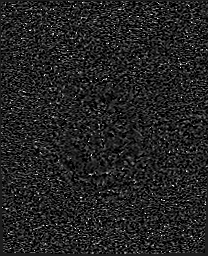

[Series 14: swi_images · axial · 3.0mm · 0.90mm/px · z∈[-97,+73]mm · 4 of 60 slices shown]
[im 1/60]
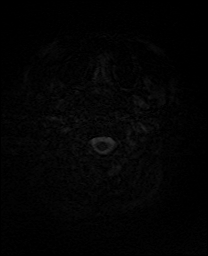
[im 20/60]
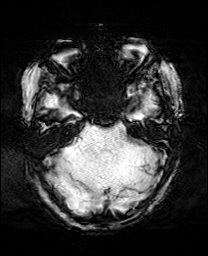
[im 40/60]
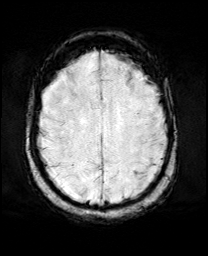
[im 60/60]
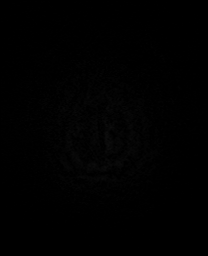

[Series 15: mip_images(sw) · axial · 24.0mm · 0.90mm/px · z∈[-87,+63]mm · 4 of 53 slices shown]
[im 1/53]
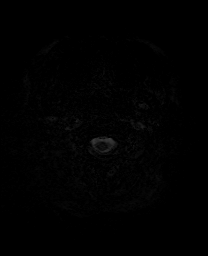
[im 18/53]
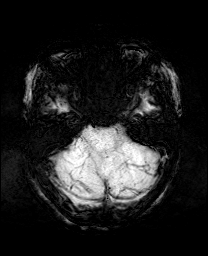
[im 35/53]
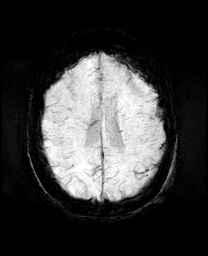
[im 53/53]
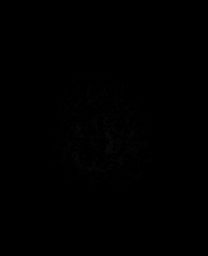

[Series 17: T2 · coronal · 5.0mm · 0.34mm/px · 2 of 29 slices shown (2 of 2)]
[im 1/29]
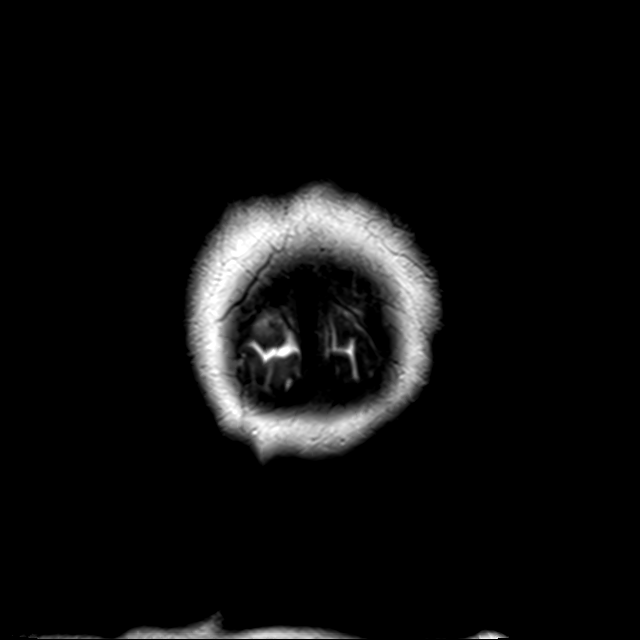
[im 29/29]
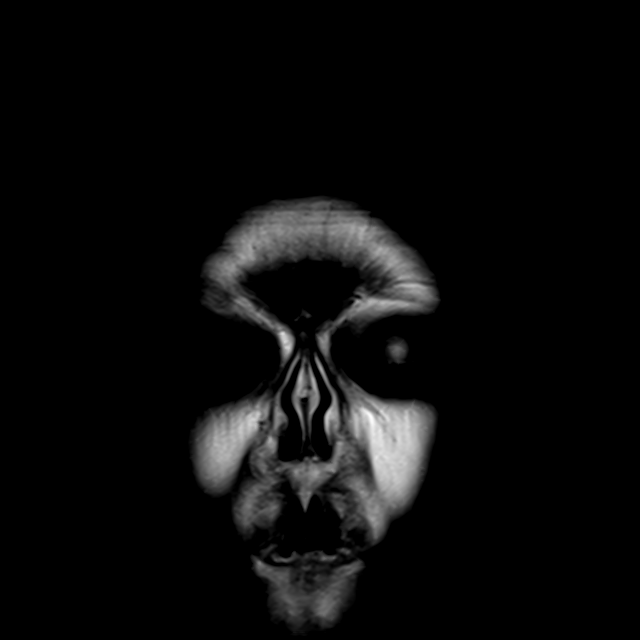

[44 of 48 positions shown; findings below may reference images not displayed]

FINDINGS: Brain: There is no acute infarction or intracranial hemorrhage.
There is no intracranial mass, mass effect, or edema. There is no
hydrocephalus or extra-axial fluid collection. Patchy and confluent
areas of T2 hyperintensity in the supratentorial much greater than
pontine white matter are nonspecific but may reflect mild to
moderate chronic microvascular ischemic changes. Incidental note is
made of a right parietooccipital developmental venous anomaly.

Vascular: Major vessel flow voids at the skull base are preserved.

Skull and upper cervical spine: Normal marrow signal is preserved.

Sinuses/Orbits: Paranasal sinuses are aerated. Bilateral lens
replacements.

Other: Sella is unremarkable.  Mastoid air cells are clear.
IMPRESSION: No evidence of acute infarction, hemorrhage, or mass. Stable chronic
microvascular ischemic changes.

## 2020-08-02 ENCOUNTER — Other Ambulatory Visit: Payer: Self-pay

## 2020-08-02 ENCOUNTER — Telehealth: Payer: Self-pay | Admitting: Family Medicine

## 2020-08-02 ENCOUNTER — Telehealth: Payer: Self-pay | Admitting: Nurse Practitioner

## 2020-08-02 DIAGNOSIS — N6310 Unspecified lump in the right breast, unspecified quadrant: Secondary | ICD-10-CM | POA: Diagnosis not present

## 2020-08-02 DIAGNOSIS — N63 Unspecified lump in unspecified breast: Secondary | ICD-10-CM

## 2020-08-02 DIAGNOSIS — R928 Other abnormal and inconclusive findings on diagnostic imaging of breast: Secondary | ICD-10-CM | POA: Diagnosis not present

## 2020-08-02 NOTE — Telephone Encounter (Signed)
Pt called this morning stating that she found a lump in her breast and immediately made an appt with [facility] to get a mammogram without getting a referral from her pcp. [facility] did the exam, and she is now needing to be scheduled for a biopsy.  Cb#: [phone]

## 2020-08-02 NOTE — Telephone Encounter (Signed)
Done

## 2020-08-02 NOTE — Telephone Encounter (Signed)
Erroneous encounter

## 2020-08-03 NOTE — Progress Notes (Deleted)
Subjective:   Christina Lyons is a 80 y.o. female who presents for Medicare Annual (Subsequent) preventive examination.  I connected with Christina Lyons  today by telephone and verified that I am speaking with the correct person using two identifiers. Location patient: home Location provider: work Persons participating in the virtual visit: patient, provider.   I discussed the limitations, risks, security and privacy concerns of performing an evaluation and management service by telephone and the availability of in person appointments. I also discussed with the patient that there may be a patient responsible charge related to this service. The patient expressed understanding and verbally consented to this telephonic visit.    Interactive audio and video telecommunications were attempted between this provider and patient, however failed, due to patient having technical difficulties OR patient did not have access to video capability.  We continued and completed visit with audio only.      Review of Systems    N/A        Objective:    There were no vitals filed for this visit. There is no height or weight on file to calculate BMI.  Advanced Directives 04/17/2019  Does Patient Have a Medical Advance Directive? Yes  Type of Advance Directive Living will  Does patient want to make changes to medical advance directive? No - Patient declined    Current Medications (verified) Outpatient Encounter Medications as of 08/04/2020  Medication Sig  . Ascorbic Acid (VITAMIN C PO) Take 1 tablet by mouth daily with supper.  Marland Kitchen aspirin EC 81 MG tablet Take 81 mg by mouth at bedtime.   Marland Kitchen b complex vitamins tablet Take 1 tablet by mouth daily with supper.  . cetirizine (ZYRTEC) 10 MG tablet Take 10 mg by mouth at bedtime.  . cholecalciferol (VITAMIN D) 1000 units tablet Take 1,000 Units by mouth daily with supper.   . fluticasone (FLONASE) 50 MCG/ACT nasal spray Place 1 spray into both nostrils every  12 (twelve) hours as needed for allergies.   Marland Kitchen gabapentin (NEURONTIN) 100 MG capsule Take 200 mg by mouth 2 (two) times daily.   Marland Kitchen HYDROcodone-acetaminophen (NORCO/VICODIN) 5-325 MG tablet Take 1 tablet by mouth every 6 (six) hours as needed. Chronic Pain. Dx: G89.4  . LORazepam (ATIVAN) 1 MG tablet TAKE 1 TABLET BY MOUTH TWICE A DAY AS NEEDED FOR ANXIETY  . losartan (COZAAR) 50 MG tablet TAKE 1 TABLET BY MOUTH EVERY DAY  . meloxicam (MOBIC) 15 MG tablet Take 1 tablet (15 mg total) by mouth daily.  . Multiple Vitamin (MULTIVITAMIN WITH MINERALS) TABS tablet Take 1 tablet by mouth daily with supper.  . Multiple Vitamins-Minerals (ZINC PO) Take 1 tablet by mouth daily with supper.  Marland Kitchen omeprazole (PRILOSEC) 20 MG capsule TAKE 1 CAPSULE BY MOUTH TWICE A DAY  . oxybutynin (DITROPAN) 5 MG tablet Take 1 tablet (5 mg total) by mouth 2 (two) times daily.  . predniSONE (DELTASONE) 20 MG tablet 3 tabs poqday 1-2, 2 tabs poqday 3-4, 1 tab poqday 5-6  . promethazine (PHENERGAN) 25 MG tablet TAKE 1 TABLET BY MOUTH EVERY 8 HOURS AS NEEDED FOR NAUSEA AND VOMITING  . rosuvastatin (CRESTOR) 10 MG tablet Take 1 tablet (10 mg total) by mouth daily.  . valACYclovir (VALTREX) 1000 MG tablet Take 1 tablet (1,000 mg total) by mouth 3 (three) times daily. (Patient not taking: Reported on 06/08/2020)   Facility-Administered Encounter Medications as of 08/04/2020  Medication  . denosumab (PROLIA) injection 60 mg    Allergies (  verified) Sulfa antibiotics   History: Past Medical History:  Diagnosis Date  . DDD (degenerative disc disease), lumbar   . GERD (gastroesophageal reflux disease)   . Hyperlipidemia   . Hypertension   . Osteomyelitis ([place]) 1946   both legs  . Osteoporosis   . Other idiopathic scoliosis, thoracolumbar region    Past Surgical History:  Procedure Laterality Date  . ABDOMINAL HYSTERECTOMY    . CHOLECYSTECTOMY    . JOINT REPLACEMENT     right knee   Family History  Problem Relation Age  of Onset  . Asthma Mother   . Asthma Sister   . Heart failure Father   . Breast cancer Sister   . Breast cancer Maternal Aunt    Social History   Socioeconomic History  . Marital status: Widowed    Spouse name: Not on file  . Number of children: Not on file  . Years of education: Not on file  . Highest education level: Not on file  Occupational History  . Occupation: Retired  Tobacco Use  . Smoking status: Former Smoker    Packs/day: 0.50    Years: 5.00    Pack years: 2.50    Types: Cigarettes    Quit date: 04/01/1980    Years since quitting: 40.3  . Smokeless tobacco: Never Used  Substance and Sexual Activity  . Alcohol use: No    Alcohol/week: 0.0 standard drinks  . Drug use: No  . Sexual activity: Not on file  Other Topics Concern  . Not on file  Social History Narrative  . Not on file   Social Determinants of Health   Financial Resource Strain: Not on file  Food Insecurity: Not on file  Transportation Needs: Not on file  Physical Activity: Not on file  Stress: Not on file  Social Connections: Not on file    Tobacco Counseling Counseling given: Not Answered   Clinical Intake:                 Diabetic?No          Activities of Daily Living No flowsheet data found.  Patient Care Team: [name], MD as PCP - General (Family Medicine)  Indicate any recent Medical Services you may have received from other than [facility] providers in the past year (date may be approximate).     Assessment:   This is a routine wellness examination for Christina Lyons.  Hearing/Vision screen No exam data present  Dietary issues and exercise activities discussed:    Goals Addressed   None    Depression Screen PHQ 2/9 Scores 06/08/2020 05/22/2017 05/22/2017 01/02/2017 01/16/2015  PHQ - 2 Score 0 0 0 0 0  PHQ- 9 Score - - - - 0    Fall Risk Fall Risk  06/08/2020 04/28/2020 10/29/2018 05/22/2017 05/22/2017  Falls in the past year? 1 0 0 No No  Comment - - Emmi  Telephone Survey: data to providers prior to load - -  Number falls in past yr: 0 0 - - -  Injury with Fall? 1 0 - - -  Risk for fall due to : Impaired balance/gait No Fall Risks - - -  Follow up Falls evaluation completed Falls evaluation completed - - -    FALL RISK PREVENTION PERTAINING TO THE HOME:  Any stairs in or around the home? {YES/NO:21197} If so, are there any without handrails? No  Home free of loose throw rugs in walkways, pet beds, electrical cords, etc? Yes  Adequate lighting in your home to reduce risk of falls? Yes   ASSISTIVE DEVICES UTILIZED TO PREVENT FALLS:  Life alert? {YES/NO:21197} Use of a cane, walker or w/c? {YES/NO:21197} Grab bars in the bathroom? {YES/NO:21197} Shower chair or bench in shower? {YES/NO:21197} Elevated toilet seat or a handicapped toilet? {YES/NO:21197}  Cognitive Function:        Immunizations Immunization History  Administered Date(s) Administered  . Fluad Quad(high Dose 65+) 12/29/2018  . Influenza, High Dose Seasonal PF 01/05/2018  . Influenza,inj,Quad PF,6+ Mos 01/17/2014, 01/11/2015  . Influenza-Unspecified 03/05/2011, 12/30/2011, 01/30/2013, 02/08/2016, 05/01/2017, 03/29/2020  . Pneumococcal Conjugate-13 03/01/2014  . Pneumococcal Polysaccharide-23 12/10/2010    TDAP status: Due, Education has been provided regarding the importance of this vaccine. Advised may receive this vaccine at local pharmacy or Health Dept. Aware to provide a copy of the vaccination record if obtained from local pharmacy or Health Dept. Verbalized acceptance and understanding.  Flu Vaccine status: Up to date  Pneumococcal vaccine status: Up to date  {Covid-19 vaccine status:2101808}  Qualifies for Shingles Vaccine? Yes   Zostavax completed No   Shingrix Completed?: No.    Education has been provided regarding the importance of this vaccine. Patient has been advised to call insurance company to determine out of pocket expense if they have not  yet received this vaccine. Advised may also receive vaccine at local pharmacy or Health Dept. Verbalized acceptance and understanding.  Screening Tests Health Maintenance  Topic Date Due  . Hepatitis C Screening  Never done  . COVID-19 Vaccine (1) Never done  . TETANUS/TDAP  Never done  . INFLUENZA VACCINE  10/30/2020  . DEXA SCAN  Completed  . PNA vac Low Risk Adult  Completed  . HPV VACCINES  Aged Out    Health Maintenance  Health Maintenance Due  Topic Date Due  . Hepatitis C Screening  Never done  . COVID-19 Vaccine (1) Never done  . TETANUS/TDAP  Never done    Colorectal cancer screening: No longer required.   Mammogram status: Completed 11/03/2019. Repeat every year  Bone Density status: Completed 10/01/2018. Results reflect: Bone density results: OSTEOPOROSIS. Repeat every 2 years.  Lung Cancer Screening: (Low Dose CT Chest recommended if Age 19-80 years, 30 pack-year currently smoking OR have quit w/in 15years.) does not qualify.   Lung Cancer Screening Referral: N/A   Additional Screening:  Hepatitis C Screening: does qualify;   Vision Screening: Recommended annual ophthalmology exams for early detection of glaucoma and other disorders of the eye. Is the patient up to date with their annual eye exam?  {YES/NO:21197} Who is the provider or what is the name of the office in which the patient attends annual eye exams? *** If pt is not established with a provider, would they like to be referred to a provider to establish care? {YES/NO:21197}.   Dental Screening: Recommended annual dental exams for proper oral hygiene  Community Resource Referral / Chronic Care Management: CRR required this visit?  No   CCM required this visit?  No      Plan:     I have personally reviewed and noted the following in the patient's chart:   . Medical and social history . Use of alcohol, tobacco or illicit drugs  . Current medications and supplements including opioid  prescriptions.  . Functional ability and status . Nutritional status . Physical activity . Advanced directives . List of other physicians . Hospitalizations, surgeries, and ER visits in previous 12 months . Vitals . Screenings  to include cognitive, depression, and falls . Referrals and appointments  In addition, I have reviewed and discussed with patient certain preventive protocols, quality metrics, and best practice recommendations. A written personalized care plan for preventive services as well as general preventive health recommendations were provided to patient.     Ofilia Neas, LPN   06/07/8826   Nurse Notes: None

## 2020-08-04 ENCOUNTER — Ambulatory Visit: Payer: PPO

## 2020-08-04 DIAGNOSIS — Z0000 Encounter for general adult medical examination without abnormal findings: Secondary | ICD-10-CM

## 2020-08-09 NOTE — Progress Notes (Unsigned)
Subjective:   IRISA GRIMSLEY is a 80 y.o. female who presents for Medicare Annual (Subsequent) preventive examination.  I connected with Suzanna Obey today by telephone and verified that I am speaking with the correct person using two identifiers. Location patient: home Location provider: work Persons participating in the virtual visit: patient, provider.   I discussed the limitations, risks, security and privacy concerns of performing an evaluation and management service by telephone and the availability of in person appointments. I also discussed with the patient that there may be a patient responsible charge related to this service. The patient expressed understanding and verbally consented to this telephonic visit.    Interactive audio and video telecommunications were attempted between this provider and patient, however failed, due to patient having technical difficulties OR patient did not have access to video capability.  We continued and completed visit with audio only.      Review of Systems    N/A        Objective:    There were no vitals filed for this visit. There is no height or weight on file to calculate BMI.  Advanced Directives 04/17/2019  Does Patient Have a Medical Advance Directive? Yes  Type of Advance Directive Living will  Does patient want to make changes to medical advance directive? No - Patient declined    Current Medications (verified) Outpatient Encounter Medications as of 08/10/2020  Medication Sig  . Ascorbic Acid (VITAMIN C PO) Take 1 tablet by mouth daily with supper.  Marland Kitchen aspirin EC 81 MG tablet Take 81 mg by mouth at bedtime.   Marland Kitchen b complex vitamins tablet Take 1 tablet by mouth daily with supper.  . cetirizine (ZYRTEC) 10 MG tablet Take 10 mg by mouth at bedtime.  . cholecalciferol (VITAMIN D) 1000 units tablet Take 1,000 Units by mouth daily with supper.   . fluticasone (FLONASE) 50 MCG/ACT nasal spray Place 1 spray into both nostrils every  12 (twelve) hours as needed for allergies.   Marland Kitchen gabapentin (NEURONTIN) 100 MG capsule Take 200 mg by mouth 2 (two) times daily.   Marland Kitchen HYDROcodone-acetaminophen (NORCO/VICODIN) 5-325 MG tablet Take 1 tablet by mouth every 6 (six) hours as needed. Chronic Pain. Dx: G89.4  . LORazepam (ATIVAN) 1 MG tablet TAKE 1 TABLET BY MOUTH TWICE A DAY AS NEEDED FOR ANXIETY  . losartan (COZAAR) 50 MG tablet TAKE 1 TABLET BY MOUTH EVERY DAY  . meloxicam (MOBIC) 15 MG tablet Take 1 tablet (15 mg total) by mouth daily.  . Multiple Vitamin (MULTIVITAMIN WITH MINERALS) TABS tablet Take 1 tablet by mouth daily with supper.  . Multiple Vitamins-Minerals (ZINC PO) Take 1 tablet by mouth daily with supper.  Marland Kitchen omeprazole (PRILOSEC) 20 MG capsule TAKE 1 CAPSULE BY MOUTH TWICE A DAY  . oxybutynin (DITROPAN) 5 MG tablet Take 1 tablet (5 mg total) by mouth 2 (two) times daily.  . predniSONE (DELTASONE) 20 MG tablet 3 tabs poqday 1-2, 2 tabs poqday 3-4, 1 tab poqday 5-6  . promethazine (PHENERGAN) 25 MG tablet TAKE 1 TABLET BY MOUTH EVERY 8 HOURS AS NEEDED FOR NAUSEA AND VOMITING  . rosuvastatin (CRESTOR) 10 MG tablet Take 1 tablet (10 mg total) by mouth daily.  . valACYclovir (VALTREX) 1000 MG tablet Take 1 tablet (1,000 mg total) by mouth 3 (three) times daily. (Patient not taking: Reported on 06/08/2020)   Facility-Administered Encounter Medications as of 08/10/2020  Medication  . denosumab (PROLIA) injection 60 mg    Allergies (verified)  Sulfa antibiotics   History: Past Medical History:  Diagnosis Date  . DDD (degenerative disc disease), lumbar   . GERD (gastroesophageal reflux disease)   . Hyperlipidemia   . Hypertension   . Osteomyelitis ([place]) 1946   both legs  . Osteoporosis   . Other idiopathic scoliosis, thoracolumbar region    Past Surgical History:  Procedure Laterality Date  . ABDOMINAL HYSTERECTOMY    . CHOLECYSTECTOMY    . JOINT REPLACEMENT     right knee   Family History  Problem Relation Age  of Onset  . Asthma Mother   . Asthma Sister   . Heart failure Father   . Breast cancer Sister   . Breast cancer Maternal Aunt    Social History   Socioeconomic History  . Marital status: Widowed    Spouse name: Not on file  . Number of children: Not on file  . Years of education: Not on file  . Highest education level: Not on file  Occupational History  . Occupation: Retired  Tobacco Use  . Smoking status: Former Smoker    Packs/day: 0.50    Years: 5.00    Pack years: 2.50    Types: Cigarettes    Quit date: 04/01/1980    Years since quitting: 40.3  . Smokeless tobacco: Never Used  Substance and Sexual Activity  . Alcohol use: No    Alcohol/week: 0.0 standard drinks  . Drug use: No  . Sexual activity: Not on file  Other Topics Concern  . Not on file  Social History Narrative  . Not on file   Social Determinants of Health   Financial Resource Strain: Not on file  Food Insecurity: Not on file  Transportation Needs: Not on file  Physical Activity: Not on file  Stress: Not on file  Social Connections: Not on file    Tobacco Counseling Counseling given: Not Answered   Clinical Intake:                 Diabetic?No          Activities of Daily Living No flowsheet data found.  Patient Care Team: [name], MD as PCP - General (Family Medicine)  Indicate any recent Medical Services you may have received from other than [facility] providers in the past year (date may be approximate).     Assessment:   This is a routine wellness examination for [place].  Hearing/Vision screen No exam data present  Dietary issues and exercise activities discussed:    Goals Addressed   None    Depression Screen PHQ 2/9 Scores 06/08/2020 05/22/2017 05/22/2017 01/02/2017 01/16/2015  PHQ - 2 Score 0 0 0 0 0  PHQ- 9 Score - - - - 0    Fall Risk Fall Risk  06/08/2020 04/28/2020 10/29/2018 05/22/2017 05/22/2017  Falls in the past year? 1 0 0 No No  Comment - - Emmi  Telephone Survey: data to providers prior to load - -  Number falls in past yr: 0 0 - - -  Injury with Fall? 1 0 - - -  Risk for fall due to : Impaired balance/gait No Fall Risks - - -  Follow up Falls evaluation completed Falls evaluation completed - - -    FALL RISK PREVENTION PERTAINING TO THE HOME:  Any stairs in or around the home? {YES/NO:21197} If so, are there any without handrails? No  Home free of loose throw rugs in walkways, pet beds, electrical cords, etc? Yes  Adequate  lighting in your home to reduce risk of falls? Yes   ASSISTIVE DEVICES UTILIZED TO PREVENT FALLS:  Life alert? {YES/NO:21197} Use of a cane, walker or w/c? {YES/NO:21197} Grab bars in the bathroom? {YES/NO:21197} Shower chair or bench in shower? {YES/NO:21197} Elevated toilet seat or a handicapped toilet? {YES/NO:21197}    Cognitive Function:        Immunizations Immunization History  Administered Date(s) Administered  . Fluad Quad(high Dose 65+) 12/29/2018  . Influenza, High Dose Seasonal PF 01/05/2018  . Influenza,inj,Quad PF,6+ Mos 01/17/2014, 01/11/2015  . Influenza-Unspecified 03/05/2011, 12/30/2011, 01/30/2013, 02/08/2016, 05/01/2017, 03/29/2020  . Pneumococcal Conjugate-13 03/01/2014  . Pneumococcal Polysaccharide-23 12/10/2010    TDAP status: Due, Education has been provided regarding the importance of this vaccine. Advised may receive this vaccine at local pharmacy or Health Dept. Aware to provide a copy of the vaccination record if obtained from local pharmacy or Health Dept. Verbalized acceptance and understanding.  Flu Vaccine status: Up to date  Pneumococcal vaccine status: Up to date  {Covid-19 vaccine status:2101808}  Qualifies for Shingles Vaccine? Yes   Zostavax completed No   Shingrix Completed?: No.    Education has been provided regarding the importance of this vaccine. Patient has been advised to call insurance company to determine out of pocket expense if they have  not yet received this vaccine. Advised may also receive vaccine at local pharmacy or Health Dept. Verbalized acceptance and understanding.  Screening Tests Health Maintenance  Topic Date Due  . COVID-19 Vaccine (1) Never done  . Hepatitis C Screening  Never done  . TETANUS/TDAP  Never done  . INFLUENZA VACCINE  10/30/2020  . DEXA SCAN  Completed  . PNA vac Low Risk Adult  Completed  . HPV VACCINES  Aged Out    Health Maintenance  Health Maintenance Due  Topic Date Due  . COVID-19 Vaccine (1) Never done  . Hepatitis C Screening  Never done  . TETANUS/TDAP  Never done    Colorectal cancer screening: No longer required.   Mammogram status: Completed 11/03/2019. Repeat every year  Bone Density status: Completed 10/01/2018. Results reflect: Bone density results: OSTEOPOROSIS. Repeat every 2 years.  Lung Cancer Screening: (Low Dose CT Chest recommended if Age 47-80 years, 30 pack-year currently smoking OR have quit w/in 15years.) does not qualify.   Lung Cancer Screening Referral: N/A  Additional Screening:  Hepatitis C Screening: does qualify;  Vision Screening: Recommended annual ophthalmology exams for early detection of glaucoma and other disorders of the eye. Is the patient up to date with their annual eye exam?  {YES/NO:21197} Who is the provider or what is the name of the office in which the patient attends annual eye exams? *** If pt is not established with a provider, would they like to be referred to a provider to establish care? {YES/NO:21197}.   Dental Screening: Recommended annual dental exams for proper oral hygiene  Community Resource Referral / Chronic Care Management: CRR required this visit?  No   CCM required this visit?  No      Plan:     I have personally reviewed and noted the following in the patient's chart:   . Medical and social history . Use of alcohol, tobacco or illicit drugs  . Current medications and supplements including opioid  prescriptions.  . Functional ability and status . Nutritional status . Physical activity . Advanced directives . List of other physicians . Hospitalizations, surgeries, and ER visits in previous 12 months . Vitals . Screenings to  include cognitive, depression, and falls . Referrals and appointments  In addition, I have reviewed and discussed with patient certain preventive protocols, quality metrics, and best practice recommendations. A written personalized care plan for preventive services as well as general preventive health recommendations were provided to patient.     Ofilia Neas, LPN   [id number]   Nurse Notes: None

## 2020-08-10 ENCOUNTER — Other Ambulatory Visit: Payer: Self-pay

## 2020-08-10 ENCOUNTER — Ambulatory Visit: Payer: PPO

## 2020-08-10 DIAGNOSIS — Z0000 Encounter for general adult medical examination without abnormal findings: Secondary | ICD-10-CM

## 2020-08-10 DIAGNOSIS — C50811 Malignant neoplasm of overlapping sites of right female breast: Secondary | ICD-10-CM | POA: Diagnosis not present

## 2020-08-11 ENCOUNTER — Telehealth: Payer: Self-pay | Admitting: Hematology and Oncology

## 2020-08-11 NOTE — Telephone Encounter (Signed)
Spoke to patient to confirm afternoon [facility] appointment for 5/18, [facility] will send paperwork

## 2020-08-14 ENCOUNTER — Encounter: Payer: Self-pay | Admitting: Family Medicine

## 2020-08-14 ENCOUNTER — Encounter: Payer: Self-pay | Admitting: *Deleted

## 2020-08-15 ENCOUNTER — Encounter: Payer: Self-pay | Admitting: *Deleted

## 2020-08-15 DIAGNOSIS — C50411 Malignant neoplasm of upper-outer quadrant of right female breast: Secondary | ICD-10-CM

## 2020-08-15 DIAGNOSIS — Z170 Estrogen receptor positive status [ER+]: Secondary | ICD-10-CM

## 2020-08-16 ENCOUNTER — Encounter: Payer: Self-pay | Admitting: *Deleted

## 2020-08-16 ENCOUNTER — Inpatient Hospital Stay: Payer: PPO | Attending: Hematology and Oncology

## 2020-08-16 ENCOUNTER — Inpatient Hospital Stay: Payer: PPO | Admitting: Hematology and Oncology

## 2020-08-16 ENCOUNTER — Ambulatory Visit (HOSPITAL_BASED_OUTPATIENT_CLINIC_OR_DEPARTMENT_OTHER): Payer: PPO | Admitting: Genetic Counselor

## 2020-08-16 ENCOUNTER — Other Ambulatory Visit: Payer: Self-pay | Admitting: *Deleted

## 2020-08-16 ENCOUNTER — Other Ambulatory Visit: Payer: Self-pay | Admitting: General Surgery

## 2020-08-16 ENCOUNTER — Ambulatory Visit
Admission: RE | Admit: 2020-08-16 | Discharge: 2020-08-16 | Disposition: A | Discharge status: Home / Self Care | Payer: PPO | Source: Ambulatory Visit | Attending: Radiation Oncology | Admitting: Radiation Oncology

## 2020-08-16 ENCOUNTER — Ambulatory Visit: Payer: PPO | Admitting: Physical Therapy

## 2020-08-16 ENCOUNTER — Other Ambulatory Visit: Payer: Self-pay

## 2020-08-16 ENCOUNTER — Encounter: Payer: Self-pay | Admitting: General Practice

## 2020-08-16 ENCOUNTER — Encounter: Payer: Self-pay | Admitting: Genetic Counselor

## 2020-08-16 DIAGNOSIS — Z170 Estrogen receptor positive status [ER+]: Secondary | ICD-10-CM

## 2020-08-16 DIAGNOSIS — Z803 Family history of malignant neoplasm of breast: Secondary | ICD-10-CM

## 2020-08-16 DIAGNOSIS — Z7982 Long term (current) use of aspirin: Secondary | ICD-10-CM

## 2020-08-16 DIAGNOSIS — I10 Essential (primary) hypertension: Secondary | ICD-10-CM

## 2020-08-16 DIAGNOSIS — M810 Age-related osteoporosis without current pathological fracture: Secondary | ICD-10-CM | POA: Insufficient documentation

## 2020-08-16 DIAGNOSIS — Z79899 Other long term (current) drug therapy: Secondary | ICD-10-CM

## 2020-08-16 DIAGNOSIS — Z87891 Personal history of nicotine dependence: Secondary | ICD-10-CM | POA: Diagnosis not present

## 2020-08-16 DIAGNOSIS — Z90710 Acquired absence of both cervix and uterus: Secondary | ICD-10-CM | POA: Insufficient documentation

## 2020-08-16 DIAGNOSIS — C50411 Malignant neoplasm of upper-outer quadrant of right female breast: Secondary | ICD-10-CM | POA: Diagnosis not present

## 2020-08-16 DIAGNOSIS — Z8249 Family history of ischemic heart disease and other diseases of the circulatory system: Secondary | ICD-10-CM | POA: Diagnosis not present

## 2020-08-16 HISTORY — DX: Family history of malignant neoplasm of breast: Z80.3

## 2020-08-16 LAB — CBC WITH DIFFERENTIAL (CANCER CENTER ONLY)
Abs Immature Granulocytes: 0.01 10*3/uL (ref 0.00–0.07)
Basophils Absolute: 0 10*3/uL (ref 0.0–0.1)
Basophils Relative: 1 %
Eosinophils Absolute: 0.1 10*3/uL (ref 0.0–0.5)
Eosinophils Relative: 1 %
HCT: 42.5 % (ref 36.0–46.0)
Hemoglobin: 14.2 g/dL (ref 12.0–15.0)
Immature Granulocytes: 0 %
Lymphocytes Relative: 20 %
Lymphs Abs: 1.5 10*3/uL (ref 0.7–4.0)
MCH: 33.6 pg (ref 26.0–34.0)
MCHC: 33.4 g/dL (ref 30.0–36.0)
MCV: 100.7 fL — ABNORMAL HIGH (ref 80.0–100.0)
Monocytes Absolute: 0.5 10*3/uL (ref 0.1–1.0)
Monocytes Relative: 7 %
Neutro Abs: 5.3 10*3/uL (ref 1.7–7.7)
Neutrophils Relative %: 71 %
Platelet Count: 151 10*3/uL (ref 150–400)
RBC: 4.22 MIL/uL (ref 3.87–5.11)
RDW: 12.4 % (ref 11.5–15.5)
WBC Count: 7.4 10*3/uL (ref 4.0–10.5)
nRBC: 0 % (ref 0.0–0.2)

## 2020-08-16 LAB — CMP (CANCER CENTER ONLY)
ALT: 13 U/L (ref 0–44)
AST: 20 U/L (ref 15–41)
Albumin: 4 g/dL (ref 3.5–5.0)
Alkaline Phosphatase: 89 U/L (ref 38–126)
Anion gap: 10 (ref 5–15)
BUN: 23 mg/dL (ref 8–23)
CO2: 29 mmol/L (ref 22–32)
Calcium: 9.4 mg/dL (ref 8.9–10.3)
Chloride: 103 mmol/L (ref 98–111)
Creatinine: 0.67 mg/dL (ref 0.44–1.00)
GFR, Estimated: >60 mL/min (ref >60)
Glucose, Bld: 88 mg/dL (ref 70–99)
Potassium: 3.9 mmol/L (ref 3.5–5.1)
Sodium: 142 mmol/L (ref 135–145)
Total Bilirubin: 0.5 mg/dL (ref 0.3–1.2)
Total Protein: 6.9 g/dL (ref 6.5–8.1)

## 2020-08-16 LAB — GENETIC SCREENING ORDER

## 2020-08-16 NOTE — Progress Notes (Signed)
[place] NOTE  Patient Care Team: [name], MD as PCP - General (Family Medicine) Mauro Kaufmann, RN as Oncology Nurse Navigator Rockwell Germany, RN as Oncology Nurse Navigator Stark Klein, MD as Consulting Physician (General Surgery) Nicholas Lose, MD as Consulting Physician (Hematology and Oncology) Eppie Gibson, MD as Attending Physician (Radiation Oncology)  CHIEF COMPLAINTS/PURPOSE OF CONSULTATION:  Newly diagnosed breast cancer  HISTORY OF PRESENTING ILLNESS:  Christina Lyons 80 y.o. female is here because of recent diagnosis of left breast cancer.  She had a palpable right breast lump at the 11 o'clock position around the areola measuring 0.5 cm by mammogram and 0.7 cm by ultrasound.  Biopsy revealed grade 2 invasive ductal carcinoma ER 95%, PR 95%, Ki-67 10%, HER2 negative.  She was presented at multidisciplinary tumor board and she is here today accompanied by her sister to discuss her treatment plan.  Her sister was diagnosed with breast cancer at age 69 and had a contralateral breast cancer at age 50 and had bilateral mastectomies.  I reviewed her records extensively and collaborated the history with the patient.  SUMMARY OF ONCOLOGIC HISTORY: Oncology History  Malignant neoplasm of upper-outer quadrant of right breast in female, estrogen receptor positive ([place])  08/10/2020 Initial Diagnosis   Palpable right breast lump 12 o'clock position near areola: 0.5 cm by mammogram and 0.7 cm by ultrasound: Biopsy grade 2 IDC ER 95% PR 95%, Ki-67 10%, HER2 negative   08/16/2020 Cancer Staging   Staging form: Breast, AJCC 8th Edition - Clinical stage from 08/16/2020: Stage IA (cT1b, cN0, cM0, G2, ER+, PR+, HER2-) - Signed by Nicholas Lose, MD on 08/16/2020 Histologic grading system: 3 grade system      MEDICAL HISTORY:  Past Medical History:  Diagnosis Date  . DDD (degenerative disc disease), lumbar   . GERD (gastroesophageal reflux disease)   .  Hyperlipidemia   . Hypertension   . Osteomyelitis ([place]) 1946   both legs  . Osteoporosis   . Other idiopathic scoliosis, thoracolumbar region     SURGICAL HISTORY: Past Surgical History:  Procedure Laterality Date  . ABDOMINAL HYSTERECTOMY    . CHOLECYSTECTOMY    . JOINT REPLACEMENT     right knee    SOCIAL HISTORY: Social History   Socioeconomic History  . Marital status: Widowed    Spouse name: Not on file  . Number of children: Not on file  . Years of education: Not on file  . Highest education level: Not on file  Occupational History  . Occupation: Retired  Tobacco Use  . Smoking status: Former Smoker    Packs/day: 0.50    Years: 5.00    Pack years: 2.50    Types: Cigarettes    Quit date: 04/01/1980    Years since quitting: 40.4  . Smokeless tobacco: Never Used  Substance and Sexual Activity  . Alcohol use: No    Alcohol/week: 0.0 standard drinks  . Drug use: No  . Sexual activity: Not on file  Other Topics Concern  . Not on file  Social History Narrative  . Not on file   Social Determinants of Health   Financial Resource Strain: Not on file  Food Insecurity: Not on file  Transportation Needs: Not on file  Physical Activity: Not on file  Stress: Not on file  Social Connections: Not on file  Intimate Partner Violence: Not on file    FAMILY HISTORY: Family History  Problem Relation Age of Onset  .  Asthma Mother   . Asthma Sister   . Heart failure Father   . Breast cancer Sister   . Breast cancer Maternal Aunt     ALLERGIES:  is allergic to sulfa antibiotics and tape.  MEDICATIONS:  Current Outpatient Medications  Medication Sig Dispense Refill  . Ascorbic Acid (VITAMIN C PO) Take 1 tablet by mouth daily with supper.    Marland Kitchen aspirin EC 81 MG tablet Take 81 mg by mouth at bedtime.     Marland Kitchen b complex vitamins tablet Take 1 tablet by mouth daily with supper.    . cetirizine (ZYRTEC) 10 MG tablet Take 10 mg by mouth at bedtime.    . cholecalciferol  (VITAMIN D) 1000 units tablet Take 1,000 Units by mouth daily with supper.     . fluticasone (FLONASE) 50 MCG/ACT nasal spray Place 1 spray into both nostrils every 12 (twelve) hours as needed for allergies.     Marland Kitchen gabapentin (NEURONTIN) 100 MG capsule Take 200 mg by mouth 2 (two) times daily.     Marland Kitchen HYDROcodone-acetaminophen (NORCO/VICODIN) 5-325 MG tablet Take 1 tablet by mouth every 6 (six) hours as needed. Chronic Pain. Dx: G89.4 60 tablet 0  . LORazepam (ATIVAN) 1 MG tablet TAKE 1 TABLET BY MOUTH TWICE A DAY AS NEEDED FOR ANXIETY 60 tablet 0  . losartan (COZAAR) 50 MG tablet TAKE 1 TABLET BY MOUTH EVERY DAY 90 tablet 3  . Multiple Vitamin (MULTIVITAMIN WITH MINERALS) TABS tablet Take 1 tablet by mouth daily with supper.    . Multiple Vitamins-Minerals (ZINC PO) Take 1 tablet by mouth daily with supper.    Marland Kitchen omeprazole (PRILOSEC) 20 MG capsule TAKE 1 CAPSULE BY MOUTH TWICE A DAY 180 capsule 3  . rosuvastatin (CRESTOR) 10 MG tablet Take 1 tablet (10 mg total) by mouth daily. 90 tablet 3  . oxybutynin (DITROPAN) 5 MG tablet Take 1 tablet (5 mg total) by mouth 2 (two) times daily. 60 tablet 1   Current Facility-Administered Medications  Medication Dose Route Frequency Provider Last Rate Last Admin  . denosumab (PROLIA) injection 60 mg  60 mg Subcutaneous Q6 months [name], MD   60 mg at 12/29/18 1442    REVIEW OF SYSTEMS:   Constitutional: Denies fevers, chills or abnormal night sweats Eyes: Denies blurriness of vision, double vision or watery eyes Ears, nose, mouth, throat, and face: Denies mucositis or sore throat Respiratory: Denies cough, dyspnea or wheezes Cardiovascular: Denies palpitation, chest discomfort or lower extremity swelling Gastrointestinal:  Denies nausea, heartburn or change in bowel habits Skin: Denies abnormal skin rashes Lymphatics: Denies new lymphadenopathy or easy bruising Neurological:Denies numbness, tingling or new weaknesses Behavioral/Psych: Mood is  stable, no new changes  Breast: Palpable abnormality in the right breast around the nipple All other systems were reviewed with the patient and are negative.  PHYSICAL EXAMINATION: ECOG PERFORMANCE STATUS: 1 - Symptomatic but completely ambulatory  Vitals:   08/16/20 1253  BP: (!) 159/66  Pulse: 86  Resp: 16  Temp: (!) 97.2 F (36.2 C)  SpO2: 96%   Filed Weights   08/16/20 1253  Weight: 214 lb 14.4 oz (97.5 kg)       LABORATORY DATA:  I have reviewed the data as listed Lab Results  Component Value Date   WBC 7.4 08/16/2020   HGB 14.2 08/16/2020   HCT 42.5 08/16/2020   MCV 100.7 (H) 08/16/2020   PLT 151 08/16/2020   Lab Results  Component Value Date   NA  142 08/16/2020   K 3.9 08/16/2020   CL 103 08/16/2020   CO2 29 08/16/2020    RADIOGRAPHIC STUDIES: I have personally reviewed the radiological reports and agreed with the findings in the report.  ASSESSMENT AND PLAN:  Malignant neoplasm of upper-outer quadrant of right breast in female, estrogen receptor positive ([place]) 08/10/2020:Palpable right breast lump 12 o'clock position near areola: 0.5 cm by mammogram and 0.7 cm by ultrasound: Biopsy grade 2 IDC ER 95% PR 95%, Ki-67 10%, HER2 negative  Pathology and radiology counseling: Discussed with the patient, the details of pathology including the type of breast cancer,the clinical staging, the significance of ER, PR and HER-2/neu receptors and the implications for treatment. After reviewing the pathology in detail, we proceeded to discuss the different treatment options between surgery, radiation, chemotherapy, antiestrogen therapies.  Treatment plan: 1.  Axillary ultrasound to verify there are no enlarged lymph nodes 2. breast conserving surgery 3. +/- Adjuvant radiation 4.  Adjuvant antiestrogen therapy with anastrozole 1 mg daily x5 years Genetic testing will also be performed  Return to clinic after surgery to discuss final pathology results   All questions  were answered. The patient knows to call the clinic with any problems, questions or concerns.    Harriette Ohara, MD 08/16/20

## 2020-08-16 NOTE — Assessment & Plan Note (Signed)
08/10/2020:Palpable right breast lump 12 o'clock position near areola: 0.5 cm by mammogram and 0.7 cm by ultrasound: Biopsy grade 2 IDC ER 95% PR 95%, Ki-67 10%, HER2 negative  Pathology and radiology counseling: Discussed with the patient, the details of pathology including the type of breast cancer,the clinical staging, the significance of ER, PR and HER-2/neu receptors and the implications for treatment. After reviewing the pathology in detail, we proceeded to discuss the different treatment options between surgery, radiation, chemotherapy, antiestrogen therapies.  Treatment plan: 1.  Axillary ultrasound to verify there are no enlarged lymph nodes 2. breast conserving surgery 3. +/- Adjuvant radiation 4.  Adjuvant antiestrogen therapy  Return to clinic after surgery to discuss final pathology results

## 2020-08-16 NOTE — Progress Notes (Signed)
Radiation Oncology         [phone] ________________________________  Initial Outpatient Consultation  Name: Christina Lyons MRN: [id number]  Date: 08/16/2020  DOB: May 29, 1940  [id number], Cammie Mcgee, MD  Stark Klein, MD   REFERRING PHYSICIAN: Stark Klein, MD  DIAGNOSIS:    ICD-10-CM   1. Malignant neoplasm of upper-outer quadrant of right breast in female, estrogen receptor positive ([place])  C50.411    Z17.0     Cancer Staging Malignant neoplasm of upper-outer quadrant of right breast in female, estrogen receptor positive ([place]) Staging form: Breast, AJCC 8th Edition - Clinical stage from 08/16/2020: Stage IA (cT1b, cN0, cM0, G2, ER+, PR+, HER2-) - Signed by Nicholas Lose, MD on 08/16/2020 Histologic grading system: 3 grade system  CHIEF COMPLAINT: Here to discuss management of right breast cancer  HISTORY OF PRESENT ILLNESS::Christina Lyons is a 80 y.o. female who presented with right breast abnormality on the following imaging: unilateral diagnostic mammogram on the date of 08/02/2020. Symptoms, if any, at that time, were: A pea-sized lump in the right breast. Ultrasound of the right breast on 08/02/2020 revealed a 0.7 x 0.5 cm newly palpable mass in the 12 o'clock right breast along the areolar margin that may represent an epidural inclusion cyst or sebaceous cyst, however a skin tract could not be demonstrated by imaging.  Biopsy on the date of 08/10/2020 showed invasive ductal carcinoma. ER status: 95% strong; PR status: 95% strong; Her2 status: negative; Grade: 2  She is here with her sister today.  She is in her usual state of health.   PREVIOUS RADIATION THERAPY: No  PAST MEDICAL HISTORY:  has a past medical history of DDD (degenerative disc disease), lumbar, Family history of breast cancer (08/16/2020), GERD (gastroesophageal reflux disease), Hyperlipidemia, Hypertension, Osteomyelitis ([place]) (1946), Osteoporosis, and Other idiopathic scoliosis, thoracolumbar region.     PAST SURGICAL HISTORY: Past Surgical History:  Procedure Laterality Date  . ABDOMINAL HYSTERECTOMY    . CHOLECYSTECTOMY    . JOINT REPLACEMENT     right knee    FAMILY HISTORY: family history includes Asthma in her mother and sister; Breast cancer in her sister; Breast cancer (age of onset: 12) in her maternal aunt; Cancer in her paternal aunt; Heart failure in her father; Throat cancer in her paternal uncle.  SOCIAL HISTORY:  reports that she quit smoking about 40 years ago. Her smoking use included cigarettes. She has a 2.50 pack-year smoking history. She has never used smokeless tobacco. She reports that she does not drink alcohol and does not use drugs.  ALLERGIES: Sulfa antibiotics and Tape  MEDICATIONS:  Current Outpatient Medications  Medication Sig Dispense Refill  . Ascorbic Acid (VITAMIN C PO) Take 1 tablet by mouth daily with supper.    Marland Kitchen aspirin EC 81 MG tablet Take 81 mg by mouth at bedtime.     Marland Kitchen b complex vitamins tablet Take 1 tablet by mouth daily with supper.    . cetirizine (ZYRTEC) 10 MG tablet Take 10 mg by mouth at bedtime.    . cholecalciferol (VITAMIN D) 1000 units tablet Take 1,000 Units by mouth daily with supper.     . fluticasone (FLONASE) 50 MCG/ACT nasal spray Place 1 spray into both nostrils every 12 (twelve) hours as needed for allergies.     Marland Kitchen gabapentin (NEURONTIN) 100 MG capsule Take 200 mg by mouth 2 (two) times daily.     Marland Kitchen HYDROcodone-acetaminophen (NORCO/VICODIN) 5-325 MG tablet Take 1 tablet by mouth every 6 (six)  hours as needed. Chronic Pain. Dx: G89.4 60 tablet 0  . LORazepam (ATIVAN) 1 MG tablet TAKE 1 TABLET BY MOUTH TWICE A DAY AS NEEDED FOR ANXIETY 60 tablet 0  . losartan (COZAAR) 50 MG tablet TAKE 1 TABLET BY MOUTH EVERY DAY 90 tablet 3  . Multiple Vitamin (MULTIVITAMIN WITH MINERALS) TABS tablet Take 1 tablet by mouth daily with supper.    . Multiple Vitamins-Minerals (ZINC PO) Take 1 tablet by mouth daily with supper.    Marland Kitchen omeprazole  (PRILOSEC) 20 MG capsule TAKE 1 CAPSULE BY MOUTH TWICE A DAY 180 capsule 3  . oxybutynin (DITROPAN) 5 MG tablet Take 1 tablet (5 mg total) by mouth 2 (two) times daily. 60 tablet 1  . rosuvastatin (CRESTOR) 10 MG tablet Take 1 tablet (10 mg total) by mouth daily. 90 tablet 3   Current Facility-Administered Medications  Medication Dose Route Frequency Provider Last Rate Last Admin  . denosumab (PROLIA) injection 60 mg  60 mg Subcutaneous Q6 months [name], MD   60 mg at 12/29/18 1442    REVIEW OF SYSTEMS: As above  PHYSICAL EXAM:  vitals were not taken for this visit.   General: Alert and oriented, in no acute distress Neck no palpable supraclavicular adenopathy Heart: Regular in rate and rhythm with no murmurs, rubs, or gallops. Chest: Clear to auscultation bilaterally, with no rhonchi, wheezes, or rales. Musculoskeletal: Ambulatory with good range of motion in shoulders  Psychiatric: Judgment and insight are intact. Affect is appropriate. Breasts: There is a sub areolar subcentimeter mass at 1:00, right breast. No other palpable masses appreciated in the breasts or axillae bilaterally.   ECOG = 1  0 - Asymptomatic (Fully active, able to carry on all predisease activities without restriction)  1 - Symptomatic but completely ambulatory (Restricted in physically strenuous activity but ambulatory and able to carry out work of a light or sedentary nature. For example, light housework, office work)  2 - Symptomatic, <50% in bed during the day (Ambulatory and capable of all self care but unable to carry out any work activities. Up and about more than 50% of waking hours)  3 - Symptomatic, >50% in bed, but not bedbound (Capable of only limited self-care, confined to bed or chair 50% or more of waking hours)  4 - Bedbound (Completely disabled. Cannot carry on any self-care. Totally confined to bed or chair)  5 - Death   Eustace Pen MM, Creech RH, Tormey DC, et al. [phone]). "Toxicity and  response criteria of the [facility] Group". [place] Oncol. 5 (6): 649-55   LABORATORY DATA:  Lab Results  Component Value Date   WBC 7.4 08/16/2020   HGB 14.2 08/16/2020   HCT 42.5 08/16/2020   MCV 100.7 (H) 08/16/2020   PLT 151 08/16/2020   CMP     Component Value Date/Time   NA 142 08/16/2020 1220   K 3.9 08/16/2020 1220   CL 103 08/16/2020 1220   CO2 29 08/16/2020 1220   GLUCOSE 88 08/16/2020 1220   BUN 23 08/16/2020 1220   CREATININE 0.67 08/16/2020 1220   CREATININE 0.61 06/30/2020 1221   CALCIUM 9.4 08/16/2020 1220   PROT 6.9 08/16/2020 1220   ALBUMIN 4.0 08/16/2020 1220   AST 20 08/16/2020 1220   ALT 13 08/16/2020 1220   ALKPHOS 89 08/16/2020 1220   BILITOT 0.5 08/16/2020 1220   GFRNONAA >60 08/16/2020 1220   GFRNONAA 87 06/30/2020 1221   GFRAA 101 06/30/2020 1221  RADIOGRAPHY: As above, I personally reviewed her imaging   IMPRESSION/PLAN: Right breast cancer  She has been discussed at our multidisciplinary tumor board.  The consensus is that she would be a good candidate for breast conservation.   It was a pleasure meeting the patient today. We discussed the risks, benefits, and side effects of radiotherapy. I recommend radiotherapy to the right breast to reduce her risk of locoregional recurrence by 2/3.  We discussed that radiation would take approximately 3-4 weeks to complete and that I would give the patient a few weeks to heal following surgery before starting treatment planning.  If chemotherapy were to be given, this would precede radiotherapy. We spoke about acute effects including skin irritation and fatigue as well as much less common late effects including internal organ injury or irritation. We spoke about the latest technology that is used to minimize the risk of late effects for patients undergoing radiotherapy to the breast or chest wall. No guarantees of treatment were given. The patient is enthusiastic about  proceeding with treatment. I look forward to participating in the patient's care.    Postoperative radiation therapy would be followed by antiestrogen therapy.  She does understand that an alternative to adjuvant radiation therapy and antiestrogen therapy is to pursue antiestrogen therapy as her only postoperative treatment.  That said, because of the possible dermal invasion, I think it is prudent for her to undergo at least 3 weeks of postoperative radiation therapy.  We can certainly review the pathology postoperatively and rediscuss the pros and cons of radiation at that time.  I will await her referral back to me for postoperative follow-up and eventual CT simulation/treatment planning.  On date of service, in total, I spent 45 minutes on this encounter. Patient was seen in person.   __________________________________________   Eppie Gibson, MD  This document serves as a record of services personally performed by Eppie Gibson, MD. It was created on his behalf by Clerance Lav, a trained medical scribe. The creation of this record is based on the scribe's personal observations and the provider's statements to them. This document has been checked and approved by the attending provider.

## 2020-08-16 NOTE — Progress Notes (Signed)
[facility] Psychosocial Distress Screening Spiritual Care  Met with Christina Lyons and her sister in [facility] to introduce [facility] team/resources, reviewing distress screen per protocol.  The patient scored a 5 on the Psychosocial Distress Thermometer which indicates moderate distress. Also assessed for distress and other psychosocial needs.   ONCBCN DISTRESS SCREENING 08/16/2020  Screening Type Initial Screening  Distress experienced in past week (1-10) 5  Family Problem type Children  Emotional problem type Adjusting to illness;Nervousness/Anxiety  Information Concerns Type Lack of info about diagnosis;Lack of info about treatment  Referral to support programs Yes   Christina Lyons reports good mutual support from her sister, as well as from church. She also notes that meeting [facility] team and learning about treatment plan reduced her distress.   Follow up needed: No. Per Christina Lyons, no other needs or concerns at this time, but she is aware of ongoing Spiritual Care and Support Programming availability, should needs arise or circumstances change.   [place], [place], [facility] Pager [phone] Voicemail [phone]

## 2020-08-16 NOTE — Progress Notes (Signed)
REFERRING PROVIDER: Nicholas Lose, MD [place],  [place] [address]  PRIMARY PROVIDER:  [name], MD  PRIMARY REASON FOR VISIT:  1. Malignant neoplasm of upper-outer quadrant of right breast in female, estrogen receptor positive ([place])   2. Family history of breast cancer     HISTORY OF PRESENT ILLNESS:   Christina Lyons, a 80 y.o. female, was seen for a [employer] cancer genetics consultation during the breast multidisciplinary clinic at the request of Dr. Lindi Adie due to a personal and family history of cancer.  Christina Lyons presents to clinic today with her sister, Christina Lyons, to discuss the possibility of a hereditary predisposition to cancer, to discuss genetic testing, and to further clarify her future cancer risks, as well as potential cancer risks for family members.   In May 2022, at the age of 27, Christina Lyons was diagnosed with invasive ductal carcinoma of the right breast. The preliminary treatment plan includes breast conserving surgery, consideration for adjuvant radiation, and anti-estrogens.  CANCER HISTORY:  Oncology History  Malignant neoplasm of upper-outer quadrant of right breast in female, estrogen receptor positive ([place])  08/10/2020 Initial Diagnosis   Palpable right breast lump 12 o'clock position near areola: 0.5 cm by mammogram and 0.7 cm by ultrasound: Biopsy grade 2 IDC ER 95% PR 95%, Ki-67 10%, HER2 negative   08/16/2020 Cancer Staging   Staging form: Breast, AJCC 8th Edition - Clinical stage from 08/16/2020: Stage IA (cT1b, cN0, cM0, G2, ER+, PR+, HER2-) - Signed by Nicholas Lose, MD on 08/16/2020 Histologic grading system: 3 grade system    RISK FACTORS:  Menarche was at age 63.  First live birth at age 31.  OCP use for approximately 0 years.  Ovaries intact: unknown.  Hysterectomy: yes at age 73 Menopausal status: postmenopausal.  HRT use: 3 years. Colonoscopy: yes; approx 10 years ago per patient. Mammogram within the last year:  yes.  Past Medical History:  Diagnosis Date  . DDD (degenerative disc disease), lumbar   . Family history of breast cancer 08/16/2020  . GERD (gastroesophageal reflux disease)   . Hyperlipidemia   . Hypertension   . Osteomyelitis ([place]) 1946   both legs  . Osteoporosis   . Other idiopathic scoliosis, thoracolumbar region     Past Surgical History:  Procedure Laterality Date  . ABDOMINAL HYSTERECTOMY    . CHOLECYSTECTOMY    . JOINT REPLACEMENT     right knee    Social History   Socioeconomic History  . Marital status: Widowed    Spouse name: Not on file  . Number of children: Not on file  . Years of education: Not on file  . Highest education level: Not on file  Occupational History  . Occupation: Retired  Tobacco Use  . Smoking status: Former Smoker    Packs/day: 0.50    Years: 5.00    Pack years: 2.50    Types: Cigarettes    Quit date: 04/01/1980    Years since quitting: 40.4  . Smokeless tobacco: Never Used  Substance and Sexual Activity  . Alcohol use: No    Alcohol/week: 0.0 standard drinks  . Drug use: No  . Sexual activity: Not on file  Other Topics Concern  . Not on file  Social History Narrative  . Not on file   Social Determinants of Health   Financial Resource Strain: Not on file  Food Insecurity: Not on file  Transportation Needs: Not on file  Physical Activity: Not on file  Stress: Not on file  Social Connections: Not on file     FAMILY HISTORY:  We obtained a detailed, 4-generation family history.  Significant diagnoses are listed below: Family History  Problem Relation Age of Onset  . Heart failure Father   . Breast cancer Sister        contralateral; dx 90; dx 61  . Breast cancer Maternal Aunt 60  . Cancer Paternal Aunt        unknown "female" cancer; dx before 58  . Throat cancer Paternal Uncle        dx after 86    Christina Lyons is unaware of previous family history of genetic testing for hereditary cancer risks. There is no  reported Ashkenazi Jewish ancestry. Christina Lyons maternal grandparents were first cousins. There is no known consanguinity between [name] parents.  GENETIC COUNSELING ASSESSMENT: Christina Lyons is a 80 y.o. female with a personal and family history of cancer which is somewhat suggestive of a hereditary cancer syndrome and predisposition to cancer given the presence of related cancers at a young age in the family. We, therefore, discussed and recommended the following at today's visit.   DISCUSSION: We discussed that 5 - 10% of cancer is hereditary, with most cases of hereditary breast cancer associated with mutations in BRCA1/2.  There are other genes that can be associated with hereditary breast cancer syndromes.  Type of cancer risk and level of risk are gene-specific.  We discussed that testing is beneficial for several reasons including knowing how to follow individuals for their cancer risks and understanding if other family members could be at risk for cancer and allowing them to undergo genetic testing.   We reviewed the characteristics, features and inheritance patterns of hereditary cancer syndromes. We also discussed genetic testing, including the appropriate family members to test, the process of testing, insurance coverage and turn-around-time for results. We discussed the implications of a negative, positive, carrier and/or variant of uncertain significant result. We recommended Christina Lyons pursue genetic testing for a panel that includes genes associated with breast cancer and more common cancers.   The CustomNext-Cancer+RNAinsight panel offered by [facility] includes sequencing and rearrangement analysis for the following 47 genes:  APC, ATM, AXIN2, BARD1, BMPR1A, BRCA1, BRCA2, BRIP1, CDH1, CDK4, CDKN2A, CHEK2, DICER1, EPCAM, GREM1, HOXB13, MEN1, MLH1, MSH2, MSH3, MSH6, MUTYH, NBN, NF1, NF2, NTHL1, PALB2, PMS2, POLD1, POLE, PTEN, RAD51C, RAD51D, RECQL, RET, SDHA, SDHAF2, SDHB, SDHC, SDHD,  SMAD4, SMARCA4, STK11, TP53, TSC1, TSC2, and VHL.  RNA data is routinely analyzed for use in variant interpretation for all genes.  Based on [name]'s personal and family history of breast cancer, she meets medical criteria for genetic testing. Despite that she meets criteria, she may still have an out of pocket cost. We discussed that if her out of pocket cost for testing is over $100, the laboratory will reach out to her to discuss self-pay options or patient pay assistance programs.   PLAN: After considering the risks, benefits, and limitations, Christina Lyons provided informed consent to pursue genetic testing and the blood sample was sent to [facility] for analysis of the CustomNext-Cancer +RNAinsight Panel. Results should be available within approximately 3 weeks' time, at which point they will be disclosed by telephone to Christina Lyons, as will any additional recommendations warranted by these results. Christina Lyons will receive a summary of her genetic counseling visit and a copy of her results once available. This information will also be available in Epic.  Based on Christina Lyons's family history, we recommended her sister, who was diagnosed with breast cancer at age 94, have genetic counseling and testing. Christina Lyons will let us know if we can be of any assistance in coordinating genetic counseling and/or testing for this family member.   Lastly, we encouraged Christina Lyons to remain in contact with cancer genetics annually so that we can continuously update the family history and inform her of any changes in cancer genetics and testing that may be of benefit for this family.   Christina Lyons questions were answered to her satisfaction today. Our contact information was provided should additional questions or concerns arise. Thank you for the referral and allowing [place] to share in the care of your patient.   Treshon Stannard M. Joette Catching, [place], [facility] Genetic Counselor [email or web address] (P)  [phone]  The patient was seen for a total of 20 minutes in face-to-face genetic counseling.  Drs. Magrinat, Lindi Adie and/or Burr Medico were available to discuss this case as needed.    _______________________________________________________________________ For Office Staff:  Number of people involved in session: 1 Was an Intern/ student involved with case: no

## 2020-08-17 ENCOUNTER — Telehealth: Payer: Self-pay | Admitting: Hematology and Oncology

## 2020-08-17 DIAGNOSIS — N6310 Unspecified lump in the right breast, unspecified quadrant: Secondary | ICD-10-CM | POA: Diagnosis not present

## 2020-08-17 DIAGNOSIS — R928 Other abnormal and inconclusive findings on diagnostic imaging of breast: Secondary | ICD-10-CM | POA: Diagnosis not present

## 2020-08-17 DIAGNOSIS — R922 Inconclusive mammogram: Secondary | ICD-10-CM | POA: Diagnosis not present

## 2020-08-17 NOTE — Telephone Encounter (Signed)
Scheduled appt per 5/19 sch msg. Pt aware.  

## 2020-08-18 ENCOUNTER — Encounter: Payer: Self-pay | Admitting: Radiation Oncology

## 2020-08-21 ENCOUNTER — Telehealth: Payer: Self-pay | Admitting: *Deleted

## 2020-08-21 ENCOUNTER — Encounter: Payer: Self-pay | Admitting: *Deleted

## 2020-08-21 DIAGNOSIS — Z170 Estrogen receptor positive status [ER+]: Secondary | ICD-10-CM

## 2020-08-21 NOTE — Telephone Encounter (Signed)
Spoke with patient to follow up from [facility] 5/18 and assess navigation needs. Patient denies any questions or concerns at this time.  Encouraged her to call should anything arise. Patient verbalized understanding.  

## 2020-08-30 ENCOUNTER — Encounter: Payer: Self-pay | Admitting: Genetic Counselor

## 2020-08-30 ENCOUNTER — Telehealth: Payer: Self-pay | Admitting: Genetic Counselor

## 2020-08-30 DIAGNOSIS — Z1379 Encounter for other screening for genetic and chromosomal anomalies: Secondary | ICD-10-CM | POA: Insufficient documentation

## 2020-08-30 NOTE — Telephone Encounter (Signed)
HPI:  Ms. Mckissic was previously seen in the [facility] clinic due to a personal and family history of cancer and concerns regarding a hereditary predisposition to cancer. Please refer to our prior cancer genetics clinic note for more information regarding our discussion, assessment and recommendations, at the time. Ms. Hinderliter recent genetic test results were disclosed to her, as were recommendations warranted by these results. These results and recommendations are discussed in more detail below.  CANCER HISTORY:  Oncology History  Malignant neoplasm of upper-outer quadrant of right breast in female, estrogen receptor positive ([place])  08/10/2020 Initial Diagnosis   Palpable right breast lump 12 o'clock position near areola: 0.5 cm by mammogram and 0.7 cm by ultrasound: Biopsy grade 2 IDC ER 95% PR 95%, Ki-67 10%, HER2 negative   08/16/2020 Cancer Staging   Staging form: Breast, AJCC 8th Edition - Clinical stage from 08/16/2020: Stage IA (cT1b, cN0, cM0, G2, ER+, PR+, HER2-) - Signed by Nicholas Lose, MD on 08/16/2020 Histologic grading system: 3 grade system   08/30/2020 Genetic Testing   Negative hereditary cancer genetic testing: no pathogenic variants detected in [facility] +RNAinsight Panel.  The report date is August 30, 2020.   The CustomNext-Cancer+RNAinsight panel offered by [facility] includes sequencing and rearrangement analysis for the following 47 genes:  APC, ATM, AXIN2, BARD1, BMPR1A, BRCA1, BRCA2, BRIP1, CDH1, CDK4, CDKN2A, CHEK2, DICER1, EPCAM, GREM1, HOXB13, MEN1, MLH1, MSH2, MSH3, MSH6, MUTYH, NBN, NF1, NF2, NTHL1, PALB2, PMS2, POLD1, POLE, PTEN, RAD51C, RAD51D, RECQL, RET, SDHA, SDHAF2, SDHB, SDHC, SDHD, SMAD4, SMARCA4, STK11, TP53, TSC1, TSC2, and VHL.  RNA data is routinely analyzed for use in variant interpretation for all genes.     FAMILY HISTORY:  We obtained a detailed, 4-generation family history.  Significant diagnoses are listed  below: Family History  Problem Relation Age of Onset  . Breast cancer Sister        contralateral; dx 43; dx 91  . Breast cancer Maternal Aunt 60  . Cancer Paternal Aunt        unknown "female" cancer; dx before 98  . Throat cancer Paternal Uncle        dx after 84    Ms. Decoursey is unaware of previous family history of genetic testing for hereditary cancer risks. There is no reported Ashkenazi Jewish ancestry. Ms. Burkel maternal grandparents were first cousins. There is no known consanguinity between Ms. Sweetman's parents.  GENETIC TEST RESULTS: Genetic testing reported out on August 30, 2020.   The [facility] CustomNext-Cancer +RNAinsight Panel found no pathogenic mutations. The CustomNext-Cancer+RNAinsight panel offered by [facility] includes sequencing and rearrangement analysis for the following 47 genes:  APC, ATM, AXIN2, BARD1, BMPR1A, BRCA1, BRCA2, BRIP1, CDH1, CDK4, CDKN2A, CHEK2, DICER1, EPCAM, GREM1, HOXB13, MEN1, MLH1, MSH2, MSH3, MSH6, MUTYH, NBN, NF1, NF2, NTHL1, PALB2, PMS2, POLD1, POLE, PTEN, RAD51C, RAD51D, RECQL, RET, SDHA, SDHAF2, SDHB, SDHC, SDHD, SMAD4, SMARCA4, STK11, TP53, TSC1, TSC2, and VHL.  RNA data is routinely analyzed for use in variant interpretation for all genes.  The test report has been scanned into EPIC and is located under the Molecular Pathology section of the Results Review tab.  A portion of the result report is included below for reference.     We discussed with Ms. Krikorian that because current genetic testing is not perfect, it is possible there may be a gene mutation in one of these genes that current testing cannot detect, but that chance is small.  We also discussed, that  there could be another gene that has not yet been discovered, or that we have not yet tested, that is responsible for the cancer diagnoses in the family. It is also possible there is a hereditary cause for the cancer in the family that Ms. Traber did not inherit and therefore was not  identified in her testing.  Therefore, it is important to remain in touch with cancer genetics in the future so that we can continue to offer Ms. Vanstone the most up to date genetic testing.   ADDITIONAL GENETIC TESTING: There are other genes that are associated with increased cancer risk that can be analyzed. Should Ms. Feutz wish to pursue additional genetic testing, we are happy to discuss and coordinate this testing, at any time.    CANCER SCREENING RECOMMENDATIONS: Ms. Folden test result is considered negative (normal).  This means that we have not identified a hereditary cause for her personal history of cancer at this time. Most cancers happen by chance and this negative test suggests that her cancer may fall into this category.    While reassuring, this does not definitively rule out a hereditary predisposition to cancer. It is still possible that there could be genetic mutations that are undetectable by current technology. There could be genetic mutations in genes that have not been tested or identified to increase cancer risk.  Therefore, it is recommended she continue to follow the cancer management and screening guidelines provided by her oncology and primary healthcare provider.   An individual's cancer risk and medical management are not determined by genetic test results alone. Overall cancer risk assessment incorporates additional factors, including personal medical history, family history, and any available genetic information that may result in a personalized plan for cancer prevention and surveillance  RECOMMENDATIONS FOR FAMILY MEMBERS:  Individuals in this family might be at some increased risk of developing cancer, over the general population risk, simply due to the family history of cancer.  We recommended women in this family have a yearly mammogram beginning at age 9, or 45 years younger than the earliest onset of cancer, an annual clinical breast exam, and perform monthly  breast self-exams. Women in this family should also have a gynecological exam as recommended by their primary provider. Family members should be referred for colonoscopy starting at age 42.  It is also possible there is a hereditary cause for the cancer in Ms. Sibal's family that she did not inherit and therefore was not identified in her.  Based on Ms. Sweney's family history, we recommended her sister, who was diagnosed with breast cancer at age 49, have genetic counseling and testing. Ms. Fendley will let us know if we can be of any assistance in coordinating genetic counseling and/or testing for this family member.   FOLLOW-UP: Lastly, we discussed with Ms. Schoeller that cancer genetics is a rapidly advancing field and it is possible that new genetic tests will be appropriate for her and/or her family members in the future. We encouraged her to remain in contact with cancer genetics on an annual basis so we can update her personal and family histories and let her know of advances in cancer genetics that may benefit this family.   Our contact number was provided. Ms. Rady questions were answered to her satisfaction, and she knows she is welcome to call us at anytime with additional questions or concerns.     Eiman Maret M. Joette Catching, [place], [facility] Genetic Counselor [email or web address] (P) [phone]

## 2020-09-05 NOTE — Pre-Procedure Instructions (Signed)
Surgical Instructions    Your procedure is scheduled on Tuesday June 14th.  Report to [facility] Main Entrance "A" at 12:00 P.M., then check in with the Admitting office.  Call this number if you have problems the morning of surgery:  [phone]   If you have any questions prior to your surgery date call [phone]: Open Monday-Friday 8am-4pm    Remember:  Do not eat after midnight the night before your surgery  You may drink clear liquids until 11:00 a.m. the morning of your surgery.   Clear liquids allowed are: Water, Non-Citrus Juices (without pulp), Carbonated Beverages, Clear Tea, Black Coffee Only, and Gatorade Patient Instructions  . The night before surgery:  o No food after midnight. ONLY clear liquids after midnight  . The day of surgery (if you do NOT have diabetes):  o Drink ONE (1) Pre-Surgery Clear Ensure by 11:00 a.m. the morning of surgery. Drink in one sitting. Do not sip.  o This drink was given to you during your hospital  pre-op appointment visit.  o Nothing else to drink after completing the  Pre-Surgery Clear Ensure.         If you have questions, please contact your surgeon's office.     Take these medicines the morning of surgery with A SIP OF WATER   albuterol (VENTOLIN HFA)- If needed  fluticasone (FLONASE)- If needed  gabapentin (NEURONTIN)  HYDROcodone-acetaminophen (NORCO/VICODIN)- If needed  LORazepam (ATIVAN)- If needed  omeprazole (PRILOSEC)   rosuvastatin (CRESTOR)   sennosides-docusate sodium (SENOKOT-S)- If needed    As of today, STOP taking any Aspirin (unless otherwise instructed by your surgeon) Aleve, Naproxen, Ibuprofen, Motrin, Advil, Goody's, BC's, all herbal medications, fish oil, and all vitamins.          Do not wear jewelry or makeup Do not wear lotions, powders, perfumes, or deodorant. Do not shave 48 hours prior to surgery.   Do not bring valuables to the hospital. DO Not wear nail polish, gel polish, artificial  nails, or any other type of covering on natural nails including finger and toenails. If patients have artificial nails, gel coating, etc. that need to be removed by a nail salon please have this removed prior to surgery or surgery may need to be canceled/delayed if the surgeon/ anesthesia feels like the patient is unable to be adequately monitored.             [facility] is not responsible for any belongings or valuables.  Do NOT Smoke (Tobacco/Vaping) or drink Alcohol 24 hours prior to your procedure If you use a CPAP at night, you may bring all equipment for your overnight stay.   Contacts, glasses, dentures or partials may not be worn into surgery, please bring cases for these belongings   For patients admitted to the hospital, discharge time will be determined by your treatment team.   Patients discharged the day of surgery will not be allowed to drive home, and someone needs to stay with them for 24 hours.    Special instructions:    Oral Hygiene is also important to reduce your risk of infection.  Remember - BRUSH YOUR TEETH THE MORNING OF SURGERY WITH YOUR REGULAR TOOTHPASTE   - Preparing For Surgery  Before surgery, you can play an important role. Because skin is not sterile, your skin needs to be as free of germs as possible. You can reduce the number of germs on your skin by washing with CHG (chlorahexidine gluconate) Soap before surgery.  CHG is an antiseptic cleaner which kills germs and bonds with the skin to continue killing germs even after washing.     Please do not use if you have an allergy to CHG or antibacterial soaps. If your skin becomes reddened/irritated stop using the CHG.  Do not shave (including legs and underarms) for at least 48 hours prior to first CHG shower. It is OK to shave your face.  Please follow these instructions carefully.    1.  Shower the NIGHT BEFORE SURGERY and the MORNING OF SURGERY with CHG Soap.   If you chose to wash your  hair, wash your hair first as usual with your normal shampoo. After you shampoo, rinse your hair and body thoroughly to remove the shampoo.  Then [employer] and genitals (private parts) with your normal soap and rinse thoroughly to remove soap.  2. After that Use CHG Soap as you would any other liquid soap. You can apply CHG directly to the skin and wash gently with a scrungie or a clean washcloth.   3. Apply the CHG Soap to your body ONLY FROM THE NECK DOWN.  Do not use on open wounds or open sores. Avoid contact with your eyes, ears, mouth and genitals (private parts). Wash Face and genitals (private parts)  with your normal soap.   4. Wash thoroughly, paying special attention to the area where your surgery will be performed.  5. Thoroughly rinse your body with warm water from the neck down.  6. DO NOT shower/wash with your normal soap after using and rinsing off the CHG Soap.  7. Pat yourself dry with a CLEAN TOWEL.  8. Wear CLEAN PAJAMAS to bed the night before surgery  9. Place CLEAN SHEETS on your bed the night before your surgery  10. DO NOT SLEEP WITH PETS.   Day of Surgery:  Take a shower with CHG soap. Wear Clean/Comfortable clothing the morning of surgery Do not apply any deodorants/lotions.   Remember to brush your teeth WITH YOUR REGULAR TOOTHPASTE.   Please read over the following fact sheets that you were given.

## 2020-09-06 ENCOUNTER — Encounter (HOSPITAL_COMMUNITY): Payer: Self-pay

## 2020-09-06 ENCOUNTER — Encounter (HOSPITAL_COMMUNITY)
Admission: RE | Admit: 2020-09-06 | Discharge: 2020-09-06 | Disposition: A | Discharge status: Home / Self Care | Payer: PPO | Source: Ambulatory Visit | Attending: General Surgery | Admitting: General Surgery

## 2020-09-06 ENCOUNTER — Other Ambulatory Visit: Payer: Self-pay

## 2020-09-06 DIAGNOSIS — Z01818 Encounter for other preprocedural examination: Secondary | ICD-10-CM | POA: Diagnosis not present

## 2020-09-06 HISTORY — DX: Personal history of other diseases of the respiratory system: Z87.09

## 2020-09-06 NOTE — Progress Notes (Signed)
PCP - Dr. Dennard Schaumann Cardiologist - patient denies  PPM/ICD - n/a Device Orders -  Rep Notified -   Chest x-ray - n/a EKG - 09/06/20 Stress Test - patient denies ECHO - 01/16/2009 Cardiac Cath - patient denies  Sleep Study - patient denies CPAP -   Fasting Blood Sugar - n/a Checks Blood Sugar _____ times a day  Blood Thinner Instructions: n/a Aspirin Instructions: patient told to hold for 5-7 days  ERAS Protcol - clears until 11am the morning of surgery PRE-SURGERY Ensure or G2- ensure given at PAT to be completed by 11:00am DOS  COVID TEST- n/a; ambulatory surgery   Anesthesia review: n/a  Patient denies shortness of breath, fever, cough and chest pain at PAT appointment   All instructions explained to the patient, with a verbal understanding of the material. Patient agrees to go over the instructions while at home for a better understanding. Patient also instructed to self quarantine after being tested for COVID-19. The opportunity to ask questions was provided.

## 2020-09-07 ENCOUNTER — Encounter: Payer: Self-pay | Admitting: Hematology and Oncology

## 2020-09-11 NOTE — H&P (Signed)
Christina Lyons Appointment: 08/16/2020 1:00 PM Location: [facility] Patient #: [id number] DOB: Apr 25, 1940 Undefined / Language: Christina Lyons / Race: Refused to Report/Unreported Female   History of Present Illness Christina Klein MD; 08/16/2020 3:49 PM) The patient is a 80 year old female who presents with breast cancer.Pt is a 80 yo F who presented with 3 weeks of a palpable right breast mass.  her last mammogram had been within the year, around 9 months ago.  Diagnostic imaging showed a 7 mm mass at 12 o'clock at the areolar border.  Core needle biopsy was performed and it showed a grade 2 invasive ductal carcinoma, +/+/-, Ki 67 10%.  She has not had cancer before.    Her sister had breast cancer at age 18 and a maternal aunt at age 61. She is a former smoker who quit 42 years ago.  She had menarche at age 55 and menopause at age 60.  She is a G2P2 wtih first child at age 10.   Last colonoscopy was around 10 years ago.  she is up to date wtih bone density.     [facility] imaging reviewed in [facility] fashion.    core needle biopsy pathology 08/10/2020 Breast, right, needle core biopsy, 12:00 o'clock, areolar margins - INVASIVE DUCTAL CARCINOMA. The carcinoma appears grade 2. The tumor cells are NEGATIVE for Her2 (1+). Estrogen Receptor: 95%, POSITIVE, STRONG STAINING INTENSITY Progesterone Receptor: 95%, POSITIVE, STRONG STAINING INTENSITY Proliferation Marker Ki67: 10%  labs 08/16/20 CBC and CMET essentially normal.     Past Surgical History Christina Slipper, RN; 08/16/2020 8:08 AM) Breast Biopsy   Right. Hysterectomy (not due to cancer) - Complete    Diagnostic Studies History Christina Slipper, RN; 08/16/2020 8:08 AM) Colonoscopy   5-10 years ago Mammogram   within last year Pap Smear   >5 years ago  Medication History Christina Slipper, RN; 08/16/2020 8:07 AM) Medications Reconciled   Social History Christina Slipper, RN; 08/16/2020 8:08 AM) Alcohol use   Occasional alcohol use. Caffeine use   Coffee,  Tea. No drug use   Tobacco use   Former smoker.  Family History Christina Slipper, RN; 08/16/2020 8:08 AM) Alcohol Abuse   Father. Arthritis   Sister. Bleeding disorder   Sister. Breast Cancer   Family Members In General, Sister. Heart disease in female family member before age 16   Respiratory Condition   Sister.  Pregnancy / Birth History Christina Slipper, RN; 08/16/2020 8:08 AM) Age at menarche   50 years. Age of menopause   <45 Gravida   48 Maternal age   73-30 Para   2 Regular periods    Other Problems Christina Slipper, RN; 08/16/2020 8:08 AM) Gastroesophageal Reflux Disease   High blood pressure   Hypercholesterolemia      Review of Systems Christina Slipper RN; 08/16/2020 8:08 AM) General Not Present- Appetite Loss, Chills, Fatigue, Fever, Night Sweats, Lyons Gain and Lyons Loss. Skin Not Present- Change in Wart/Mole, Dryness, Hives, Jaundice, New Lesions, Non-Healing Wounds, Rash and Ulcer. HEENT Present- Seasonal Allergies and Wears glasses/contact lenses. Not Present- Earache, Hearing Loss, Hoarseness, Nose Bleed, Oral Ulcers, Ringing in the Ears, Sinus Pain, Sore Throat, Visual Disturbances and Yellow Eyes. Respiratory Present- Wheezing. Not Present- Bloody sputum, Chronic Cough, Difficulty Breathing and Snoring. Breast Present- Breast Mass and Skin Changes. Not Present- Breast Pain and Nipple Discharge. Cardiovascular Present- Swelling of Extremities. Not Present- Chest Pain, Difficulty Breathing Lying Down, Leg Cramps, Palpitations, Rapid Heart Rate and Shortness of Breath. Gastrointestinal Not Present- Abdominal  Pain, Bloating, Bloody Stool, Change in Bowel Habits, Chronic diarrhea, Constipation, Difficulty Swallowing, Excessive gas, Gets full quickly at meals, Hemorrhoids, Indigestion, Nausea, Rectal Pain and Vomiting. Female Genitourinary Present- Urgency. Not Present- Frequency, Nocturia, Painful Urination and Pelvic Pain. Musculoskeletal Present- Joint Pain. Not Present- Back Pain,  Joint Stiffness, Muscle Pain, Muscle Weakness and Swelling of Extremities. Neurological Not Present- Decreased Memory, Fainting, Headaches, Numbness, Seizures, Tingling, Tremor, Trouble walking and Weakness. Psychiatric Present- Anxiety. Not Present- Bipolar, Change in Sleep Pattern, Depression, Fearful and Frequent crying. Hematology Present- Blood Thinners and Easy Bruising. Not Present- Excessive bleeding, Gland problems, HIV and Persistent Infections.  Vitals Christina Klein MD; 08/16/2020 3:49 PM) 08/16/2020 3:48 PM Lyons: 214.9 lb   Height: 60 in  Body Surface Area: 1.92 m   Body Mass Index: 41.97 kg/m   Temp.: 97.2 F    Pulse: 86 (Regular)    Resp.: 16 (Unlabored)    BP: 159/66(Sitting, Left Arm, Standard)       Physical Exam Christina Klein MD; 08/16/2020 3:51 PM) General Mental Status - Alert. General Appearance - Consistent with stated age. Hydration - Well hydrated. Voice - Normal.  Head and Neck Head - normocephalic, atraumatic with no lesions or palpable masses. Trachea - midline. Thyroid Gland Characteristics - normal size and consistency.  Eye Eyeball - Bilateral - Extraocular movements intact. Sclera/Conjunctiva - Bilateral - No scleral icterus.  Chest and Lung Exam Chest and lung exam reveals  - quiet, even and easy respiratory effort with no use of accessory muscles and on auscultation, normal breath sounds, no adventitious sounds and normal vocal resonance. Inspection Chest Wall - Normal. Back - normal.  Breast Note:  breasts are relatively symmetric in size and are ptotic bilaterally. There is a small palpable mass at 12 o'clock on the right around halfway between the areolar border and the nipple. There is no nipple retraction or nipple discharge. No LAD is present. There is no skin dimpling. she has quite a bit of skin excoriation due to the tape from the biopsy.   Cardiovascular Cardiovascular examination reveals  - normal heart sounds, regular rate and  rhythm with no murmurs and normal pedal pulses bilaterally.  Abdomen Inspection Inspection of the abdomen reveals - No Hernias. Palpation/Percussion Palpation and Percussion of the abdomen reveal - Soft, Non Tender, No Rebound tenderness, No Rigidity (guarding) and No hepatosplenomegaly. Auscultation Auscultation of the abdomen reveals - Bowel sounds normal.  Neurologic Neurologic evaluation reveals  - alert and oriented x 3 with no impairment of recent or remote memory. Mental Status - Normal.  Musculoskeletal Global Assessment  - Note:  no gross deformities.  Normal Exam - Left - Upper Extremity Strength Normal and Lower Extremity Strength Normal. Normal Exam - Right - Upper Extremity Strength Normal and Lower Extremity Strength Normal.  Lymphatic Head & Neck  General Head & Neck Lymphatics: Bilateral - Description - Normal. Axillary  General Axillary Region: Bilateral - Description - Normal. Tenderness - Non Tender. Femoral & Inguinal  Generalized Femoral & Inguinal Lymphatics: Bilateral - Description - No Generalized lymphadenopathy.    Assessment & Plan Christina Klein MD; 08/16/2020 3:53 PM) MALIGNANT NEOPLASM OF UPPER-OUTER QUADRANT OF RIGHT BREAST IN FEMALE, ESTROGEN RECEPTOR POSITIVE (C50.411) Impression: Pt has a new diagnosis of right breast cancer cT1bN0. She is 40 wtih good prognostic panel and small mass, so we are going to forego sentinel node testing. also, given that her mass is palpable, I would not recommend seed placement. i will plan lumpectomy incorporating some  of the skin of the areola. I discussed that her nipple may actually also need to be removed depending on now it looks intraoperatively. At this point, also, the hematoma will have had more time to resolve.  The surgical procedure was described to the patient. I discussed the incision type and location and that we would need radiology involved on with a wire or seed marker and/or sentinel node.  The  risks and benefits of the procedure were described to the patient and she wishes to proceed.  We discussed the risks bleeding, infection, damage to other structures, need for further procedures/surgeries. We discussed the risk of seroma. The patient was advised if the area in the breast in cancer, we may need to go back to surgery for additional tissue to obtain negative margins or for a lymph node biopsy. The patient was advised that these are the most common complications, but that others can occur as well. They were advised against taking aspirin or other anti-inflammatory agents/blood thinners the week before surgery.  52 min spent in evaluation, examination, counseling, and coordination of care. >50% spent in counseling. Current Plans You are being scheduled for surgery - Our schedulers will call you.   You should hear from our office's scheduling department within 5 working days about the location, date, and time of surgery.  We try to make accommodations for patient's preferences in scheduling surgery, but sometimes the OR schedule or the surgeon's schedule prevents [place] from making those accommodations.  If you have not heard from our office [phone]) in 5 working days, call the office and ask for your surgeon's nurse.  If you have other questions about your diagnosis, plan, or surgery, call the office and ask for your surgeon's nurse.  Pt Education - [facility] breast cancer surgery: discussed with patient and provided information. FAMILY HISTORY OF BREAST CANCER (Z80.3) Impression: Genetics referral

## 2020-09-12 ENCOUNTER — Ambulatory Visit (HOSPITAL_COMMUNITY)
Admission: RE | Admit: 2020-09-12 | Discharge: 2020-09-12 | Disposition: A | Discharge status: Home / Self Care | Payer: PPO | Source: Ambulatory Visit | Attending: General Surgery | Admitting: General Surgery

## 2020-09-12 ENCOUNTER — Encounter (HOSPITAL_COMMUNITY): Payer: Self-pay | Admitting: General Surgery

## 2020-09-12 ENCOUNTER — Encounter (HOSPITAL_COMMUNITY)
Admission: RE | Disposition: A | Discharge status: Home / Self Care | Payer: Self-pay | Source: Ambulatory Visit | Attending: General Surgery

## 2020-09-12 ENCOUNTER — Ambulatory Visit (HOSPITAL_COMMUNITY): Payer: PPO | Admitting: Physician Assistant

## 2020-09-12 ENCOUNTER — Ambulatory Visit (HOSPITAL_COMMUNITY): Payer: PPO | Admitting: Anesthesiology

## 2020-09-12 DIAGNOSIS — E7800 Pure hypercholesterolemia, unspecified: Secondary | ICD-10-CM | POA: Insufficient documentation

## 2020-09-12 DIAGNOSIS — E785 Hyperlipidemia, unspecified: Secondary | ICD-10-CM | POA: Diagnosis not present

## 2020-09-12 DIAGNOSIS — M5136 Other intervertebral disc degeneration, lumbar region: Secondary | ICD-10-CM | POA: Diagnosis not present

## 2020-09-12 DIAGNOSIS — Z803 Family history of malignant neoplasm of breast: Secondary | ICD-10-CM | POA: Insufficient documentation

## 2020-09-12 DIAGNOSIS — C50911 Malignant neoplasm of unspecified site of right female breast: Secondary | ICD-10-CM | POA: Diagnosis not present

## 2020-09-12 DIAGNOSIS — C50411 Malignant neoplasm of upper-outer quadrant of right female breast: Secondary | ICD-10-CM | POA: Insufficient documentation

## 2020-09-12 DIAGNOSIS — Z170 Estrogen receptor positive status [ER+]: Secondary | ICD-10-CM | POA: Insufficient documentation

## 2020-09-12 DIAGNOSIS — Z90710 Acquired absence of both cervix and uterus: Secondary | ICD-10-CM | POA: Insufficient documentation

## 2020-09-12 DIAGNOSIS — Z87891 Personal history of nicotine dependence: Secondary | ICD-10-CM | POA: Diagnosis not present

## 2020-09-12 HISTORY — PX: BREAST LUMPECTOMY: SHX2

## 2020-09-12 SURGERY — BREAST LUMPECTOMY
Anesthesia: General | Site: Breast | Laterality: Right

## 2020-09-12 MED ORDER — LACTATED RINGERS IV SOLN: INTRAVENOUS | Status: DC

## 2020-09-12 MED ORDER — EPHEDRINE SULFATE-NACL 50-0.9 MG/10ML-% IV SOSY
PREFILLED_SYRINGE | INTRAVENOUS | Status: DC | PRN
  Administered 2020-09-12: 5 mg via INTRAVENOUS

## 2020-09-12 MED ORDER — ONDANSETRON HCL 4 MG/2ML IJ SOLN
INTRAMUSCULAR | Status: DC | PRN
  Administered 2020-09-12: 4 mg via INTRAVENOUS

## 2020-09-12 MED ORDER — LIDOCAINE HCL 1 % IJ SOLN
INTRAMUSCULAR | Status: DC | PRN
  Administered 2020-09-12: 20 mL via SUBCUTANEOUS

## 2020-09-12 MED ORDER — PROPOFOL 10 MG/ML IV BOLUS
INTRAVENOUS | Status: DC | PRN
  Administered 2020-09-12: 140 mg via INTRAVENOUS

## 2020-09-12 MED ORDER — ORAL CARE MOUTH RINSE: 15.0000 mL | Freq: Once | OROMUCOSAL | Status: AC

## 2020-09-12 MED ORDER — DEXAMETHASONE SODIUM PHOSPHATE 10 MG/ML IJ SOLN
INTRAMUSCULAR | Status: AC
  Filled 2020-09-12: qty 1

## 2020-09-12 MED ORDER — ACETAMINOPHEN 500 MG PO TABS
1000.0000 mg | ORAL_TABLET | Freq: Once | ORAL | Status: AC
  Administered 2020-09-12: 1000 mg via ORAL
  Filled 2020-09-12: qty 2

## 2020-09-12 MED ORDER — DEXAMETHASONE SODIUM PHOSPHATE 10 MG/ML IJ SOLN
INTRAMUSCULAR | Status: DC | PRN
  Administered 2020-09-12: 5 mg via INTRAVENOUS

## 2020-09-12 MED ORDER — BUPIVACAINE-EPINEPHRINE (PF) 0.25% -1:200000 IJ SOLN
INTRAMUSCULAR | Status: AC
  Filled 2020-09-12: qty 30

## 2020-09-12 MED ORDER — CHLORHEXIDINE GLUCONATE CLOTH 2 % EX PADS: 6.0000 | MEDICATED_PAD | Freq: Once | CUTANEOUS | Status: DC

## 2020-09-12 MED ORDER — LIDOCAINE HCL (PF) 2 % IJ SOLN
INTRAMUSCULAR | Status: DC | PRN
  Administered 2020-09-12: 100 mg via INTRADERMAL

## 2020-09-12 MED ORDER — FENTANYL CITRATE (PF) 250 MCG/5ML IJ SOLN
INTRAMUSCULAR | Status: AC
  Filled 2020-09-12: qty 5

## 2020-09-12 MED ORDER — CHLORHEXIDINE GLUCONATE 0.12 % MT SOLN
OROMUCOSAL | Status: AC
  Administered 2020-09-12: 15 mL via OROMUCOSAL
  Filled 2020-09-12: qty 15

## 2020-09-12 MED ORDER — 0.9 % SODIUM CHLORIDE (POUR BTL) OPTIME
TOPICAL | Status: DC | PRN
  Administered 2020-09-12: 1000 mL

## 2020-09-12 MED ORDER — CEFAZOLIN SODIUM-DEXTROSE 2-4 GM/100ML-% IV SOLN
2.0000 g | INTRAVENOUS | Status: AC
  Administered 2020-09-12: 2 g via INTRAVENOUS

## 2020-09-12 MED ORDER — ENSURE PRE-SURGERY PO LIQD: 296.0000 mL | Freq: Once | ORAL | Status: DC

## 2020-09-12 MED ORDER — EPHEDRINE 5 MG/ML INJ
INTRAVENOUS | Status: AC
  Filled 2020-09-12: qty 10

## 2020-09-12 MED ORDER — FENTANYL CITRATE (PF) 250 MCG/5ML IJ SOLN
INTRAMUSCULAR | Status: DC | PRN
  Administered 2020-09-12 (×2): 50 ug via INTRAVENOUS

## 2020-09-12 MED ORDER — CHLORHEXIDINE GLUCONATE 0.12 % MT SOLN: 15.0000 mL | Freq: Once | OROMUCOSAL | Status: AC

## 2020-09-12 MED ORDER — ACETAMINOPHEN 500 MG PO TABS: 1000.0000 mg | ORAL_TABLET | ORAL | Status: DC

## 2020-09-12 MED ORDER — LIDOCAINE HCL (PF) 2 % IJ SOLN
INTRAMUSCULAR | Status: AC
  Filled 2020-09-12: qty 5

## 2020-09-12 MED ORDER — LIDOCAINE HCL (PF) 1 % IJ SOLN
INTRAMUSCULAR | Status: AC
  Filled 2020-09-12: qty 30

## 2020-09-12 MED ORDER — CEFAZOLIN SODIUM-DEXTROSE 2-4 GM/100ML-% IV SOLN
INTRAVENOUS | Status: AC
  Filled 2020-09-12: qty 100

## 2020-09-12 MED ORDER — PROPOFOL 10 MG/ML IV BOLUS
INTRAVENOUS | Status: AC
  Filled 2020-09-12: qty 20

## 2020-09-12 MED ORDER — HYDROCODONE-ACETAMINOPHEN 5-325 MG PO TABS: 1.0000 | ORAL_TABLET | Freq: Four times a day (QID) | ORAL | 0 refills | Status: DC | PRN

## 2020-09-12 MED ORDER — ONDANSETRON HCL 4 MG/2ML IJ SOLN
INTRAMUSCULAR | Status: AC
  Filled 2020-09-12: qty 2

## 2020-09-12 SURGICAL SUPPLY — 43 items
ADH SKN CLS APL DERMABOND .7 (GAUZE/BANDAGES/DRESSINGS) ×1
APL PRP STRL LF DISP 70% ISPRP (MISCELLANEOUS) ×1
BINDER BREAST LRG (GAUZE/BANDAGES/DRESSINGS) IMPLANT
BINDER BREAST XLRG (GAUZE/BANDAGES/DRESSINGS) ×1 IMPLANT
BLADE SURG 10 STRL SS (BLADE) ×2 IMPLANT
CANISTER SUCT 3000ML PPV (MISCELLANEOUS) ×1 IMPLANT
CHLORAPREP W/TINT 26 (MISCELLANEOUS) ×2 IMPLANT
CLIP VESOCCLUDE LG 6/CT (CLIP) ×2 IMPLANT
CLIP VESOCCLUDE MED 6/CT (CLIP) ×2 IMPLANT
COVER PROBE W GEL 5X96 (DRAPES) ×1 IMPLANT
COVER SURGICAL LIGHT HANDLE (MISCELLANEOUS) ×2 IMPLANT
COVER WAND RF STERILE (DRAPES) ×2 IMPLANT
DERMABOND ADVANCED (GAUZE/BANDAGES/DRESSINGS) ×1
DERMABOND ADVANCED .7 DNX12 (GAUZE/BANDAGES/DRESSINGS) ×1 IMPLANT
DEVICE DUBIN SPECIMEN MAMMOGRA (MISCELLANEOUS) ×1 IMPLANT
DRAPE CHEST BREAST 15X10 FENES (DRAPES) ×2 IMPLANT
DRSG PAD ABDOMINAL 8X10 ST (GAUZE/BANDAGES/DRESSINGS) ×1 IMPLANT
ELECT COATED BLADE 2.86 ST (ELECTRODE) ×2 IMPLANT
ELECT REM PT RETURN 9FT ADLT (ELECTROSURGICAL) ×2
ELECTRODE REM PT RTRN 9FT ADLT (ELECTROSURGICAL) ×1 IMPLANT
GAUZE SPONGE 4X4 12PLY STRL LF (GAUZE/BANDAGES/DRESSINGS) ×1 IMPLANT
GLOVE BIO SURGEON STRL SZ 6 (GLOVE) ×2 IMPLANT
GLOVE SURG UNDER LTX SZ6.5 (GLOVE) ×2 IMPLANT
GOWN STRL REUS W/ TWL LRG LVL3 (GOWN DISPOSABLE) ×1 IMPLANT
GOWN STRL REUS W/TWL 2XL LVL3 (GOWN DISPOSABLE) ×2 IMPLANT
GOWN STRL REUS W/TWL LRG LVL3 (GOWN DISPOSABLE) ×2
KIT BASIN OR (CUSTOM PROCEDURE TRAY) ×2 IMPLANT
KIT MARKER MARGIN INK (KITS) ×2 IMPLANT
LIGHT WAVEGUIDE WIDE FLAT (MISCELLANEOUS) IMPLANT
NDL HYPO 25GX1X1/2 BEV (NEEDLE) ×1 IMPLANT
NEEDLE HYPO 25GX1X1/2 BEV (NEEDLE) ×2 IMPLANT
NS IRRIG 1000ML POUR BTL (IV SOLUTION) ×1 IMPLANT
PACK GENERAL/GYN (CUSTOM PROCEDURE TRAY) ×2 IMPLANT
STRIP CLOSURE SKIN 1/2X4 (GAUZE/BANDAGES/DRESSINGS) ×2 IMPLANT
SUT MNCRL AB 4-0 PS2 18 (SUTURE) ×2 IMPLANT
SUT SILK 2 0 SH (SUTURE) ×1 IMPLANT
SUT VIC AB 2-0 SH 27 (SUTURE) ×2
SUT VIC AB 2-0 SH 27XBRD (SUTURE) ×1 IMPLANT
SUT VIC AB 3-0 SH 27 (SUTURE) ×2
SUT VIC AB 3-0 SH 27X BRD (SUTURE) ×1 IMPLANT
SYR CONTROL 10ML LL (SYRINGE) ×2 IMPLANT
TOWEL GREEN STERILE (TOWEL DISPOSABLE) ×2 IMPLANT
TOWEL GREEN STERILE FF (TOWEL DISPOSABLE) ×2 IMPLANT

## 2020-09-12 NOTE — Transfer of Care (Signed)
Immediate Anesthesia Transfer of Care Note  Patient: Christina Lyons  Procedure(s) Performed: RIGHT BREAST LUMPECTOMY (Right: Breast)  Patient Location: PACU  Anesthesia Type:General  Level of Consciousness: drowsy  Airway & Oxygen Therapy: Patient Spontanous Breathing  Post-op Assessment: Report given to RN and Post -op Vital signs reviewed and stable  Post vital signs: Reviewed and stable  Last Vitals:  Vitals Value Taken Time  BP 150/81 09/12/20 1458  Temp    Pulse    Resp 12 09/12/20 1500  SpO2    Vitals shown include unvalidated device data.  Last Pain:  Vitals:   09/12/20 1213  TempSrc: Oral         Complications: No notable events documented.

## 2020-09-12 NOTE — Anesthesia Procedure Notes (Signed)
Procedure Name: LMA Insertion Date/Time: 09/12/2020 1:56 PM Performed by: Barrington Ellison, CRNA Pre-anesthesia Checklist: Patient identified, Emergency Drugs available, Suction available and Patient being monitored Patient Re-evaluated:Patient Re-evaluated prior to induction Oxygen Delivery Method: Circle System Utilized Preoxygenation: Pre-oxygenation with 100% oxygen Induction Type: IV induction Ventilation: Mask ventilation without difficulty LMA: LMA inserted LMA Size: 4.0 Number of attempts: 1 Airway Equipment and Method: Bite block Placement Confirmation: positive ETCO2 Tube secured with: Tape Dental Injury: Teeth and Oropharynx as per pre-operative assessment

## 2020-09-12 NOTE — Op Note (Signed)
Right Breast Lumpectomy  Indications: This patient presents with history of right breast cancer with clinically negative axillary lymph node exam.  Pre-operative Diagnosis: right breast cancer, grade 2 invasive ductal carcinoma, ER+/PR+/Her 2-, [id number]  Post-operative Diagnosis: right breast cancer  Surgeon: Stark Klein   Assistants: Ewell Poe, RNFA  Anesthesia: General LMA anesthesia  ASA Class: 3  Procedure Details  The patient was seen in the Holding Room. The risks, benefits, complications, treatment options, and expected outcomes were discussed with the patient. The possibilities of reaction to medication, pulmonary aspiration, bleeding, infection, the need for additional procedures, failure to diagnose a condition, and creating a complication requiring transfusion or operation were discussed with the patient. The patient concurred with the proposed plan, giving informed consent.  The site of surgery properly noted/marked. The patient was taken to Operating Room # 2, identified as Christina Lyons and the procedure verified as Right Breast Lumpectomy. A Time Out was held and the above information confirmed.   After induction of anesthesia, the right arm, breast, and chest were prepped and draped in standard fashion.  The lumpectomy was performed by creating a crescent shaped incision on the superomedial areola.  This portion of skin was taken with the mass. Skin hooks were used to elevate the skin and the cautery was used to dissect around the mass.   The specimen was marked with the margin marker paint kit.  The specimen was placed in the faxatron to confirm presence on the biopsy clip.  Hemostasis was achieved with cautery.  Large clips were placed on the specimen cavity edges for radiation.   The wound was irrigated and closed with a 3-0 Vicryl deep dermal interrupted and a 4-0 Monocryl subcuticular closure in layers.  Sterile dressings were applied. At the end of the operation, all  sponge, instrument, and needle counts were correct.  Findings: grossly clear surgical margins  Estimated Blood Loss:  Minimal         Specimens: right breast mass                Complications:  None; patient tolerated the procedure well.         Disposition: PACU - hemodynamically stable.         Condition: stable

## 2020-09-12 NOTE — Discharge Instructions (Addendum)
[facility] Office Phone Number [phone]  BREAST BIOPSY/ PARTIAL MASTECTOMY: POST OP INSTRUCTIONS  Always review your discharge instruction sheet given to you by the facility where your surgery was performed.  IF YOU HAVE DISABILITY OR FAMILY LEAVE FORMS, YOU MUST BRING THEM TO THE OFFICE FOR PROCESSING.  DO NOT GIVE THEM TO YOUR DOCTOR.  A prescription for pain medication may be given to you upon discharge.  Take your pain medication as prescribed, if needed.  If narcotic pain medicine is not needed, then you may take acetaminophen (Tylenol) or ibuprofen (Advil) as needed. Take your usually prescribed medications unless otherwise directed If you need a refill on your pain medication, please contact your pharmacy.  They will contact our office to request authorization.  Prescriptions will not be filled after 5pm or on week-ends. You should eat very light the first 24 hours after surgery, such as soup, crackers, pudding, etc.  Resume your normal diet the day after surgery. Most patients will experience some swelling and bruising in the breast.  Ice packs and a good support bra will help.  Swelling and bruising can take several days to resolve.  It is common to experience some constipation if taking pain medication after surgery.  Increasing fluid intake and taking a stool softener will usually help or prevent this problem from occurring.  A mild laxative (Milk of Magnesia or Miralax) should be taken according to package directions if there are no bowel movements after 48 hours. Unless discharge instructions indicate otherwise, you may remove your bandages 48 hours after surgery, and you may shower at that time.  You may have steri-strips (small skin tapes) in place directly over the incision.  These strips should be left on the skin for 7-10 days.   Any sutures or staples will be removed at the office during your follow-up visit. ACTIVITIES:  You may resume regular daily activities  (gradually increasing) beginning the next day.  Wearing a good support bra or sports bra (or the breast binder) minimizes pain and swelling.  You may have sexual intercourse when it is comfortable. You may drive when you no longer are taking prescription pain medication, you can comfortably wear a seatbelt, and you can safely maneuver your car and apply brakes. RETURN TO WORK:  __________1 week_______________ You should see your doctor in the office for a follow-up appointment approximately two weeks after your surgery.  Your doctor's nurse will typically make your follow-up appointment when she calls you with your pathology report.  Expect your pathology report 2-3 business days after your surgery.  You may call to check if you do not hear from us after three days.   WHEN TO CALL YOUR DOCTOR: Fever over 101.0 Nausea and/or vomiting. Extreme swelling or bruising. Continued bleeding from incision. Increased pain, redness, or drainage from the incision.  The clinic staff is available to answer your questions during regular business hours.  Please don't hesitate to call and ask to speak to one of the nurses for clinical concerns.  If you have a medical emergency, go to the nearest emergency room or call 911.  A surgeon from [facility] is always on call at the hospital.  For further questions, please visit [email or web address]   

## 2020-09-12 NOTE — Interval H&P Note (Signed)
History and Physical Interval Note:  09/12/2020 1:24 PM  Christina Lyons  has presented today for surgery, with the diagnosis of RIGHT BREAST CANCER.  The various methods of treatment have been discussed with the patient and family. After consideration of risks, benefits and other options for treatment, the patient has consented to  Procedure(s): RIGHT BREAST LUMPECTOMY (Right) as a surgical intervention.  The patient's history has been reviewed, patient examined, no change in status, stable for surgery.  I have reviewed the patient's chart and labs.  Questions were answered to the patient's satisfaction.     Stark Klein

## 2020-09-12 NOTE — Anesthesia Preprocedure Evaluation (Addendum)
Anesthesia Evaluation  Patient identified by MRN, date of birth, ID band Patient awake    Reviewed: Allergy & Precautions, NPO status , Patient's Chart, lab work & pertinent test results  History of Anesthesia Complications Negative for: history of anesthetic complications  Airway Mallampati: I       Dental no notable dental hx.    Pulmonary former smoker,    Pulmonary exam normal        Cardiovascular hypertension, Pt. on medications Normal cardiovascular exam     Neuro/Psych negative neurological ROS  negative psych ROS   GI/Hepatic Neg liver ROS, GERD  Medicated and Controlled,  Endo/Other  Morbid obesity  Renal/GU negative Renal ROS  negative genitourinary   Musculoskeletal  (+) Arthritis ,   Abdominal (+) + obese,   Peds  Hematology negative hematology ROS (+)   Anesthesia Other Findings Right breast ca  Reproductive/Obstetrics negative OB ROS                            Anesthesia Physical Anesthesia Plan  ASA: 3  Anesthesia Plan: General   Post-op Pain Management:    Induction: Intravenous  PONV Risk Score and Plan: 3 and Treatment may vary due to age or medical condition, Ondansetron and Dexamethasone  Airway Management Planned: LMA  Additional Equipment: None  Intra-op Plan:   Post-operative Plan: Extubation in OR  Informed Consent: I have reviewed the patients History and Physical, chart, labs and discussed the procedure including the risks, benefits and alternatives for the proposed anesthesia with the patient or authorized representative who has indicated his/her understanding and acceptance.     Dental advisory given  Plan Discussed with: CRNA  Anesthesia Plan Comments:        Anesthesia Quick Evaluation

## 2020-09-13 ENCOUNTER — Encounter (HOSPITAL_COMMUNITY): Payer: Self-pay | Admitting: General Surgery

## 2020-09-13 NOTE — Anesthesia Postprocedure Evaluation (Signed)
Anesthesia Post Note  Patient: Christina Lyons  Procedure(s) Performed: RIGHT BREAST LUMPECTOMY (Right: Breast)     Patient location during evaluation: PACU Anesthesia Type: General Level of consciousness: awake and alert Pain management: pain level controlled Vital Signs Assessment: post-procedure vital signs reviewed and stable Respiratory status: spontaneous breathing, nonlabored ventilation, respiratory function stable and patient connected to nasal cannula oxygen Cardiovascular status: blood pressure returned to baseline and stable Postop Assessment: no apparent nausea or vomiting Anesthetic complications: no   No notable events documented.  Last Vitals:  Vitals:   09/12/20 1545 09/12/20 1553  BP: 136/78 137/84  Pulse: 80 84  Resp: 13 14  Temp:  (!) 36.1 C  SpO2: 97% 96%    Last Pain:  Vitals:   09/12/20 1553  TempSrc:   PainSc: 0-No pain                 Belenda Cruise P Khyran Riera

## 2020-09-15 ENCOUNTER — Encounter: Payer: Self-pay | Admitting: Hematology and Oncology

## 2020-09-18 ENCOUNTER — Encounter: Payer: Self-pay | Admitting: *Deleted

## 2020-09-20 ENCOUNTER — Telehealth: Payer: Self-pay | Admitting: Family Medicine

## 2020-09-20 NOTE — Assessment & Plan Note (Signed)
08/10/2020:Palpable right breast lump 12 o'clock position near areola: 0.5 cm by mammogram and 0.7 cm by ultrasound: Biopsy grade 2 IDC ER 95% PR 95%, Ki-67 10%, HER2 negative  Pathology and radiology counseling: Discussed with the patient, the details of pathology including the type of breast cancer,the clinical staging, the significance of ER, PR and HER-2/neu receptors and the implications for treatment. After reviewing the pathology in detail, we proceeded to discuss the different treatment options between surgery, radiation, chemotherapy, antiestrogen therapies.  Treatment plan: 1.  Axillary ultrasound to verify there are no enlarged lymph nodes 2. Right Lumpectomy: 09/14/20: Grade 2 IDC 1.2 cm, Margins Neg, ER 95%, PR 95%, Ki 67 1%, Her-2 Neg 3. +/- Adjuvant radiation 4.  Adjuvant antiestrogen therapy with anastrozole 1 mg daily x5 years Genetic testing will also be performed  Pathology counseling: I discussed the final pathology report of the patient provided  a copy of this report. I discussed the margins as well as lymph node surgeries. We also discussed the final staging along with previously performed ER/PR and HER-2/neu testing.

## 2020-09-20 NOTE — Progress Notes (Signed)
Patient Care Team: [name], MD as PCP - General (Family Medicine) Mauro Kaufmann, RN as Oncology Nurse Navigator Rockwell Germany, RN as Oncology Nurse Navigator Stark Klein, MD as Consulting Physician (General Surgery) Nicholas Lose, MD as Consulting Physician (Hematology and Oncology) Eppie Gibson, MD as Attending Physician (Radiation Oncology)  DIAGNOSIS:    ICD-10-CM   1. Malignant neoplasm of upper-outer quadrant of right breast in female, estrogen receptor positive ([place])  C50.411    Z17.0       SUMMARY OF ONCOLOGIC HISTORY: Oncology History  Malignant neoplasm of upper-outer quadrant of right breast in female, estrogen receptor positive ([place])  08/10/2020 Initial Diagnosis   Palpable right breast lump 12 o'clock position near areola: 0.5 cm by mammogram and 0.7 cm by ultrasound: Biopsy grade 2 IDC ER 95% PR 95%, Ki-67 10%, HER2 negative   08/16/2020 Cancer Staging   Staging form: Breast, AJCC 8th Edition - Clinical stage from 08/16/2020: Stage IA (cT1b, cN0, cM0, G2, ER+, PR+, HER2-) - Signed by Nicholas Lose, MD on 08/16/2020  Histologic grading system: 3 grade system    08/30/2020 Genetic Testing   Negative hereditary cancer genetic testing: no pathogenic variants detected in [facility]RNAinsight Panel.  The report date is August 30, 2020.   The [facility]+RNAinsight panel offered by [facility] includes sequencing and rearrangement analysis for the following 47 genes:  APC, ATM, AXIN2, BARD1, BMPR1A, BRCA1, BRCA2, BRIP1, CDH1, CDK4, CDKN2A, CHEK2, DICER1, EPCAM, GREM1, HOXB13, MEN1, MLH1, MSH2, MSH3, MSH6, MUTYH, NBN, NF1, NF2, NTHL1, PALB2, PMS2, POLD1, POLE, PTEN, RAD51C, RAD51D, RECQL, RET, SDHA, SDHAF2, SDHB, SDHC, SDHD, SMAD4, SMARCA4, STK11, TP53, TSC1, TSC2, and VHL.  RNA data is routinely analyzed for use in variant interpretation for all genes.     CHIEF COMPLIANT: Follow-up for left breast cancer  INTERVAL HISTORY: Christina Lyons is a 80 y.o. with above-mentioned history of left breast cancer. Right lumpectomy on 09/12/20 showed invasive ductal carcinoma, Nottingham grade 2 of 3, 1.2 cm, and DCIS. She reports to the clinic today for follow-up and to discuss the pathology report.  She is healing and recovering very well from the recent surgery.  Does not complain of any pain.  ALLERGIES:  is allergic to sulfa antibiotics and tape.  MEDICATIONS:  Current Outpatient Medications  Medication Sig Dispense Refill   albuterol (VENTOLIN HFA) 108 (90 Base) MCG/ACT inhaler Inhale 1 puff into the lungs every 6 (six) hours as needed for wheezing or shortness of breath.     aspirin EC 81 MG tablet Take 81 mg by mouth at bedtime.      cetirizine (ZYRTEC) 10 MG tablet Take 10 mg by mouth at bedtime.     Cholecalciferol (VITAMIN D) 50 MCG (2000 UT) CAPS Take 4,000 Units by mouth daily.     fluticasone (FLONASE) 50 MCG/ACT nasal spray Place 1 spray into both nostrils every 12 (twelve) hours as needed for allergies.      gabapentin (NEURONTIN) 300 MG capsule Take 300 mg by mouth 2 (two) times daily.     HYDROcodone-acetaminophen (NORCO/VICODIN) 5-325 MG tablet Take 1 tablet by mouth every 6 (six) hours as needed. Chronic Pain. Dx: G89.4 60 tablet 0   LORazepam (ATIVAN) 1 MG tablet TAKE 1 TABLET BY MOUTH TWICE A DAY AS NEEDED FOR ANXIETY (Patient taking differently: Take 1 mg by mouth 2 (two) times daily.) 60 tablet 0   losartan (COZAAR) 50 MG tablet TAKE 1 TABLET BY MOUTH EVERY DAY (Patient  taking differently: Take 50 mg by mouth daily. TAKE 1 TABLET BY MOUTH EVERY DAY.) 90 tablet 3   meloxicam (MOBIC) 15 MG tablet Take 15 mg by mouth daily.     montelukast (SINGULAIR) 10 MG tablet Take 10 mg by mouth at bedtime.     Multiple Vitamin (MULTIVITAMIN WITH MINERALS) TABS tablet Take 1 tablet by mouth daily with supper.     omeprazole (PRILOSEC) 20 MG capsule TAKE 1 CAPSULE BY MOUTH TWICE A DAY (Patient taking differently: Take 20 mg by  mouth 2 (two) times daily before a meal.) 180 capsule 3   rosuvastatin (CRESTOR) 10 MG tablet Take 1 tablet (10 mg total) by mouth daily. 90 tablet 3   sennosides-docusate sodium (SENOKOT-S) 8.6-50 MG tablet Take 1 tablet by mouth daily as needed for constipation.     vitamin C (ASCORBIC ACID) 500 MG tablet Take 500 mg by mouth daily.     zinc gluconate 50 MG tablet Take 50 mg by mouth daily.     Current Facility-Administered Medications  Medication Dose Route Frequency Provider Last Rate Last Admin   denosumab (PROLIA) injection 60 mg  60 mg Subcutaneous Q6 months [name], MD   60 mg at 12/29/18 1442    PHYSICAL EXAMINATION: ECOG PERFORMANCE STATUS: 1 - Symptomatic but completely ambulatory  Vitals:   09/21/20 1110  BP: (!) 146/69  Pulse: 82  Resp: 14  Temp: 97.7 F (36.5 C)  SpO2: 97%   Filed Weights   09/21/20 1110  Weight: 215 lb 3.2 oz (97.6 kg)      LABORATORY DATA:  I have reviewed the data as listed CMP Latest Ref Rng & Units 08/16/2020 06/30/2020 03/16/2020  Glucose 70 - 99 mg/dL 88 88 83  BUN 8 - 23 mg/dL 23 27(H) 24  Creatinine 0.44 - 1.00 mg/dL 0.67 0.61 0.55(L)  Sodium 135 - 145 mmol/L 142 143 141  Potassium 3.5 - 5.1 mmol/L 3.9 4.2 3.9  Chloride 98 - 111 mmol/L 103 106 104  CO2 22 - 32 mmol/L 29 23 28   Calcium 8.9 - 10.3 mg/dL 9.4 9.3 9.1  Total Protein 6.5 - 8.1 g/dL 6.9 6.2 6.1  Total Bilirubin 0.3 - 1.2 mg/dL 0.5 0.5 0.5  Alkaline Phos 38 - 126 U/L 89 - -  AST 15 - 41 U/L 20 16 14   ALT 0 - 44 U/L 13 10 11     Lab Results  Component Value Date   WBC 7.4 08/16/2020   HGB 14.2 08/16/2020   HCT 42.5 08/16/2020   MCV 100.7 (H) 08/16/2020   PLT 151 08/16/2020   NEUTROABS 5.3 08/16/2020    ASSESSMENT & PLAN:  Malignant neoplasm of upper-outer quadrant of right breast in female, estrogen receptor positive ([facility]) 08/10/2020:Palpable right breast lump 12 o'clock position near areola: 0.5 cm by mammogram and 0.7 cm by ultrasound: Biopsy grade 2 IDC  ER 95% PR 95%, Ki-67 10%, HER2 negative   Pathology and radiology counseling: Discussed with the patient, the details of pathology including the type of breast cancer,the clinical staging, the significance of ER, PR and HER-2/neu receptors and the implications for treatment. After reviewing the pathology in detail, we proceeded to discuss the different treatment options between surgery, radiation, chemotherapy, antiestrogen therapies.   Treatment plan: 1.  Right Lumpectomy: 09/14/20: Grade 2 IDC 1.2 cm, Margins Neg, ER 95%, PR 95%, Ki 67 1%, Her-2 Neg 3. +/- Adjuvant radiation (patient informed me that she does not want to do radiation).  She  has an appointment with radiation oncology coming up. 4.  Adjuvant antiestrogen therapy with Letrozole 2.5 mg daily x5 years Genetic testing will also be performed   Pathology counseling: I discussed the final pathology report of the patient provided  a copy of this report. I discussed the margins as well as lymph node surgeries. We also discussed the final staging along with previously performed ER/PR and HER-2/neu testing.  Return to clinic in 3 months for survivorship care plan visit   No orders of the defined types were placed in this encounter.  The patient has a good understanding of the overall plan. she agrees with it. she will call with any problems that may develop before the next visit here.  Total time spent: 30 mins including face to face time and time spent for planning, charting and coordination of care  Rulon Eisenmenger, MD, MPH 09/21/2020  I, Thana Ates, am acting as scribe for Dr. Nicholas Lose.  I have reviewed the above documentation for accuracy and completeness, and I agree with the above.

## 2020-09-20 NOTE — Telephone Encounter (Signed)
Left message for patient to call back and schedule Medicare Annual Wellness Visit (AWV) in office.   If not able to come in office, please offer to do virtually or by telephone.   Last AWV: 05/22/2017  Please schedule at anytime with [facility]-Nurse Health Advisor.  If any questions, please contact me at [phone]

## 2020-09-21 ENCOUNTER — Telehealth: Payer: Self-pay | Admitting: Hematology and Oncology

## 2020-09-21 ENCOUNTER — Inpatient Hospital Stay: Payer: PPO | Attending: Hematology and Oncology | Admitting: Hematology and Oncology

## 2020-09-21 ENCOUNTER — Encounter: Payer: Self-pay | Admitting: *Deleted

## 2020-09-21 ENCOUNTER — Other Ambulatory Visit: Payer: Self-pay

## 2020-09-21 DIAGNOSIS — Z79899 Other long term (current) drug therapy: Secondary | ICD-10-CM | POA: Insufficient documentation

## 2020-09-21 DIAGNOSIS — Z7982 Long term (current) use of aspirin: Secondary | ICD-10-CM | POA: Insufficient documentation

## 2020-09-21 DIAGNOSIS — Z170 Estrogen receptor positive status [ER+]: Secondary | ICD-10-CM | POA: Diagnosis not present

## 2020-09-21 DIAGNOSIS — C50411 Malignant neoplasm of upper-outer quadrant of right female breast: Secondary | ICD-10-CM | POA: Diagnosis not present

## 2020-09-21 MED ORDER — LETROZOLE 2.5 MG PO TABS: 2.5000 mg | ORAL_TABLET | Freq: Every day | ORAL | 3 refills | Status: DC

## 2020-09-21 NOTE — Telephone Encounter (Signed)
Scheduled appointment per 06/23 los. Patient is aware.

## 2020-09-27 LAB — SURGICAL PATHOLOGY

## 2020-10-04 DIAGNOSIS — M5136 Other intervertebral disc degeneration, lumbar region: Secondary | ICD-10-CM | POA: Insufficient documentation

## 2020-10-04 DIAGNOSIS — M810 Age-related osteoporosis without current pathological fracture: Secondary | ICD-10-CM | POA: Insufficient documentation

## 2020-10-05 ENCOUNTER — Other Ambulatory Visit: Payer: Self-pay

## 2020-10-05 ENCOUNTER — Ambulatory Visit (INDEPENDENT_AMBULATORY_CARE_PROVIDER_SITE_OTHER): Payer: PPO

## 2020-10-05 DIAGNOSIS — Z0001 Encounter for general adult medical examination with abnormal findings: Secondary | ICD-10-CM

## 2020-10-05 DIAGNOSIS — M810 Age-related osteoporosis without current pathological fracture: Secondary | ICD-10-CM | POA: Diagnosis not present

## 2020-10-05 DIAGNOSIS — Z0000 Encounter for general adult medical examination without abnormal findings: Secondary | ICD-10-CM

## 2020-10-05 NOTE — Progress Notes (Signed)
Subjective:   Christina Lyons is a 80 y.o. female who presents for Medicare Annual (Subsequent) preventive examination.  I connected with Suzanna Obey  today by telephone and verified that I am speaking with the correct person using two identifiers. Location patient: home Location provider: work Persons participating in the virtual visit: patient, provider.   I discussed the limitations, risks, security and privacy concerns of performing an evaluation and management service by telephone and the availability of in person appointments. I also discussed with the patient that there may be a patient responsible charge related to this service. The patient expressed understanding and verbally consented to this telephonic visit.    Interactive audio and video telecommunications were attempted between this provider and patient, however failed, due to patient having technical difficulties OR patient did not have access to video capability.  We continued and completed visit with audio only.     Review of Systems    N/A  Cardiac Risk Factors include: advanced age (>75men, >89 women);dyslipidemia;hypertension     Objective:    Today's Vitals   There is no height or weight on file to calculate BMI.  Advanced Directives 10/05/2020 09/06/2020 04/17/2019  Does Patient Have a Medical Advance Directive? Yes Yes Yes  Type of Paramedic of [place];Living will Living will Living will  Does patient want to make changes to medical advance directive? No - Patient declined No - Patient declined No - Patient declined  Copy of [place] in Chart? No - copy requested - -    Current Medications (verified) Outpatient Encounter Medications as of 10/05/2020  Medication Sig   albuterol (VENTOLIN HFA) 108 (90 Base) MCG/ACT inhaler Inhale 1 puff into the lungs every 6 (six) hours as needed for wheezing or shortness of breath.   aspirin EC 81 MG tablet Take 81 mg by mouth at  bedtime.    cetirizine (ZYRTEC) 10 MG tablet Take 10 mg by mouth at bedtime.   Cholecalciferol (VITAMIN D) 50 MCG (2000 UT) CAPS Take 4,000 Units by mouth daily.   fluticasone (FLONASE) 50 MCG/ACT nasal spray Place 1 spray into both nostrils every 12 (twelve) hours as needed for allergies.    gabapentin (NEURONTIN) 300 MG capsule Take 300 mg by mouth 2 (two) times daily.   HYDROcodone-acetaminophen (NORCO/VICODIN) 5-325 MG tablet Take 1 tablet by mouth every 6 (six) hours as needed. Chronic Pain. Dx: G89.4   letrozole (FEMARA) 2.5 MG tablet Take 1 tablet (2.5 mg total) by mouth daily.   LORazepam (ATIVAN) 1 MG tablet TAKE 1 TABLET BY MOUTH TWICE A DAY AS NEEDED FOR ANXIETY (Patient taking differently: Take 1 mg by mouth 2 (two) times daily.)   losartan (COZAAR) 50 MG tablet TAKE 1 TABLET BY MOUTH EVERY DAY (Patient taking differently: Take 50 mg by mouth daily. TAKE 1 TABLET BY MOUTH EVERY DAY.)   meloxicam (MOBIC) 15 MG tablet Take 15 mg by mouth daily.   montelukast (SINGULAIR) 10 MG tablet Take 10 mg by mouth at bedtime.   Multiple Vitamin (MULTIVITAMIN WITH MINERALS) TABS tablet Take 1 tablet by mouth daily with supper.   omeprazole (PRILOSEC) 20 MG capsule TAKE 1 CAPSULE BY MOUTH TWICE A DAY (Patient taking differently: Take 20 mg by mouth 2 (two) times daily before a meal.)   rosuvastatin (CRESTOR) 10 MG tablet Take 1 tablet (10 mg total) by mouth daily.   sennosides-docusate sodium (SENOKOT-S) 8.6-50 MG tablet Take 1 tablet by mouth daily as needed  for constipation.   zinc gluconate 50 MG tablet Take 50 mg by mouth daily.   vitamin C (ASCORBIC ACID) 500 MG tablet Take 500 mg by mouth daily. (Patient not taking: Reported on 10/05/2020)   Facility-Administered Encounter Medications as of 10/05/2020  Medication   denosumab (PROLIA) injection 60 mg    Allergies (verified) Sulfa antibiotics and Tape   History: Past Medical History:  Diagnosis Date   DDD (degenerative disc disease), lumbar     Family history of breast cancer 08/16/2020   GERD (gastroesophageal reflux disease)    History of bronchitis    Hyperlipidemia    Hypertension    Osteomyelitis ([place]) 1946   both legs   Osteoporosis    Other idiopathic scoliosis, thoracolumbar region    Past Surgical History:  Procedure Laterality Date   ABDOMINAL HYSTERECTOMY     BREAST LUMPECTOMY Right 09/12/2020   Procedure: RIGHT BREAST LUMPECTOMY;  Surgeon: Stark Klein, MD;  Location: [facility] OR;  Service: General;  Laterality: Right;   CHOLECYSTECTOMY     JOINT REPLACEMENT     right knee   Family History  Problem Relation Age of Onset   Asthma Mother    Asthma Sister    Heart failure Father    Breast cancer Sister        contralateral; dx 57; dx 63   Breast cancer Maternal Aunt 1   Cancer Paternal Aunt        unknown "female" cancer; dx before 60   Throat cancer Paternal Uncle        dx after 47   Social History   Socioeconomic History   Marital status: Widowed    Spouse name: Not on file   Number of children: Not on file   Years of education: Not on file   Highest education level: Not on file  Occupational History   Occupation: Retired  Tobacco Use   Smoking status: Former    Packs/day: 0.50    Years: 5.00    Pack years: 2.50    Types: Cigarettes    Quit date: 04/01/1980    Years since quitting: 40.5   Smokeless tobacco: Never  Vaping Use   Vaping Use: Never used  Substance and Sexual Activity   Alcohol use: No    Alcohol/week: 0.0 standard drinks   Drug use: No   Sexual activity: Not on file  Other Topics Concern   Not on file  Social History Narrative   Not on file   Social Determinants of Health   Financial Resource Strain: Low Risk    Difficulty of Paying Living Expenses: Not hard at all  Food Insecurity: No Food Insecurity   Worried About Charity fundraiser in the Last Year: Never true   [place] in the Last Year: Never true  Transportation Needs: No Transportation Needs   Lack of  Transportation (Medical): No   Lack of Transportation (Non-Medical): No  Physical Activity: Insufficiently Active   Days of Exercise per Week: 2 days   Minutes of Exercise per Session: 20 min  Stress: No Stress Concern Present   Feeling of Stress : Not at all  Social Connections: Moderately Isolated   Frequency of Communication with Friends and Family: More than three times a week   Frequency of Social Gatherings with Friends and Family: Three times a week   Attends Religious Services: More than 4 times per year   Active Member of Clubs or Organizations: No   Attends Club  or Organization Meetings: Never   Marital Status: Widowed    Tobacco Counseling Counseling given: Not Answered   Clinical Intake:  Pre-visit preparation completed: Yes  Pain : No/denies pain     Nutritional Risks: None Diabetes: No  How often do you need to have someone help you when you read instructions, pamphlets, or other written materials from your doctor or pharmacy?: 1 - Never  Diabetic?No   Interpreter Needed?: No  Information entered by :: Lydia of Daily Living In your present state of health, do you have any difficulty performing the following activities: 10/05/2020 09/06/2020  Hearing? N N  Vision? N N  Difficulty concentrating or making decisions? N Y  Comment - "sometimes memory is not as sharp as it used to be"  Walking or climbing stairs? N Y  Dressing or bathing? N N  Doing errands, shopping? N N  Preparing Food and eating ? N -  Using the Toilet? N -  In the past six months, have you accidently leaked urine? Y -  Comment currently wears incontience pads -  Do you have problems with loss of bowel control? N -  Managing your Medications? N -  Managing your Finances? N -  Housekeeping or managing your Housekeeping? N -  Some recent data might be hidden    Patient Care Team: [name], MD as PCP - General (Family Medicine) Mauro Kaufmann, RN as Oncology  Nurse Navigator Rockwell Germany, RN as Oncology Nurse Navigator Stark Klein, MD as Consulting Physician (General Surgery) Nicholas Lose, MD as Consulting Physician (Hematology and Oncology) Eppie Gibson, MD as Attending Physician (Radiation Oncology)  Indicate any recent [place] you may have received from other than [facility] providers in the past year (date may be approximate).     Assessment:   This is a routine wellness examination for [place].  Hearing/Vision screen Vision Screening - Comments:: Patient states gets eye exams once per year. Currently wears glasses!   Dietary issues and exercise activities discussed: Current Exercise Habits: Home exercise routine, Type of exercise: walking, Time (Minutes): 20, Frequency (Times/Week): 2, Weekly Exercise (Minutes/Week): 40, Intensity: Mild   Goals Addressed             This Visit's Progress    Prevent falls         Depression Screen PHQ 2/9 Scores 10/05/2020 06/08/2020 05/22/2017 05/22/2017 01/02/2017 01/16/2015  PHQ - 2 Score 0 0 0 0 0 0  PHQ- 9 Score - - - - - 0    Fall Risk Fall Risk  10/05/2020 06/08/2020 04/28/2020 10/29/2018 05/22/2017  Falls in the past year? 1 1 0 0 No  Comment - - - Emmi Telephone Survey: data to providers prior to load -  Number falls in past yr: 1 0 0 - -  Injury with Fall? 0 1 0 - -  Risk for fall due to : Impaired balance/gait Impaired balance/gait No Fall Risks - -  Follow up Falls evaluation completed;Falls prevention discussed Falls evaluation completed Falls evaluation completed - -    FALL RISK PREVENTION PERTAINING TO THE HOME:  Any stairs in or around the home? No  If so, are there any without handrails? No  Home free of loose throw rugs in walkways, pet beds, electrical cords, etc? Yes  Adequate lighting in your home to reduce risk of falls? Yes   ASSISTIVE DEVICES UTILIZED TO PREVENT FALLS:  Life alert? Yes  Use of a cane, walker or  w/c? Yes  Grab bars in the bathroom? Yes   Shower chair or bench in shower? No  Elevated toilet seat or a handicapped toilet? Yes     Cognitive Function:  Normal cognitive status assessed by direct observation by this Nurse Health Advisor. No abnormalities found.        Immunizations Immunization History  Administered Date(s) Administered   Fluad Quad(high Dose 65+) 12/29/2018   Influenza, High Dose Seasonal PF 01/05/2018   Influenza,inj,Quad PF,6+ Mos 01/17/2014, 01/11/2015   Influenza-Unspecified 03/05/2011, 12/30/2011, 01/30/2013, 02/08/2016, 05/01/2017, 03/29/2020   Pneumococcal Conjugate-13 03/01/2014   Pneumococcal Polysaccharide-23 12/10/2010    TDAP status: Due, Education has been provided regarding the importance of this vaccine. Advised may receive this vaccine at local pharmacy or Health Dept. Aware to provide a copy of the vaccination record if obtained from local pharmacy or Health Dept. Verbalized acceptance and understanding.  Flu Vaccine status: Up to date  Pneumococcal vaccine status: Up to date  Covid-19 vaccine status: Declined, Education has been provided regarding the importance of this vaccine but patient still declined. Advised may receive this vaccine at local pharmacy or Health Dept.or vaccine clinic. Aware to provide a copy of the vaccination record if obtained from local pharmacy or Health Dept. Verbalized acceptance and understanding.  Qualifies for Shingles Vaccine? Yes   Zostavax completed No   Shingrix Completed?: No.    Education has been provided regarding the importance of this vaccine. Patient has been advised to call insurance company to determine out of pocket expense if they have not yet received this vaccine. Advised may also receive vaccine at local pharmacy or Health Dept. Verbalized acceptance and understanding.  Screening Tests Health Maintenance  Topic Date Due   COVID-19 Vaccine (1) Never done   Hepatitis C Screening  Never done   TETANUS/TDAP  Never done   Zoster Vaccines-  Shingrix (1 of 2) Never done   INFLUENZA VACCINE  10/30/2020   DEXA SCAN  Completed   PNA vac Low Risk Adult  Completed   HPV VACCINES  Aged Out    Health Maintenance  Health Maintenance Due  Topic Date Due   COVID-19 Vaccine (1) Never done   Hepatitis C Screening  Never done   TETANUS/TDAP  Never done   Zoster Vaccines- Shingrix (1 of 2) Never done    Colorectal cancer screening: No longer required.   Mammogram status: Completed 11/03/2019. Repeat every year  Bone Density status: Ordered 10/05/2020. Pt provided with contact info and advised to call to schedule appt.  Lung Cancer Screening: (Low Dose CT Chest recommended if Age 60-80 years, 30 pack-year currently smoking OR have quit w/in 15years.) does not qualify.   Lung Cancer Screening Referral: N/A   Additional Screening:  Hepatitis C Screening: does qualify;   Vision Screening: Recommended annual ophthalmology exams for early detection of glaucoma and other disorders of the eye. Is the patient up to date with their annual eye exam?  Yes  Who is the provider or what is the name of the office in which the patient attends annual eye exams? Dr. Buelah Manis  If pt is not established with a provider, would they like to be referred to a provider to establish care? No .   Dental Screening: Recommended annual dental exams for proper oral hygiene  Community Resource Referral / Chronic Care Management: CRR required this visit?  No   CCM required this visit?  No      Plan:     I have  personally reviewed and noted the following in the patient's chart:   Medical and social history Use of alcohol, tobacco or illicit drugs  Current medications and supplements including opioid prescriptions.  Functional ability and status Nutritional status Physical activity Advanced directives List of other physicians Hospitalizations, surgeries, and ER visits in previous 12 months Vitals Screenings to include cognitive, depression, and  falls Referrals and appointments  In addition, I have reviewed and discussed with patient certain preventive protocols, quality metrics, and best practice recommendations. A written personalized care plan for preventive services as well as general preventive health recommendations were provided to patient.     Ofilia Neas, LPN   10/30/7709   Nurse Notes: None

## 2020-10-05 NOTE — Patient Instructions (Signed)
Christina Lyons , Thank you for taking time to come for your Medicare Wellness Visit. I appreciate your ongoing commitment to your health goals. Please review the following plan we discussed and let me know if I can assist you in the future.   Screening recommendations/referrals: Colonoscopy: No longer required  Mammogram: Up to date, next due 11/02/2020 Bone Density: Currently due orders placed this visit. You may call [facility] and get scheduled  Recommended yearly ophthalmology/optometry visit for glaucoma screening and checkup Recommended yearly dental visit for hygiene and checkup  Vaccinations: Influenza vaccine: Up to date, next due fall 2022  Pneumococcal vaccine: Completed series  Tdap vaccine: Currently due, you may await and injury to receive Shingles vaccine: Currently due, you may receive at your local pharmacy     Advanced directives: Advance directive discussed with you today. Even though you declined this today please call our office should you change your mind and we can give you the proper paperwork for you to fill out.   Conditions/risks identified: None   Next appointment: None    Preventive Care 65 Years and Older, Female Preventive care refers to lifestyle choices and visits with your health care provider that can promote health and wellness. What does preventive care include? A yearly physical exam. This is also called an annual well check. Dental exams once or twice a year. Routine eye exams. Ask your health care provider how often you should have your eyes checked. Personal lifestyle choices, including: Daily care of your teeth and gums. Regular physical activity. Eating a healthy diet. Avoiding tobacco and drug use. Limiting alcohol use. Practicing safe sex. Taking low-dose aspirin every day. Taking vitamin and mineral supplements as recommended by your health care provider. What happens during an annual well check? The services and screenings done  by your health care provider during your annual well check will depend on your age, overall health, lifestyle risk factors, and family history of disease. Counseling  Your health care provider may ask you questions about your: Alcohol use. Tobacco use. Drug use. Emotional well-being. Home and relationship well-being. Sexual activity. Eating habits. History of falls. Memory and ability to understand (cognition). Work and work Statistician. Reproductive health. Screening  You may have the following tests or measurements: Height, weight, and BMI. Blood pressure. Lipid and cholesterol levels. These may be checked every 5 years, or more frequently if you are over 52 years old. Skin check. Lung cancer screening. You may have this screening every year starting at age 91 if you have a 30-pack-year history of smoking and currently smoke or have quit within the past 15 years. Fecal occult blood test (FOBT) of the stool. You may have this test every year starting at age 36. Flexible sigmoidoscopy or colonoscopy. You may have a sigmoidoscopy every 5 years or a colonoscopy every 10 years starting at age 73. Hepatitis C blood test. Hepatitis B blood test. Sexually transmitted disease (STD) testing. Diabetes screening. This is done by checking your blood sugar (glucose) after you have not eaten for a while (fasting). You may have this done every 1-3 years. Bone density scan. This is done to screen for osteoporosis. You may have this done starting at age 18. Mammogram. This may be done every 1-2 years. Talk to your health care provider about how often you should have regular mammograms. Talk with your health care provider about your test results, treatment options, and if necessary, the need for more tests. Vaccines  Your health care provider may  recommend certain vaccines, such as: Influenza vaccine. This is recommended every year. Tetanus, diphtheria, and acellular pertussis (Tdap, Td) vaccine. You  may need a Td booster every 10 years. Zoster vaccine. You may need this after age 72. Pneumococcal 13-valent conjugate (PCV13) vaccine. One dose is recommended after age 36. Pneumococcal polysaccharide (PPSV23) vaccine. One dose is recommended after age 38. Talk to your health care provider about which screenings and vaccines you need and how often you need them. This information is not intended to replace advice given to you by your health care provider. Make sure you discuss any questions you have with your health care provider. Document Released: 04/14/2015 Document Revised: 12/06/2015 Document Reviewed: 01/17/2015 Elsevier Interactive Patient Education  2017 [place] Prevention in the Home Falls can cause injuries. They can happen to people of all ages. There are many things you can do to make your home safe and to help prevent falls. What can I do on the outside of my home? Regularly fix the edges of walkways and driveways and fix any cracks. Remove anything that might make you trip as you walk through a door, such as a raised step or threshold. Trim any bushes or trees on the path to your home. Use bright outdoor lighting. Clear any walking paths of anything that might make someone trip, such as rocks or tools. Regularly check to see if handrails are loose or broken. Make sure that both sides of any steps have handrails. Any raised decks and porches should have guardrails on the edges. Have any leaves, snow, or ice cleared regularly. Use sand or salt on walking paths during winter. Clean up any spills in your garage right away. This includes oil or grease spills. What can I do in the bathroom? Use night lights. Install grab bars by the toilet and in the tub and shower. Do not use towel bars as grab bars. Use non-skid mats or decals in the tub or shower. If you need to sit down in the shower, use a plastic, non-slip stool. Keep the floor dry. Clean up any water that spills  on the floor as soon as it happens. Remove soap buildup in the tub or shower regularly. Attach bath mats securely with double-sided non-slip rug tape. Do not have throw rugs and other things on the floor that can make you trip. What can I do in the bedroom? Use night lights. Make sure that you have a light by your bed that is easy to reach. Do not use any sheets or blankets that are too big for your bed. They should not hang down onto the floor. Have a firm chair that has side arms. You can use this for support while you get dressed. Do not have throw rugs and other things on the floor that can make you trip. What can I do in the kitchen? Clean up any spills right away. Avoid walking on wet floors. Keep items that you use a lot in easy-to-reach places. If you need to reach something above you, use a strong step stool that has a grab bar. Keep electrical cords out of the way. Do not use floor polish or wax that makes floors slippery. If you must use wax, use non-skid floor wax. Do not have throw rugs and other things on the floor that can make you trip. What can I do with my stairs? Do not leave any items on the stairs. Make sure that there are handrails on both sides of  the stairs and use them. Fix handrails that are broken or loose. Make sure that handrails are as long as the stairways. Check any carpeting to make sure that it is firmly attached to the stairs. Fix any carpet that is loose or worn. Avoid having throw rugs at the top or bottom of the stairs. If you do have throw rugs, attach them to the floor with carpet tape. Make sure that you have a light switch at the top of the stairs and the bottom of the stairs. If you do not have them, ask someone to add them for you. What else can I do to help prevent falls? Wear shoes that: Do not have high heels. Have rubber bottoms. Are comfortable and fit you well. Are closed at the toe. Do not wear sandals. If you use a stepladder: Make  sure that it is fully opened. Do not climb a closed stepladder. Make sure that both sides of the stepladder are locked into place. Ask someone to hold it for you, if possible. Clearly mark and make sure that you can see: Any grab bars or handrails. First and last steps. Where the edge of each step is. Use tools that help you move around (mobility aids) if they are needed. These include: Canes. Walkers. Scooters. Crutches. Turn on the lights when you go into a dark area. Replace any light bulbs as soon as they burn out. Set up your furniture so you have a clear path. Avoid moving your furniture around. If any of your floors are uneven, fix them. If there are any pets around you, be aware of where they are. Review your medicines with your doctor. Some medicines can make you feel dizzy. This can increase your chance of falling. Ask your doctor what other things that you can do to help prevent falls. This information is not intended to replace advice given to you by your health care provider. Make sure you discuss any questions you have with your health care provider. Document Released: 01/12/2009 Document Revised: 08/24/2015 Document Reviewed: 04/22/2014 Elsevier Interactive Patient Education  2017 [employer].

## 2020-10-06 ENCOUNTER — Ambulatory Visit: Payer: PPO | Admitting: Radiation Oncology

## 2020-10-06 ENCOUNTER — Ambulatory Visit: Payer: PPO

## 2020-10-12 ENCOUNTER — Encounter: Payer: Self-pay | Admitting: *Deleted

## 2020-10-15 ENCOUNTER — Other Ambulatory Visit: Payer: Self-pay | Admitting: Family Medicine

## 2020-10-30 ENCOUNTER — Other Ambulatory Visit: Payer: Self-pay | Admitting: Family Medicine

## 2020-10-30 MED ORDER — LORAZEPAM 1 MG PO TABS: 1.0000 mg | ORAL_TABLET | Freq: Two times a day (BID) | ORAL | 0 refills | Status: DC | PRN

## 2020-10-30 NOTE — Telephone Encounter (Signed)
Patient called to request refill of LORazepam (ATIVAN) 1 MG tablet [id number]   Pharmacy confirmed as  [id number] [address] [id number] Phone:  [phone] Fax:  [phone] DEA #:  [id number] DAW Reason: --    Patient has 5-6 pills left.   Please advise at [phone]

## 2020-10-30 NOTE — Telephone Encounter (Signed)
Ok to refill??  Last office visit 06/08/2020  Last refill 04/23/2018.

## 2020-11-19 ENCOUNTER — Other Ambulatory Visit: Payer: Self-pay | Admitting: Family Medicine

## 2020-12-08 ENCOUNTER — Other Ambulatory Visit: Payer: Self-pay | Admitting: Family Medicine

## 2020-12-08 NOTE — Telephone Encounter (Signed)
Ok to refill??  Last office visit 06/08/2020.  Last refill 10/30/2020.

## 2020-12-14 ENCOUNTER — Other Ambulatory Visit: Payer: Self-pay | Admitting: Family Medicine

## 2020-12-14 MED ORDER — HYDROCODONE-ACETAMINOPHEN 5-325 MG PO TABS: 1.0000 | ORAL_TABLET | Freq: Four times a day (QID) | ORAL | 0 refills | Status: DC | PRN

## 2020-12-14 NOTE — Telephone Encounter (Signed)
Patient called to request refill of HYDROcodone-acetaminophen (NORCO/VICODIN) 5-325 MG tablet [id number]   Pharmacy verified as   [id number] [place] [address] [id number] Phone:  [phone] Fax:  [phone] DEA #:  [id number] Patient has about 2 pills left.  Please advise at [phone].

## 2020-12-14 NOTE — Telephone Encounter (Signed)
Ok to refill??  Last office visit 06/08/2020.  Last refill 09/12/2020 from [facility]. Barry Dienes, MD, [facility].  Of note, letter sent to patient to schedule F/U on 07/03/2020.

## 2020-12-20 DIAGNOSIS — Z79899 Other long term (current) drug therapy: Secondary | ICD-10-CM | POA: Diagnosis not present

## 2020-12-20 DIAGNOSIS — Z113 Encounter for screening for infections with a predominantly sexual mode of transmission: Secondary | ICD-10-CM | POA: Diagnosis not present

## 2020-12-20 DIAGNOSIS — M129 Arthropathy, unspecified: Secondary | ICD-10-CM | POA: Diagnosis not present

## 2020-12-20 DIAGNOSIS — Z131 Encounter for screening for diabetes mellitus: Secondary | ICD-10-CM | POA: Diagnosis not present

## 2020-12-20 DIAGNOSIS — I10 Essential (primary) hypertension: Secondary | ICD-10-CM | POA: Diagnosis not present

## 2020-12-20 DIAGNOSIS — F32A Depression, unspecified: Secondary | ICD-10-CM | POA: Diagnosis not present

## 2020-12-20 DIAGNOSIS — R5383 Other fatigue: Secondary | ICD-10-CM | POA: Diagnosis not present

## 2020-12-20 DIAGNOSIS — E7800 Pure hypercholesterolemia, unspecified: Secondary | ICD-10-CM | POA: Diagnosis not present

## 2020-12-20 DIAGNOSIS — F419 Anxiety disorder, unspecified: Secondary | ICD-10-CM | POA: Diagnosis not present

## 2020-12-20 DIAGNOSIS — J40 Bronchitis, not specified as acute or chronic: Secondary | ICD-10-CM | POA: Diagnosis not present

## 2020-12-20 DIAGNOSIS — Z1159 Encounter for screening for other viral diseases: Secondary | ICD-10-CM | POA: Diagnosis not present

## 2020-12-20 DIAGNOSIS — R0602 Shortness of breath: Secondary | ICD-10-CM | POA: Diagnosis not present

## 2020-12-20 DIAGNOSIS — D539 Nutritional anemia, unspecified: Secondary | ICD-10-CM | POA: Diagnosis not present

## 2020-12-20 DIAGNOSIS — E559 Vitamin D deficiency, unspecified: Secondary | ICD-10-CM | POA: Diagnosis not present

## 2020-12-20 DIAGNOSIS — G629 Polyneuropathy, unspecified: Secondary | ICD-10-CM | POA: Diagnosis not present

## 2020-12-20 DIAGNOSIS — M5030 Other cervical disc degeneration, unspecified cervical region: Secondary | ICD-10-CM | POA: Diagnosis not present

## 2020-12-20 DIAGNOSIS — Z0000 Encounter for general adult medical examination without abnormal findings: Secondary | ICD-10-CM | POA: Diagnosis not present

## 2020-12-20 DIAGNOSIS — Z1339 Encounter for screening examination for other mental health and behavioral disorders: Secondary | ICD-10-CM | POA: Diagnosis not present

## 2020-12-21 ENCOUNTER — Encounter: Payer: PPO | Admitting: Hematology and Oncology

## 2020-12-25 DIAGNOSIS — Z79899 Other long term (current) drug therapy: Secondary | ICD-10-CM | POA: Diagnosis not present

## 2020-12-28 ENCOUNTER — Telehealth: Payer: Self-pay | Admitting: *Deleted

## 2020-12-28 ENCOUNTER — Encounter: Payer: PPO | Admitting: Hematology and Oncology

## 2021-01-08 ENCOUNTER — Inpatient Hospital Stay: Payer: PPO | Attending: Hematology and Oncology | Admitting: Adult Health

## 2021-01-08 ENCOUNTER — Other Ambulatory Visit: Payer: Self-pay | Admitting: *Deleted

## 2021-01-08 ENCOUNTER — Encounter: Payer: Self-pay | Admitting: Adult Health

## 2021-01-08 ENCOUNTER — Other Ambulatory Visit: Payer: Self-pay

## 2021-01-08 VITALS — BP 162/75 | HR 92 | Temp 98.6°F | Resp 16 | Ht 60.0 in | Wt 207.0 lb

## 2021-01-08 DIAGNOSIS — Z90710 Acquired absence of both cervix and uterus: Secondary | ICD-10-CM | POA: Diagnosis not present

## 2021-01-08 DIAGNOSIS — I10 Essential (primary) hypertension: Secondary | ICD-10-CM | POA: Insufficient documentation

## 2021-01-08 DIAGNOSIS — Z79899 Other long term (current) drug therapy: Secondary | ICD-10-CM | POA: Insufficient documentation

## 2021-01-08 DIAGNOSIS — J018 Other acute sinusitis: Secondary | ICD-10-CM | POA: Diagnosis not present

## 2021-01-08 DIAGNOSIS — Z170 Estrogen receptor positive status [ER+]: Secondary | ICD-10-CM | POA: Diagnosis not present

## 2021-01-08 DIAGNOSIS — C50411 Malignant neoplasm of upper-outer quadrant of right female breast: Secondary | ICD-10-CM | POA: Insufficient documentation

## 2021-01-08 DIAGNOSIS — Z7982 Long term (current) use of aspirin: Secondary | ICD-10-CM | POA: Diagnosis not present

## 2021-01-08 DIAGNOSIS — Z79811 Long term (current) use of aromatase inhibitors: Secondary | ICD-10-CM | POA: Diagnosis not present

## 2021-01-08 DIAGNOSIS — E2839 Other primary ovarian failure: Secondary | ICD-10-CM

## 2021-01-08 DIAGNOSIS — R42 Dizziness and giddiness: Secondary | ICD-10-CM | POA: Diagnosis not present

## 2021-01-08 DIAGNOSIS — Z6841 Body Mass Index (BMI) 40.0 and over, adult: Secondary | ICD-10-CM | POA: Diagnosis not present

## 2021-01-08 NOTE — Progress Notes (Signed)
SURVIVORSHIP VISIT:  BRIEF ONCOLOGIC HISTORY:  Oncology History  Malignant neoplasm of upper-outer quadrant of right breast in female, estrogen receptor positive ([place])  08/10/2020 Initial Diagnosis   Palpable right breast lump 12 o'clock position near areola: 0.5 cm by mammogram and 0.7 cm by ultrasound: Biopsy grade 2 IDC ER 95% PR 95%, Ki-67 10%, HER2 negative   08/16/2020 Cancer Staging   Staging form: Breast, AJCC 8th Edition - Clinical stage from 08/16/2020: Stage IA (cT1b, cN0, cM0, G2, ER+, PR+, HER2-) - Signed by Nicholas Lose, MD on 08/16/2020 Histologic grading system: 3 grade system   08/30/2020 Genetic Testing   Negative hereditary cancer genetic testing: no pathogenic variants detected in [place] +RNAinsight Panel.  The report date is August 30, 2020.   The [facility]RNAinsight panel offered by [facility] includes sequencing and rearrangement analysis for the following 47 genes:  APC, ATM, AXIN2, BARD1, BMPR1A, BRCA1, BRCA2, BRIP1, CDH1, CDK4, CDKN2A, CHEK2, DICER1, EPCAM, GREM1, HOXB13, MEN1, MLH1, MSH2, MSH3, MSH6, MUTYH, NBN, NF1, NF2, NTHL1, PALB2, PMS2, POLD1, POLE, PTEN, RAD51C, RAD51D, RECQL, RET, SDHA, SDHAF2, SDHB, SDHC, SDHD, SMAD4, SMARCA4, STK11, TP53, TSC1, TSC2, and VHL.  RNA data is routinely analyzed for use in variant interpretation for all genes.     INTERVAL HISTORY:  Ms. Billet to review her survivorship care plan detailing her treatment course for breast cancer, as well as monitoring long-term side effects of that treatment, education regarding health maintenance, screening, and overall wellness and health promotion.     Overall, Ms. Langhorne reports feeling quite well.  She is taking letrozole daily and denies hot flashes, vaginal dryness or arthralgias.  She overall feels well.    REVIEW OF SYSTEMS:  Review of Systems  Constitutional:  Negative for appetite change, chills, fatigue, fever and unexpected weight change.  HENT:   Negative  for hearing loss, lump/mass and trouble swallowing.   Eyes:  Negative for eye problems and icterus.  Respiratory:  Negative for chest tightness, cough and shortness of breath.   Cardiovascular:  Negative for chest pain, leg swelling and palpitations.  Gastrointestinal:  Negative for abdominal distention, abdominal pain, constipation, diarrhea, nausea and vomiting.  Endocrine: Negative for hot flashes.  Genitourinary:  Negative for difficulty urinating.   Musculoskeletal:  Negative for arthralgias.  Skin:  Negative for itching and rash.  Neurological:  Negative for dizziness, extremity weakness, headaches and numbness.  Hematological:  Negative for adenopathy. Does not bruise/bleed easily.  Psychiatric/Behavioral:  Negative for depression. The patient is not nervous/anxious.   Breast: Denies any new nodularity, masses, tenderness, nipple changes, or nipple discharge.   ONCOLOGY TREATMENT TEAM:  1. Surgeon:  Dr. Barry Dienes at [facility] 2. Medical Oncologist: Dr. Lindi Adie  3. Radiation Oncologist: Dr. Isidore Moos    PAST MEDICAL/SURGICAL HISTORY:  Past Medical History:  Diagnosis Date   DDD (degenerative disc disease), lumbar    Family history of breast cancer 08/16/2020   GERD (gastroesophageal reflux disease)    History of bronchitis    Hyperlipidemia    Hypertension    Osteomyelitis ([place]) 1946   both legs   Osteoporosis    Other idiopathic scoliosis, thoracolumbar region    Past Surgical History:  Procedure Laterality Date   ABDOMINAL HYSTERECTOMY     BREAST LUMPECTOMY Right 09/12/2020   Procedure: RIGHT BREAST LUMPECTOMY;  Surgeon: Stark Klein, MD;  Location: [facility];  Service: General;  Laterality: Right;   CHOLECYSTECTOMY     JOINT REPLACEMENT     right knee  ALLERGIES:  Allergies  Allergen Reactions   Sulfa Antibiotics Hives   Tape Itching    Paper tape     CURRENT MEDICATIONS:  Outpatient Encounter Medications as of 01/08/2021  Medication Sig    albuterol (VENTOLIN HFA) 108 (90 Base) MCG/ACT inhaler Inhale 1 puff into the lungs every 6 (six) hours as needed for wheezing or shortness of breath.   aspirin EC 81 MG tablet Take 81 mg by mouth at bedtime.    cetirizine (ZYRTEC) 10 MG tablet Take 10 mg by mouth at bedtime.   Cholecalciferol (VITAMIN D) 50 MCG (2000 UT) CAPS Take 4,000 Units by mouth daily.   fluticasone (FLONASE) 50 MCG/ACT nasal spray Place 1 spray into both nostrils every 12 (twelve) hours as needed for allergies.    gabapentin (NEURONTIN) 300 MG capsule Take 300 mg by mouth 2 (two) times daily.   HYDROcodone-acetaminophen (NORCO/VICODIN) 5-325 MG tablet Take 1 tablet by mouth every 6 (six) hours as needed. Chronic Pain. Dx: G89.4   letrozole (FEMARA) 2.5 MG tablet Take 1 tablet (2.5 mg total) by mouth daily.   LORazepam (ATIVAN) 1 MG tablet TAKE 1 TABLET BY MOUTH 2 TIMES DAILY AS NEEDED FOR ANXIETY.   losartan (COZAAR) 50 MG tablet TAKE 1 TABLET BY MOUTH EVERY DAY   meloxicam (MOBIC) 15 MG tablet TAKE 1 TABLET BY MOUTH EVERY DAY   montelukast (SINGULAIR) 10 MG tablet Take 10 mg by mouth at bedtime.   Multiple Vitamin (MULTIVITAMIN WITH MINERALS) TABS tablet Take 1 tablet by mouth daily with supper.   omeprazole (PRILOSEC) 20 MG capsule TAKE 1 CAPSULE BY MOUTH TWICE A DAY (Patient taking differently: Take 20 mg by mouth 2 (two) times daily before a meal.)   rosuvastatin (CRESTOR) 10 MG tablet Take 1 tablet (10 mg total) by mouth daily.   sennosides-docusate sodium (SENOKOT-S) 8.6-50 MG tablet Take 1 tablet by mouth daily as needed for constipation.   vitamin C (ASCORBIC ACID) 500 MG tablet Take 500 mg by mouth daily. (Patient not taking: Reported on 10/05/2020)   zinc gluconate 50 MG tablet Take 50 mg by mouth daily.   Facility-Administered Encounter Medications as of 01/08/2021  Medication   denosumab (PROLIA) injection 60 mg     ONCOLOGIC FAMILY HISTORY:  Family History  Problem Relation Age of Onset   Asthma Mother     Asthma Sister    Heart failure Father    Breast cancer Sister        contralateral; dx 11; dx 79   Breast cancer Maternal Aunt 37   Cancer Paternal Aunt        unknown "female" cancer; dx before 102   Throat cancer Paternal Uncle        dx after 28     GENETIC COUNSELING/TESTING: See above results  SOCIAL HISTORY:  Social History   Socioeconomic History   Marital status: Widowed    Spouse name: Not on file   Number of children: Not on file   Years of education: Not on file   Highest education level: Not on file  Occupational History   Occupation: Retired  Tobacco Use   Smoking status: Former    Packs/day: 0.50    Years: 5.00    Pack years: 2.50    Types: Cigarettes    Quit date: 04/01/1980    Years since quitting: 40.8   Smokeless tobacco: Never  Vaping Use   Vaping Use: Never used  Substance and Sexual Activity   Alcohol use: No  Alcohol/week: 0.0 standard drinks   Drug use: No   Sexual activity: Not on file  Other Topics Concern   Not on file  Social History Narrative   Not on file   Social Determinants of Health   Financial Resource Strain: Low Risk    Difficulty of Paying Living Expenses: Not hard at all  Food Insecurity: No Food Insecurity   Worried About Charity fundraiser in the Last Year: Never true   [place] in the Last Year: Never true  Transportation Needs: No Transportation Needs   Lack of Transportation (Medical): No   Lack of Transportation (Non-Medical): No  Physical Activity: Insufficiently Active   Days of Exercise per Week: 2 days   Minutes of Exercise per Session: 20 min  Stress: No Stress Concern Present   Feeling of Stress : Not at all  Social Connections: Moderately Isolated   Frequency of Communication with Friends and Family: More than three times a week   Frequency of Social Gatherings with Friends and Family: Three times a week   Attends Religious Services: More than 4 times per year   Active Member of Clubs or  Organizations: No   Attends Archivist Meetings: Never   Marital Status: Widowed  Human resources officer Violence: Not At Risk   Fear of Current or Ex-Partner: No   Emotionally Abused: No   Physically Abused: No   Sexually Abused: No     OBSERVATIONS/OBJECTIVE:  BP (!) 162/75 (BP Location: Right Arm, Patient Position: Sitting)   Pulse 92   Temp 98.6 F (37 C) (Tympanic)   Resp 16   Ht 5' (1.524 m)   Wt 207 lb (93.9 kg)   SpO2 99%   BMI 40.43 kg/m  GENERAL: Patient is a well appearing female in no acute distress HEENT:  Sclerae anicteric.  Oropharynx clear and moist. No ulcerations or evidence of oropharyngeal candidiasis. Neck is supple.  NODES:  No cervical, supraclavicular, or axillary lymphadenopathy palpated.  BREAST EXAM:  right breast s/p lumpectomy, no sign of local recurrence, left breast benign LUNGS:  Clear to auscultation bilaterally.  No wheezes or rhonchi. HEART:  Regular rate and rhythm. No murmur appreciated. ABDOMEN:  Soft, nontender.  Positive, normoactive bowel sounds. No organomegaly palpated. MSK:  No focal spinal tenderness to palpation. Full range of motion bilaterally in the upper extremities. EXTREMITIES:  No peripheral edema.   SKIN:  Clear with no obvious rashes or skin changes. No nail dyscrasia. NEURO:  Nonfocal. Well oriented.  Appropriate affect.   LABORATORY DATA:  None for this visit.  DIAGNOSTIC IMAGING:  None for this visit.      ASSESSMENT AND PLAN:  Ms.. Lantigua is a pleasant 80 y.o. female with Stage IA right breast invasive ductal carcinoma, ER+/PR+/HER2-, diagnosed in 07/2020, treated with lumpectomy, and anti-estrogen therapy with Letrozole beginning in 09/2020.  She presents to the Survivorship Clinic for our initial meeting and routine follow-up post-completion of treatment for breast cancer.    1. Stage IA right breast cancer:  Ms. Ahmed is continuing to recover from definitive treatment for breast cancer. She will follow-up  with her medical oncologist, Dr. Lindi Adie in 6 months with history and physical exam per surveillance protocol.  She will continue her anti-estrogen therapy with Letrozole. Thus far, she is tolerating the Letrozole  well, with minimal side effects. She was instructed to make Dr. Lindi Adie or myself aware if she begins to experience any worsening side effects of the medication and  I could see her back in clinic to help manage those side effects, as needed. Her mammogram is due 07/2021; orders placed today. Today, a comprehensive survivorship care plan and treatment summary was reviewed with the patient today detailing her breast cancer diagnosis, treatment course, potential late/long-term effects of treatment, appropriate follow-up care with recommendations for the future, and patient education resources.  A copy of this summary, along with a letter will be sent to the patient's primary care provider via mail/fax/In Basket message after today's visit.    2. Bone health:  Given Ms. Yon's age/history of breast cancer and her current treatment regimen including anti-estrogen therapy with Letrozole, she is at risk for bone demineralization.  She is due for DEXA, and I placed orders for this today.  She was given education on specific activities to promote bone health.  3. Cancer screening:  Due to Ms. Kamau's history and her age, she should receive screening for skin cancers, colon cancer.  The information and recommendations are listed on the patient's comprehensive care plan/treatment summary and were reviewed in detail with the patient.    4. Health maintenance and wellness promotion: Ms. Engelken was encouraged to consume 5-7 servings of fruits and vegetables per day. We reviewed the "Nutrition Rainbow" handout, as well as the handout "Take Control of Your Health and Reduce Your Cancer Risk" from the [facility].  She was also encouraged to engage in moderate to vigorous exercise for 30 minutes per day  most days of the week. We discussed the [employer] fitness program, which is designed for cancer survivors to help them become more physically fit after cancer treatments.  She was instructed to limit her alcohol consumption and continue to abstain from tobacco use.     5. Support services/counseling: It is not uncommon for this period of the patient's cancer care trajectory to be one of many emotions and stressors.  We discussed how this can be increasingly difficult during the times of quarantine and social distancing due to the COVID-19 pandemic.   She was given information regarding our available services and encouraged to contact me with any questions or for help enrolling in any of our support group/programs.    Follow up instructions:    -Return to cancer center in 6 months for f/u with Dr. Lindi Adie  -Mammogram due in 07/2021 -Bone density due this month -Follow up with Dr. Barry Dienes in 1 year. -She is welcome to return back to the Survivorship Clinic at any time; no additional follow-up needed at this time.  -Consider referral back to survivorship as a long-term survivor for continued surveillance  The patient was provided an opportunity to ask questions and all were answered. The patient agreed with the plan and demonstrated an understanding of the instructions.   Total encounter time: 41 minutes in chart review, lab review, order entry,face to face visit time, and documentation of the encounter.    Wilber Bihari, NP 01/09/21 11:59 AM Medical Oncology and Hematology [facility] [place], [place] [address] Tel. [phone]    Fax. [phone]  *Total Encounter Time as defined by the Centers for Medicare and Medicaid Services includes, in addition to the face-to-face time of a patient visit (documented in the note above) non-face-to-face time: obtaining and reviewing outside history, ordering and reviewing medications, tests or procedures, care  coordination (communications with other health care professionals or caregivers) and documentation in the medical record.

## 2021-01-09 ENCOUNTER — Encounter: Payer: Self-pay | Admitting: Adult Health

## 2021-03-12 ENCOUNTER — Other Ambulatory Visit: Payer: Self-pay

## 2021-03-12 ENCOUNTER — Encounter: Payer: Self-pay | Admitting: Family Medicine

## 2021-03-12 ENCOUNTER — Ambulatory Visit (INDEPENDENT_AMBULATORY_CARE_PROVIDER_SITE_OTHER): Payer: PPO | Admitting: Family Medicine

## 2021-03-12 VITALS — BP 128/82 | HR 82 | Resp 18 | Wt 217.0 lb

## 2021-03-12 DIAGNOSIS — G8929 Other chronic pain: Secondary | ICD-10-CM | POA: Diagnosis not present

## 2021-03-12 DIAGNOSIS — M25562 Pain in left knee: Secondary | ICD-10-CM | POA: Diagnosis not present

## 2021-03-12 NOTE — Progress Notes (Signed)
Subjective:    Patient ID: Christina Lyons, female    DOB: Dec 14, 1940, 80 y.o.   MRN: [id number]  Knee Pain  06/08/20 Patient presents today complaining of pain in her left knee.  Patient has a history of osteoarthritis in the left knee.  We have given her cortisone injections in the left knee before.  She was doing well with meloxicam after the cortisone injection until 2 weeks ago.  She lost her balance and fell on her wooden deck.  She fell from a standing position down to her left knee striking the lower portion of the patella against the wooden deck.  She developed a hematoma just inferior and lateral to the patella immediately at thereafter.  The skin in that area is slightly yellow and discolored with some small bruising.  However there is bruising tracking down the anterior left shin to the mid shin that I believe is blood from the original hematoma that formed just below the patella.  She has crepitus on range of motion in the left knee but no pain with range of motion.  She is tender to palpation over the hematoma along the shin and just below the patella however there is no palpable deformity or fracture.  She denies any instability in the knee joint or laxity to varus or valgus stress.  At that time, my plan was:  The knee joint is stable without any evidence of laxity.  There is no effusion although there is some bruising around the patella.  There is some mild tenderness with range of motion however the patient is able to bear weight without any difficulty.  I believe that she has underlying osteoarthritis of the left knee and now a traumatic hematoma.  I recommended stopping the meloxicam temporarily and using prednisone taper pack.  She is apply ice for 5 to 10 minutes 2-3 times a day to help control the swelling and stay off the knee is much as possible over the weekend.  Reassess in 1 week if no better or sooner if worse.  I do not feel that there is any fracture  03/12/21 Patient  continues to report pain in her left knee.  She states that she did well ever since March however in November she started developing pain and swelling in the left knee.  Today there is a moderate effusion.  She has tenderness to palpation of the medial and lateral joint line.  She has pain with ambulation.  She has pain with standing.  She is tried several knee braces with no help.  She denies any instability.  She denies any falls.  The knee is not locking.  She denies the knee giving out on her. Past Medical History:  Diagnosis Date   DDD (degenerative disc disease), lumbar    Family history of breast cancer 08/16/2020   GERD (gastroesophageal reflux disease)    History of bronchitis    Hyperlipidemia    Hypertension    Osteomyelitis ([place]) 1946   both legs   Osteoporosis    Other idiopathic scoliosis, thoracolumbar region    Past Surgical History:  Procedure Laterality Date   ABDOMINAL HYSTERECTOMY     BREAST LUMPECTOMY Right 09/12/2020   Procedure: RIGHT BREAST LUMPECTOMY;  Surgeon: Stark Klein, MD;  Location: [facility];  Service: General;  Laterality: Right;   CHOLECYSTECTOMY     JOINT REPLACEMENT     right knee   Current Outpatient Medications on File Prior to Visit  Medication Sig Dispense  Refill   albuterol (VENTOLIN HFA) 108 (90 Base) MCG/ACT inhaler Inhale 1 puff into the lungs every 6 (six) hours as needed for wheezing or shortness of breath.     aspirin EC 81 MG tablet Take 81 mg by mouth at bedtime.      cetirizine (ZYRTEC) 10 MG tablet Take 10 mg by mouth at bedtime.     Cholecalciferol (VITAMIN D) 50 MCG (2000 UT) CAPS Take 4,000 Units by mouth daily.     fluticasone (FLONASE) 50 MCG/ACT nasal spray Place 1 spray into both nostrils every 12 (twelve) hours as needed for allergies.  (Patient not taking: Reported on 01/08/2021)     gabapentin (NEURONTIN) 300 MG capsule Take 300 mg by mouth 2 (two) times daily.     HYDROcodone-acetaminophen (NORCO/VICODIN) 5-325 MG tablet Take 1  tablet by mouth every 6 (six) hours as needed. Chronic Pain. Dx: G89.4 (Patient not taking: Reported on 01/08/2021) 60 tablet 0   letrozole (FEMARA) 2.5 MG tablet Take 1 tablet (2.5 mg total) by mouth daily. 90 tablet 3   LORazepam (ATIVAN) 1 MG tablet TAKE 1 TABLET BY MOUTH 2 TIMES DAILY AS NEEDED FOR ANXIETY. 60 tablet 0   losartan (COZAAR) 50 MG tablet TAKE 1 TABLET BY MOUTH EVERY DAY 90 tablet 3   meloxicam (MOBIC) 15 MG tablet TAKE 1 TABLET BY MOUTH EVERY DAY 30 tablet 4   montelukast (SINGULAIR) 10 MG tablet Take 10 mg by mouth at bedtime.     Multiple Vitamin (MULTIVITAMIN WITH MINERALS) TABS tablet Take 1 tablet by mouth daily with supper.     MYRBETRIQ 25 MG TB24 tablet Take 25 mg by mouth daily.     omeprazole (PRILOSEC) 20 MG capsule TAKE 1 CAPSULE BY MOUTH TWICE A DAY (Patient taking differently: Take 20 mg by mouth 2 (two) times daily before a meal.) 180 capsule 3   rosuvastatin (CRESTOR) 10 MG tablet Take 1 tablet (10 mg total) by mouth daily. 90 tablet 3   sennosides-docusate sodium (SENOKOT-S) 8.6-50 MG tablet Take 1 tablet by mouth daily as needed for constipation.     temazepam (RESTORIL) 15 MG capsule Take 15 mg by mouth at bedtime.     vitamin C (ASCORBIC ACID) 500 MG tablet Take 500 mg by mouth daily.     zinc gluconate 50 MG tablet Take 50 mg by mouth daily.     Current Facility-Administered Medications on File Prior to Visit  Medication Dose Route Frequency Provider Last Rate Last Admin   denosumab (PROLIA) injection 60 mg  60 mg Subcutaneous Q6 months [name], MD   60 mg at 12/29/18 1442   Allergies  Allergen Reactions   Sulfa Antibiotics Hives   Tape Itching    Paper tape   Social History   Socioeconomic History   Marital status: Widowed    Spouse name: Not on file   Number of children: Not on file   Years of education: Not on file   Highest education level: Not on file  Occupational History   Occupation: Retired  Tobacco Use   Smoking status:  Former    Packs/day: 0.50    Years: 5.00    Pack years: 2.50    Types: Cigarettes    Quit date: 04/01/1980    Years since quitting: 40.9   Smokeless tobacco: Never  Vaping Use   Vaping Use: Never used  Substance and Sexual Activity   Alcohol use: No    Alcohol/week: 0.0 standard drinks  Drug use: No   Sexual activity: Not on file  Other Topics Concern   Not on file  Social History Narrative   Not on file   Social Determinants of Health   Financial Resource Strain: Low Risk    Difficulty of Paying Living Expenses: Not hard at all  Food Insecurity: No Food Insecurity   Worried About Running Out of Food in the Last Year: Never true   [place] in the Last Year: Never true  Transportation Needs: No Transportation Needs   Lack of Transportation (Medical): No   Lack of Transportation (Non-Medical): No  Physical Activity: Inactive   Days of Exercise per Week: 0 days   Minutes of Exercise per Session: 0 min  Stress: No Stress Concern Present   Feeling of Stress : Not at all  Social Connections: Moderately Isolated   Frequency of Communication with Friends and Family: More than three times a week   Frequency of Social Gatherings with Friends and Family: Three times a week   Attends Religious Services: More than 4 times per year   Active Member of Clubs or Organizations: No   Attends Archivist Meetings: Never   Marital Status: Widowed  Human resources officer Violence: Not At Risk   Fear of Current or Ex-Partner: No   Emotionally Abused: No   Physically Abused: No   Sexually Abused: No      Review of Systems  All other systems reviewed and are negative.     Objective:   Physical Exam Vitals reviewed.  Constitutional:      Appearance: Normal appearance. She is normal weight.  Cardiovascular:     Rate and Rhythm: Normal rate and regular rhythm.  Pulmonary:     Effort: Pulmonary effort is normal.     Breath sounds: Normal breath sounds.  Musculoskeletal:      Left knee: Deformity and effusion present. Decreased range of motion. Tenderness present over the medial joint line and lateral joint line.  Neurological:     Mental Status: She is alert.          Assessment & Plan:  Chronic pain of left knee I believe this is osteoarthritis.  Using sterile technique, I injected the left knee with 2 cc lidocaine, 2 cc of Marcaine, and 2 cc of 40 mg/mL Kenalog.  Patient tolerated the procedure well without complication.  If the pain does not improve, I would proceed with an x-ray of the knee to confirm arthritis and if severe arthritis is seen, I will consult orthopedics.

## 2021-03-19 ENCOUNTER — Other Ambulatory Visit: Payer: Self-pay | Admitting: Family Medicine

## 2021-03-19 MED ORDER — HYDROCODONE-ACETAMINOPHEN 5-325 MG PO TABS: 1.0000 | ORAL_TABLET | Freq: Four times a day (QID) | ORAL | 0 refills | Status: DC | PRN

## 2021-03-19 NOTE — Telephone Encounter (Signed)
LOV 03/12/21 Last refill 12/14/20, #60, 0 refills  Please review, thanks!

## 2021-03-19 NOTE — Telephone Encounter (Signed)
Patient called to request refill of HYDROcodone-acetaminophen (NORCO/VICODIN) 5-325 MG tablet [[id number]]  Pharmacy confirmed as  [id number] [place], [place]  [address] [place] [address]  Phone:  [phone]  Fax:  [phone]  DEA #:  [id number]  Please advise at [phone].

## 2021-04-06 DIAGNOSIS — M810 Age-related osteoporosis without current pathological fracture: Secondary | ICD-10-CM | POA: Diagnosis not present

## 2021-04-06 DIAGNOSIS — M85852 Other specified disorders of bone density and structure, left thigh: Secondary | ICD-10-CM | POA: Diagnosis not present

## 2021-04-15 ENCOUNTER — Other Ambulatory Visit: Payer: Self-pay | Admitting: Family Medicine

## 2021-04-19 ENCOUNTER — Telehealth (INDEPENDENT_AMBULATORY_CARE_PROVIDER_SITE_OTHER): Payer: PPO | Admitting: Nurse Practitioner

## 2021-04-19 ENCOUNTER — Other Ambulatory Visit: Payer: Self-pay

## 2021-04-19 ENCOUNTER — Encounter: Payer: Self-pay | Admitting: Nurse Practitioner

## 2021-04-19 DIAGNOSIS — J42 Unspecified chronic bronchitis: Secondary | ICD-10-CM

## 2021-04-19 DIAGNOSIS — J209 Acute bronchitis, unspecified: Secondary | ICD-10-CM

## 2021-04-19 MED ORDER — PREDNISONE 20 MG PO TABS: 40.0000 mg | ORAL_TABLET | Freq: Every day | ORAL | 0 refills | Status: DC

## 2021-04-19 MED ORDER — AZITHROMYCIN 250 MG PO TABS: ORAL_TABLET | ORAL | 0 refills | Status: AC

## 2021-04-19 NOTE — Progress Notes (Signed)
Subjective:    Patient ID: Christina Lyons, female    DOB: 1940-08-28, 81 y.o.   MRN: [id number]  HPI: Christina Lyons is a 81 y.o. female presenting virtually for cough.  Chief Complaint  Patient presents with   Cough   UPPER RESPIRATORY TRACT INFECTION Onset: 2 days ago  Fever: no Body aches: no Chills: no Cough: yes; coughing up light green phlegm  Shortness of breath: yes - albuterol helps Wheezing: no Chest pain: no Chest tightness: no Chest congestion: yes Nasal congestion: no Runny nose: yes -stable  Post nasal drip: no Sneezing: no Sore throat: yes Swollen glands: no Sinus pressure: no Headache: no Face pain: no Toothache: no Ear pain: no  Ear pressure: no  Eyes red/itching:no Eye drainage/crusting: no  Nausea: yes  Vomiting: no Diarrhea: no  Change in appetite: yes; decreased but able to eat food and drinking plenty of fluids  Loss of taste/smell: no  Rash: no Fatigue: yes Sick contacts: no Context: stable Recurrent sinusitis: no Treatments attempted: albuterol inhaler - every 4-5 hours, vicks vapo rub, Tylenol Relief with OTC medications: some  Of note, had PFTs in 2017 that showed "mild chronic bronchitis."  Allergies  Allergen Reactions   Sulfa Antibiotics Hives   Tape Itching    Paper tape    Outpatient Encounter Medications as of 04/19/2021  Medication Sig   albuterol (VENTOLIN HFA) 108 (90 Base) MCG/ACT inhaler Inhale 1 puff into the lungs every 6 (six) hours as needed for wheezing or shortness of breath.   aspirin EC 81 MG tablet Take 81 mg by mouth at bedtime.    azithromycin (ZITHROMAX) 250 MG tablet Take 2 tablets on day 1, then 1 tablet daily on days 2 through 5   cetirizine (ZYRTEC) 10 MG tablet Take 10 mg by mouth at bedtime.   Cholecalciferol (VITAMIN D) 50 MCG (2000 UT) CAPS Take 4,000 Units by mouth daily.   fluticasone (FLONASE) 50 MCG/ACT nasal spray Place 1 spray into both nostrils every 12 (twelve) hours as needed for  allergies.   gabapentin (NEURONTIN) 300 MG capsule Take 300 mg by mouth 2 (two) times daily.   HYDROcodone-acetaminophen (NORCO/VICODIN) 5-325 MG tablet Take 1 tablet by mouth every 6 (six) hours as needed. Chronic Pain. Dx: G89.4   letrozole (FEMARA) 2.5 MG tablet Take 1 tablet (2.5 mg total) by mouth daily.   LORazepam (ATIVAN) 1 MG tablet TAKE 1 TABLET BY MOUTH 2 TIMES DAILY AS NEEDED FOR ANXIETY.   losartan (COZAAR) 50 MG tablet TAKE 1 TABLET BY MOUTH EVERY DAY   meloxicam (MOBIC) 15 MG tablet TAKE 1 TABLET BY MOUTH EVERY DAY   Multiple Vitamin (MULTIVITAMIN WITH MINERALS) TABS tablet Take 1 tablet by mouth daily with supper.   omeprazole (PRILOSEC) 20 MG capsule TAKE 1 CAPSULE BY MOUTH TWICE A DAY (Patient taking differently: Take 20 mg by mouth 2 (two) times daily before a meal.)   predniSONE (DELTASONE) 20 MG tablet Take 2 tablets (40 mg total) by mouth daily with breakfast.   rosuvastatin (CRESTOR) 10 MG tablet Take 1 tablet (10 mg total) by mouth daily.   sennosides-docusate sodium (SENOKOT-S) 8.6-50 MG tablet Take 1 tablet by mouth daily as needed for constipation.   vitamin C (ASCORBIC ACID) 500 MG tablet Take 500 mg by mouth daily.   zinc gluconate 50 MG tablet Take 50 mg by mouth daily.   temazepam (RESTORIL) 15 MG capsule Take 15 mg by mouth at bedtime. (Patient not taking: Reported  on 04/19/2021)   [DISCONTINUED] montelukast (SINGULAIR) 10 MG tablet Take 10 mg by mouth at bedtime.   [DISCONTINUED] MYRBETRIQ 25 MG TB24 tablet Take 25 mg by mouth daily.   Facility-Administered Encounter Medications as of 04/19/2021  Medication   denosumab (PROLIA) injection 60 mg    Patient Active Problem List   Diagnosis Date Noted   Osteoporosis 10/04/2020   DDD (degenerative disc disease), lumbar 10/04/2020   Genetic testing 08/30/2020   Family history of breast cancer 08/16/2020   Malignant neoplasm of upper-outer quadrant of right breast in female, estrogen receptor positive ([place])  08/15/2020   COVID-19 04/16/2019   Cough 10/06/2015   S/P total knee arthroplasty 01/07/2013   Lymphadenopathy 12/14/2012   Sinusitis, acute 12/14/2012   Hyperlipidemia    Hypertension    Other idiopathic scoliosis, thoracolumbar region     Past Medical History:  Diagnosis Date   DDD (degenerative disc disease), lumbar    Family history of breast cancer 08/16/2020   GERD (gastroesophageal reflux disease)    History of bronchitis    Hyperlipidemia    Hypertension    Osteomyelitis ([place]) 1946   both legs   Osteoporosis    Other idiopathic scoliosis, thoracolumbar region     Relevant past medical, surgical, family and social history reviewed and updated as indicated. Interim medical history since our last visit reviewed.  Review of Systems Per HPI unless specifically indicated above     Objective:    There were no vitals taken for this visit.  Wt Readings from Last 3 Encounters:  03/12/21 217 lb (98.4 kg)  01/08/21 207 lb (93.9 kg)  09/21/20 215 lb 3.2 oz (97.6 kg)    Physical Exam Vitals and nursing note reviewed.  Constitutional:      General: She is not in acute distress.    Appearance: Normal appearance. She is not toxic-appearing.  HENT:     Head: Normocephalic and atraumatic.     Nose: Nose normal. No congestion.  Cardiovascular:     Comments: Unable to assess heart sounds via virtual visit. Pulmonary:     Effort: Pulmonary effort is normal. No respiratory distress.     Comments: Patient talking in complete sentences during telemedicine visit without accessory muscle use.  Patient with occasional congested sounding cough, inspiratory wheeze heard with coughing. Skin:    General: Skin is warm and dry.     Coloration: Skin is not jaundiced or pale.     Findings: No erythema.  Neurological:     Mental Status: She is alert and oriented to person, place, and time.  Psychiatric:        Mood and Affect: Mood normal.        Behavior: Behavior normal.         Thought Content: Thought content normal.        Judgment: Judgment normal.      Assessment & Plan:  1. Acute exacerbation of chronic bronchitis (HCC) Acute.  Given increased work of breathing, sputum production with purulence, treat for chronic bronchitis exacerbation with prednisone and antibiotics.  Patient does report increased use of albuterol inhaler and occasional wheezing.  I also encouraged plenty of hydration, pulmonary toilet with guaifenesin, deep breathing exercises.  Patient to return to clinic if symptoms are not improved early next week.  ER precautions given if symptoms worsen.  - predniSONE (DELTASONE) 20 MG tablet; Take 2 tablets (40 mg total) by mouth daily with breakfast.  Dispense: 10 tablet; Refill: 0 - azithromycin (ZITHROMAX)  250 MG tablet; Take 2 tablets on day 1, then 1 tablet daily on days 2 through 5  Dispense: 6 tablet; Refill: 0    Follow up plan: Return if symptoms worsen or fail to improve.  Due to the catastrophic nature of the COVID-19 pandemic, this video visit was completed soley via audio and visual contact via Careg[facility] due to the restrictions of the COVID-19 pandemic.  All issues as above were discussed and addressed. Physical exam was done as above through visual confirmation on Caregility. If it was felt that the patient should be evaluated in the office, they were directed there. The patient verbally consented to this visit. Location of the patient: home Location of the provider: work Those involved with this call:  Provider: Noemi Chapel, DNP, FNP-C CMA: Elizabeth Palau, CMA Front Desk/Registration: Santina Evans  Time spent on call:  11 minutes with patient face to face via video conference. More than 50% of this time was spent in counseling and coordination of care. 15 minutes total spent in review of patient's record and preparation of their chart. I verified patient identity using two factors (patient name and date of birth). Patient consents  verbally to being seen via telemedicine visit today.

## 2021-05-10 DIAGNOSIS — Z9181 History of falling: Secondary | ICD-10-CM | POA: Diagnosis not present

## 2021-05-10 DIAGNOSIS — G4700 Insomnia, unspecified: Secondary | ICD-10-CM | POA: Diagnosis not present

## 2021-05-10 DIAGNOSIS — E559 Vitamin D deficiency, unspecified: Secondary | ICD-10-CM | POA: Diagnosis not present

## 2021-05-10 DIAGNOSIS — E7800 Pure hypercholesterolemia, unspecified: Secondary | ICD-10-CM | POA: Diagnosis not present

## 2021-05-10 DIAGNOSIS — F32A Depression, unspecified: Secondary | ICD-10-CM | POA: Diagnosis not present

## 2021-05-10 DIAGNOSIS — R5383 Other fatigue: Secondary | ICD-10-CM | POA: Diagnosis not present

## 2021-05-10 DIAGNOSIS — Z1159 Encounter for screening for other viral diseases: Secondary | ICD-10-CM | POA: Diagnosis not present

## 2021-05-10 DIAGNOSIS — Z79899 Other long term (current) drug therapy: Secondary | ICD-10-CM | POA: Diagnosis not present

## 2021-05-10 DIAGNOSIS — Z113 Encounter for screening for infections with a predominantly sexual mode of transmission: Secondary | ICD-10-CM | POA: Diagnosis not present

## 2021-05-10 DIAGNOSIS — Z131 Encounter for screening for diabetes mellitus: Secondary | ICD-10-CM | POA: Diagnosis not present

## 2021-05-10 DIAGNOSIS — G629 Polyneuropathy, unspecified: Secondary | ICD-10-CM | POA: Diagnosis not present

## 2021-05-10 DIAGNOSIS — Z0000 Encounter for general adult medical examination without abnormal findings: Secondary | ICD-10-CM | POA: Diagnosis not present

## 2021-05-10 DIAGNOSIS — I10 Essential (primary) hypertension: Secondary | ICD-10-CM | POA: Diagnosis not present

## 2021-05-10 DIAGNOSIS — E669 Obesity, unspecified: Secondary | ICD-10-CM | POA: Diagnosis not present

## 2021-05-10 DIAGNOSIS — M129 Arthropathy, unspecified: Secondary | ICD-10-CM | POA: Diagnosis not present

## 2021-05-10 DIAGNOSIS — M5030 Other cervical disc degeneration, unspecified cervical region: Secondary | ICD-10-CM | POA: Diagnosis not present

## 2021-05-10 DIAGNOSIS — K5900 Constipation, unspecified: Secondary | ICD-10-CM | POA: Diagnosis not present

## 2021-05-10 DIAGNOSIS — D539 Nutritional anemia, unspecified: Secondary | ICD-10-CM | POA: Diagnosis not present

## 2021-05-10 DIAGNOSIS — F419 Anxiety disorder, unspecified: Secondary | ICD-10-CM | POA: Diagnosis not present

## 2021-05-14 DIAGNOSIS — Z79899 Other long term (current) drug therapy: Secondary | ICD-10-CM | POA: Diagnosis not present

## 2021-05-21 ENCOUNTER — Telehealth: Payer: Self-pay | Admitting: Family Medicine

## 2021-05-21 DIAGNOSIS — G8929 Other chronic pain: Secondary | ICD-10-CM

## 2021-05-21 DIAGNOSIS — M25562 Pain in left knee: Secondary | ICD-10-CM

## 2021-05-21 NOTE — Telephone Encounter (Signed)
Received call from patient to follow up on recent cortisone shot; states it didn't help for long and she's now wobbling.   Patient spoke with provider about receiving a referral to a specialist if cortisone shot didn't work; wants to move forward with that plan asap.  Please advise at [phone].

## 2021-05-22 NOTE — Telephone Encounter (Signed)
Referral placed to ortho. Patient advised. Nothing further needed at this time.

## 2021-05-23 ENCOUNTER — Other Ambulatory Visit: Payer: Self-pay | Admitting: Family Medicine

## 2021-05-23 NOTE — Telephone Encounter (Signed)
LOV 03/12/21 Last refill 03/19/21, #60, 0 refills  Patient was also given Tramadol, 50mg , #120 on 05/10/21  Please review, thanks!

## 2021-05-23 NOTE — Telephone Encounter (Signed)
Patient called to request refill of  HYDROcodone-acetaminophen (NORCO/VICODIN) 5-325 MG tablet [[id number]]  .  Pharmacy confirmed as   [id number] [place] [place]  [address] [place] [address]  Phone:  [phone]  Fax:  [phone]  DEA #:  [id number]  Please advise at [phone].

## 2021-05-24 MED ORDER — HYDROCODONE-ACETAMINOPHEN 5-325 MG PO TABS: 1.0000 | ORAL_TABLET | Freq: Four times a day (QID) | ORAL | 0 refills | Status: DC | PRN

## 2021-05-28 ENCOUNTER — Other Ambulatory Visit: Payer: Self-pay | Admitting: Family Medicine

## 2021-05-28 MED ORDER — HYDROCODONE-ACETAMINOPHEN 7.5-325 MG PO TABS: 1.0000 | ORAL_TABLET | Freq: Four times a day (QID) | ORAL | 0 refills | Status: DC | PRN

## 2021-05-28 NOTE — Telephone Encounter (Signed)
Norco 5/325 is not available. Please advise, thanks!

## 2021-05-29 ENCOUNTER — Ambulatory Visit (INDEPENDENT_AMBULATORY_CARE_PROVIDER_SITE_OTHER): Payer: PPO

## 2021-05-29 ENCOUNTER — Other Ambulatory Visit: Payer: Self-pay

## 2021-05-29 ENCOUNTER — Ambulatory Visit: Payer: PPO | Admitting: Orthopaedic Surgery

## 2021-05-29 ENCOUNTER — Telehealth: Payer: Self-pay

## 2021-05-29 VITALS — Ht 60.0 in | Wt 217.0 lb

## 2021-05-29 DIAGNOSIS — Z6841 Body Mass Index (BMI) 40.0 and over, adult: Secondary | ICD-10-CM | POA: Diagnosis not present

## 2021-05-29 DIAGNOSIS — G8929 Other chronic pain: Secondary | ICD-10-CM | POA: Diagnosis not present

## 2021-05-29 DIAGNOSIS — M25562 Pain in left knee: Secondary | ICD-10-CM

## 2021-05-29 DIAGNOSIS — M1712 Unilateral primary osteoarthritis, left knee: Secondary | ICD-10-CM | POA: Diagnosis not present

## 2021-05-29 NOTE — Telephone Encounter (Signed)
Please submit for left knee gel inj °

## 2021-05-29 NOTE — Progress Notes (Signed)
Office Visit Note   Patient: Christina Lyons           Date of Birth: 03-22-1941           MRN: [id number] Visit Date: 05/29/2021              Requested by: Christina Frizzle, MD [address] [facility] [place],  [place] [address] PCP: Christina Frizzle, MD   Assessment & Plan: Visit Diagnoses:  1. Primary osteoarthritis of left knee   2. Body mass index 40.0-44.9, adult ([place])   3. Morbid obesity ([place])     Plan: Impression is advanced valgus DJD of the left knee.  Treatment options were discussed to include total knee replacement, Visco injection, Zilretta injection.  We will try to get approval for Visco and Zilretta.  We will see her back for the injections.  She understands that she has advanced DJD and is likely facing a knee replacement so she will make a concerted effort at weight loss.  The patient meets the AMA guidelines for Morbid (severe) obesity with a BMI > 40.0 and I have recommended weight loss.  This patient is diagnosed with osteoarthritis of the knee(s).    Radiographs show evidence of joint space narrowing, osteophytes, subchondral sclerosis and/or subchondral cysts.  This patient has knee pain which interferes with functional and activities of daily living.    This patient has experienced inadequate response, adverse effects and/or intolerance with conservative treatments such as acetaminophen, NSAIDS, topical creams, physical therapy or regular exercise, knee bracing and/or weight loss.   This patient has experienced inadequate response or has a contraindication to intra articular steroid injections for at least 3 months.   This patient is not scheduled to have a total knee replacement within 6 months of starting treatment with viscosupplementation.  Follow-Up Instructions: No follow-ups on file.   Orders:  Orders Placed This Encounter  Procedures   XR KNEE 3 VIEW LEFT   No orders of the defined types were placed in this encounter.     Procedures: No  procedures performed   Clinical Data: No additional findings.   Subjective: Chief Complaint  Patient presents with   Left Knee - Pain    HPI  Christina Lyons is a very pleasant 81 year old female referral from Christina Lyons for chronic severe left knee pain.  She had a cortisone injection about 2-1/2 months ago with only about 3 weeks of relief.  She walks with a cane.  She did have a right total knee replacement that was done over 10 years ago.  Meloxicam does not help the left knee pain.  She denies any injuries.  Currently only Norco helps with the pain.  Review of Systems  Constitutional: Negative.   HENT: Negative.    Eyes: Negative.   Respiratory: Negative.    Cardiovascular: Negative.   Endocrine: Negative.   Musculoskeletal: Negative.   Neurological: Negative.   Hematological: Negative.   Psychiatric/Behavioral: Negative.    All other systems reviewed and are negative.   Objective: Vital Signs: Ht 5' (1.524 m)    Wt 217 lb (98.4 kg)    BMI 42.38 kg/m   Physical Exam Vitals and nursing note reviewed.  Constitutional:      Appearance: She is well-developed.  HENT:     Head: Normocephalic and atraumatic.  Pulmonary:     Effort: Pulmonary effort is normal.  Abdominal:     Palpations: Abdomen is soft.  Musculoskeletal:     Cervical  back: Neck supple.  Skin:    General: Skin is warm.     Capillary Refill: Capillary refill takes less than 2 seconds.  Neurological:     Mental Status: She is alert and oriented to person, place, and time.  Psychiatric:        Behavior: Behavior normal.        Thought Content: Thought content normal.        Judgment: Judgment normal.    Ortho Exam  Examination of the left knee shows a mild valgus deformity.  No joint effusion.  2+ patellofemoral crepitus with range of motion.  Pain with flexion of the knee past 90 degrees.  Collaterals and cruciates are intact.  Specialty Comments:  No specialty comments available.  Imaging: XR KNEE  3 VIEW LEFT  Result Date: 05/29/2021 Advanced valgus DJD with bone on bone joint space narrowing.    PMFS History: Patient Active Problem List   Diagnosis Date Noted   Primary osteoarthritis of left knee 05/29/2021   Body mass index 40.0-44.9, adult ([place]) 05/29/2021   Morbid obesity ([place]) 05/29/2021   Acute exacerbation of chronic bronchitis ([place]) 04/19/2021   Osteoporosis 10/04/2020   DDD (degenerative disc disease), lumbar 10/04/2020   Genetic testing 08/30/2020   Family history of breast cancer 08/16/2020   Malignant neoplasm of upper-outer quadrant of right breast in female, estrogen receptor positive ([place]) 08/15/2020   COVID-19 04/16/2019   Cough 10/06/2015   S/P total knee arthroplasty 01/07/2013   Lymphadenopathy 12/14/2012   Sinusitis, acute 12/14/2012   Hyperlipidemia    Hypertension    Other idiopathic scoliosis, thoracolumbar region    Past Medical History:  Diagnosis Date   DDD (degenerative disc disease), lumbar    Family history of breast cancer 08/16/2020   GERD (gastroesophageal reflux disease)    History of bronchitis    Hyperlipidemia    Hypertension    Osteomyelitis ([place]) 1946   both legs   Osteoporosis    Other idiopathic scoliosis, thoracolumbar region     Family History  Problem Relation Age of Onset   Asthma Mother    Asthma Sister    Heart failure Father    Breast cancer Sister        contralateral; dx 80; dx 63   Breast cancer Maternal Aunt 54   Cancer Paternal Aunt        unknown "female" cancer; dx before 60   Throat cancer Paternal Uncle        dx after 77    Past Surgical History:  Procedure Laterality Date   ABDOMINAL HYSTERECTOMY     BREAST LUMPECTOMY Right 09/12/2020   Procedure: RIGHT BREAST LUMPECTOMY;  Surgeon: Christina Klein, MD;  Location: [facility];  Service: General;  Laterality: Right;   CHOLECYSTECTOMY     JOINT REPLACEMENT     right knee   Social History   Occupational History   Occupation: Retired  Tobacco Use    Smoking status: Former    Packs/day: 0.50    Years: 5.00    Pack years: 2.50    Types: Cigarettes    Quit date: 04/01/1980    Years since quitting: 41.1   Smokeless tobacco: Never  Vaping Use   Vaping Use: Never used  Substance and Sexual Activity   Alcohol use: No    Alcohol/week: 0.0 standard drinks   Drug use: No   Sexual activity: Not on file

## 2021-05-30 NOTE — Telephone Encounter (Signed)
Noted  

## 2021-06-19 ENCOUNTER — Other Ambulatory Visit: Payer: Self-pay | Admitting: Family Medicine

## 2021-06-20 ENCOUNTER — Other Ambulatory Visit: Payer: Self-pay

## 2021-06-25 ENCOUNTER — Telehealth: Payer: Self-pay | Admitting: Orthopaedic Surgery

## 2021-06-25 NOTE — Telephone Encounter (Signed)
Patient called needing to know if the injection was approved?  Patient said she has been waiting 6 weeks. The number to contact patient is  [phone] ?

## 2021-06-26 NOTE — Telephone Encounter (Signed)
Called and left a Vm for patient to CB to schedule an appointment with Dr. Erlinda Hong for gel injection. ?

## 2021-06-27 ENCOUNTER — Other Ambulatory Visit: Payer: Self-pay

## 2021-06-27 NOTE — Telephone Encounter (Signed)
Pt called in asking for a refill of HYDROcodone-acetaminophen (NORCO) 7.5-325 MG tablet. ? ?Cb#: [phone] ? ?

## 2021-06-27 NOTE — Telephone Encounter (Signed)
?  LOV 03/12/21 ?Last refill 05/28/2021, #30, 0 refills ? ?Please review, thanks! ? ?

## 2021-06-28 MED ORDER — HYDROCODONE-ACETAMINOPHEN 7.5-325 MG PO TABS: 1.0000 | ORAL_TABLET | Freq: Four times a day (QID) | ORAL | 0 refills | Status: DC | PRN

## 2021-06-28 NOTE — Telephone Encounter (Signed)
Spoke with pt, advise rx at pharmacy ?

## 2021-06-28 NOTE — Telephone Encounter (Signed)
Left msg for pt to call back re meds ?

## 2021-06-29 ENCOUNTER — Telehealth: Payer: Self-pay

## 2021-06-29 NOTE — Telephone Encounter (Signed)
Approved for Monovisc, left knee. ?Christina Lyons ?Patient will be responsible for 20% OOP. ?No Co-pay ?No PA required ? ?Appt. 07/03/2021 with Dr. Erlinda Hong ?

## 2021-07-03 ENCOUNTER — Encounter: Payer: Self-pay | Admitting: Orthopaedic Surgery

## 2021-07-03 ENCOUNTER — Ambulatory Visit: Payer: PPO | Admitting: Orthopaedic Surgery

## 2021-07-03 DIAGNOSIS — M1712 Unilateral primary osteoarthritis, left knee: Secondary | ICD-10-CM

## 2021-07-03 MED ORDER — BUPIVACAINE HCL 0.25 % IJ SOLN
2.0000 mL | INTRAMUSCULAR | Status: AC | PRN
  Administered 2021-07-03: 2 mL via INTRA_ARTICULAR

## 2021-07-03 MED ORDER — METHYLPREDNISOLONE ACETATE 40 MG/ML IJ SUSP
40.0000 mg | INTRAMUSCULAR | Status: AC | PRN
  Administered 2021-07-03: 40 mg via INTRA_ARTICULAR

## 2021-07-03 MED ORDER — LIDOCAINE HCL 1 % IJ SOLN
2.0000 mL | INTRAMUSCULAR | Status: AC | PRN
  Administered 2021-07-03: 2 mL

## 2021-07-03 NOTE — Progress Notes (Signed)
? ?Office Visit Note ?  ?Patient: CANAAN PRUE           ?Date of Birth: May 08, 1940           ?MRN: [id number] ?Visit Date: 07/03/2021 ?             ?Requested by: [name], MD ?[address] [place],  [place] [address] ?PCP: [name], MD ? ? ?Assessment & Plan: ?Visit Diagnoses:  ?1. Unilateral primary osteoarthritis, left knee   ? ? ?Plan: Impression is left knee osteoarthritis.  Today we proceeded with Monovisc injection.  She tolerated this well.  She will ice as needed.  Follow-up with [place] as needed. ? ?Follow-Up Instructions: Return if symptoms worsen or fail to improve.  ? ?Orders:  ?Orders Placed This Encounter  ?Procedures  ? Large Joint Inj: L knee  ? ?No orders of the defined types were placed in this encounter. ? ? ? ? Procedures: ?Large Joint Inj: L knee on 07/03/2021 3:17 PM ?Indications: pain ?Details: 22 G needle, anterolateral approach ?Medications: 2 mL lidocaine 1 %; 2 mL bupivacaine 0.25 %; 40 mg methylPREDNISolone acetate 40 MG/ML ? ? ? ? ?Clinical Data: ?No additional findings. ? ? ?Subjective: ?Chief Complaint  ?Patient presents with  ? Left Knee - Follow-up  ?  Monovisc  ? ? ?HPI patient is a pleasant 44 old female who comes in today for left knee Synvisc injection.  She has an underlying history of left knee osteoarthritis and is undergone cortisone injection without long-lasting relief. ? ? ? ? ?Objective: ?Vital Signs: There were no vitals taken for this visit. ? ? ? ?Ortho Exam stable left knee exam ? ?Specialty Comments:  ?No specialty comments available. ? ?Imaging: ?No new imaging ? ? ?PMFS History: ?Patient Active Problem List  ? Diagnosis Date Noted  ? Primary osteoarthritis of left knee 05/29/2021  ? Body mass index 40.0-44.9, adult ([place]) 05/29/2021  ? Morbid obesity ([place]) 05/29/2021  ? Acute exacerbation of chronic bronchitis ([place]) 04/19/2021  ? Osteoporosis 10/04/2020  ? DDD (degenerative disc disease), lumbar 10/04/2020  ? Genetic testing 08/30/2020  ?  Family history of breast cancer 08/16/2020  ? Malignant neoplasm of upper-outer quadrant of right breast in female, estrogen receptor positive ([place]) 08/15/2020  ? COVID-19 04/16/2019  ? Cough 10/06/2015  ? S/P total knee arthroplasty 01/07/2013  ? Lymphadenopathy 12/14/2012  ? Sinusitis, acute 12/14/2012  ? Hyperlipidemia   ? Hypertension   ? Other idiopathic scoliosis, thoracolumbar region   ? ?Past Medical History:  ?Diagnosis Date  ? DDD (degenerative disc disease), lumbar   ? Family history of breast cancer 08/16/2020  ? GERD (gastroesophageal reflux disease)   ? History of bronchitis   ? Hyperlipidemia   ? Hypertension   ? Osteomyelitis ([place]) 1946  ? both legs  ? Osteoporosis   ? Other idiopathic scoliosis, thoracolumbar region   ?  ?Family History  ?Problem Relation Age of Onset  ? Asthma Mother   ? Asthma Sister   ? Heart failure Father   ? Breast cancer Sister   ?     contralateral; dx 15; dx 64  ? Breast cancer Maternal Aunt 60  ? Cancer Paternal Aunt   ?     unknown "female" cancer; dx before 55  ? Throat cancer Paternal Uncle   ?     dx after 69  ?  ?Past Surgical History:  ?Procedure Laterality Date  ? ABDOMINAL HYSTERECTOMY    ?  BREAST LUMPECTOMY Right 09/12/2020  ? Procedure: RIGHT BREAST LUMPECTOMY;  Surgeon: Stark Klein, MD;  Location: [facility];  Service: General;  Laterality: Right;  ? CHOLECYSTECTOMY    ? JOINT REPLACEMENT    ? right knee  ? ?Social History  ? ?Occupational History  ? Occupation: Retired  ?Tobacco Use  ? Smoking status: Former  ?  Packs/day: 0.50  ?  Years: 5.00  ?  Pack years: 2.50  ?  Types: Cigarettes  ?  Quit date: 04/01/1980  ?  Years since quitting: 41.2  ? Smokeless tobacco: Never  ?Vaping Use  ? Vaping Use: Never used  ?Substance and Sexual Activity  ? Alcohol use: No  ?  Alcohol/week: 0.0 standard drinks  ? Drug use: No  ? Sexual activity: Not on file  ? ? ? ? ? ? ?

## 2021-07-09 ENCOUNTER — Other Ambulatory Visit: Payer: Self-pay | Admitting: Hematology and Oncology

## 2021-07-09 DIAGNOSIS — M79604 Pain in right leg: Secondary | ICD-10-CM | POA: Diagnosis not present

## 2021-07-09 DIAGNOSIS — R2242 Localized swelling, mass and lump, left lower limb: Secondary | ICD-10-CM | POA: Diagnosis not present

## 2021-07-09 DIAGNOSIS — M1712 Unilateral primary osteoarthritis, left knee: Secondary | ICD-10-CM | POA: Diagnosis not present

## 2021-07-09 DIAGNOSIS — M79605 Pain in left leg: Secondary | ICD-10-CM | POA: Diagnosis not present

## 2021-07-12 DIAGNOSIS — M79605 Pain in left leg: Secondary | ICD-10-CM | POA: Diagnosis not present

## 2021-07-12 DIAGNOSIS — M79604 Pain in right leg: Secondary | ICD-10-CM | POA: Diagnosis not present

## 2021-07-24 ENCOUNTER — Other Ambulatory Visit: Payer: Self-pay

## 2021-07-24 DIAGNOSIS — M1712 Unilateral primary osteoarthritis, left knee: Secondary | ICD-10-CM | POA: Diagnosis not present

## 2021-07-24 DIAGNOSIS — Z9181 History of falling: Secondary | ICD-10-CM | POA: Diagnosis not present

## 2021-07-24 DIAGNOSIS — R2242 Localized swelling, mass and lump, left lower limb: Secondary | ICD-10-CM | POA: Diagnosis not present

## 2021-07-24 MED ORDER — HYDROCODONE-ACETAMINOPHEN 7.5-325 MG PO TABS: 1.0000 | ORAL_TABLET | Freq: Four times a day (QID) | ORAL | 0 refills | Status: DC | PRN

## 2021-07-24 NOTE — Telephone Encounter (Signed)
Pt called in requesting a refill of  ?HYDROcodone-acetaminophen (NORCO) 7.5-325 MG tablet [[id number]]  ?  Order Details ?Dose: 1 tablet Route: Oral Frequency: Every 6 hours PRN for moderate pain  ?Dispense Quantity: 30 tablet Refills: 0   ?     ?Sig: Take 1 tablet by mouth every 6 (six) hours as needed for moderate pain.  ?     ?Start Date: 06/28/21 End Date: --  ?Written Date: 06/28/21 Expiration Date: 12/25/21  ? ?

## 2021-07-24 NOTE — Telephone Encounter (Signed)
LOV 03/12/21 ?Last refill 06/28/21, #30, 0 refills ? ?Please review, thanks! ? ?

## 2021-07-25 ENCOUNTER — Other Ambulatory Visit: Payer: Self-pay

## 2021-07-25 ENCOUNTER — Telehealth: Payer: Self-pay

## 2021-07-25 NOTE — Telephone Encounter (Signed)
Please resend  ?LOV 12/12/202 ?Last refill 06/28/21, #30, 0 refills ? ?Please review, thanks!  ?

## 2021-07-25 NOTE — Telephone Encounter (Signed)
Pt called in stating that her knee has been causing extreme pain and has been needing to take more of pain meds a day than prescribed. Pt is asking if she could have her prescription changed to 1 tablet a day?Please advise. ? ?Cb#: [phone] ?

## 2021-07-26 MED ORDER — HYDROCODONE-ACETAMINOPHEN 7.5-325 MG PO TABS: 1.0000 | ORAL_TABLET | Freq: Four times a day (QID) | ORAL | 0 refills | Status: DC | PRN

## 2021-07-31 ENCOUNTER — Ambulatory Visit: Payer: PPO | Admitting: Orthopaedic Surgery

## 2021-08-04 DIAGNOSIS — H9201 Otalgia, right ear: Secondary | ICD-10-CM | POA: Diagnosis not present

## 2021-08-04 DIAGNOSIS — J0110 Acute frontal sinusitis, unspecified: Secondary | ICD-10-CM | POA: Diagnosis not present

## 2021-08-04 DIAGNOSIS — R0981 Nasal congestion: Secondary | ICD-10-CM | POA: Diagnosis not present

## 2021-08-04 DIAGNOSIS — Z0130 Encounter for examination of blood pressure without abnormal findings: Secondary | ICD-10-CM | POA: Diagnosis not present

## 2021-08-04 DIAGNOSIS — R051 Acute cough: Secondary | ICD-10-CM | POA: Diagnosis not present

## 2021-08-04 DIAGNOSIS — Z6841 Body Mass Index (BMI) 40.0 and over, adult: Secondary | ICD-10-CM | POA: Diagnosis not present

## 2021-08-20 ENCOUNTER — Other Ambulatory Visit: Payer: Self-pay | Admitting: Family Medicine

## 2021-08-20 NOTE — Telephone Encounter (Signed)
Patient called to request refill of HYDROcodone-acetaminophen (NORCO) 7.5-325 MG tablet ; has 1 pill left  [facility]  [id number] [place] [place] [address] [place] Phone:  [phone] Fax:  [phone] DEA #:  [id number] If [facility] out of stock, ok to [facility]: [facility] [address] [place] Phone:  [phone] Fax:  [phone] DEA #:  [id number]   Please advise at [phone].

## 2021-08-20 NOTE — Telephone Encounter (Signed)
[facility] and spoke to [place], [facility] about the refill(s) Hydrocodone 7.5/325 mg requested. Asked if it is in stock, she says she has enough for a 30 day supply if it's sent today, but can't guarantee she'll have enough. [facility] called and spoke to [employer], Merchant navy officer about their supply. He says they have more than enough for 30 pills. Will send for submission to [place].

## 2021-08-21 NOTE — Telephone Encounter (Signed)
Requested medications are due for refill today.  yes  Requested medications are on the active medications list.  yes  Last refill. 07/26/2021 #30 0 refills  Future visit scheduled.   no  Notes to clinic.  Medication refill is not delegated.    Requested Prescriptions  Pending Prescriptions Disp Refills   HYDROcodone-acetaminophen (NORCO) 7.5-325 MG tablet 30 tablet 0    Sig: Take 1 tablet by mouth every 6 (six) hours as needed for moderate pain.     Not Delegated - Analgesics:  Opioid Agonist Combinations Failed - 08/20/2021  4:01 PM      Failed - This refill cannot be delegated      Failed - Urine Drug Screen completed in last 360 days      Failed - Valid encounter within last 3 months    Recent Outpatient Visits           4 months ago Acute exacerbation of chronic bronchitis ([place])   [facility] Eulogio Bear, NP   5 months ago Chronic pain of left knee   [place] Pick[place], Cammie Mcgee, MD   1 year ago Chronic pain of left knee   [place] [name], MD   1 year ago Herpes zoster without complication   [place], Cammie Mcgee, MD   1 year ago Pure hypercholesterolemia   [place] Pick[place], Cammie Mcgee, MD       Future Appointments             In 1 month Erlinda Hong, Marylynn Pearson, MD [facility]

## 2021-08-23 MED ORDER — HYDROCODONE-ACETAMINOPHEN 7.5-325 MG PO TABS
1.0000 | ORAL_TABLET | Freq: Four times a day (QID) | ORAL | 0 refills | Status: DC | PRN
Start: 2021-08-23 — End: 2021-09-27

## 2021-08-23 NOTE — Telephone Encounter (Signed)
LOV 04/19/21 Last refill 07/26/21, #30, 0 refills  Please review, thanks!

## 2021-08-30 ENCOUNTER — Encounter: Payer: Self-pay | Admitting: Hematology and Oncology

## 2021-08-30 DIAGNOSIS — Z853 Personal history of malignant neoplasm of breast: Secondary | ICD-10-CM | POA: Diagnosis not present

## 2021-09-10 ENCOUNTER — Telehealth: Payer: Self-pay | Admitting: Hematology and Oncology

## 2021-09-10 NOTE — Telephone Encounter (Signed)
Sched per 6/8 sch msg, message has been left with pt

## 2021-09-17 ENCOUNTER — Ambulatory Visit: Payer: PPO | Admitting: Hematology and Oncology

## 2021-09-26 ENCOUNTER — Telehealth: Payer: Self-pay

## 2021-09-26 NOTE — Telephone Encounter (Signed)
LOV 03/12/21 Last refill 08/23/21, #30, 0 refills  Please review, thanks!

## 2021-09-27 ENCOUNTER — Other Ambulatory Visit: Payer: Self-pay | Admitting: Family Medicine

## 2021-09-27 MED ORDER — HYDROCODONE-ACETAMINOPHEN 7.5-325 MG PO TABS: 1.0000 | ORAL_TABLET | Freq: Four times a day (QID) | ORAL | 0 refills | Status: DC | PRN

## 2021-10-04 ENCOUNTER — Ambulatory Visit: Payer: PPO | Admitting: Orthopaedic Surgery

## 2021-10-05 DIAGNOSIS — Z79899 Other long term (current) drug therapy: Secondary | ICD-10-CM | POA: Diagnosis not present

## 2021-10-05 DIAGNOSIS — I10 Essential (primary) hypertension: Secondary | ICD-10-CM | POA: Diagnosis not present

## 2021-10-05 DIAGNOSIS — M129 Arthropathy, unspecified: Secondary | ICD-10-CM | POA: Diagnosis not present

## 2021-10-05 DIAGNOSIS — M7989 Other specified soft tissue disorders: Secondary | ICD-10-CM | POA: Diagnosis not present

## 2021-10-05 DIAGNOSIS — R110 Nausea: Secondary | ICD-10-CM | POA: Diagnosis not present

## 2021-10-05 DIAGNOSIS — F32A Depression, unspecified: Secondary | ICD-10-CM | POA: Diagnosis not present

## 2021-10-05 DIAGNOSIS — E7800 Pure hypercholesterolemia, unspecified: Secondary | ICD-10-CM | POA: Diagnosis not present

## 2021-10-05 DIAGNOSIS — G4700 Insomnia, unspecified: Secondary | ICD-10-CM | POA: Diagnosis not present

## 2021-10-05 DIAGNOSIS — E559 Vitamin D deficiency, unspecified: Secondary | ICD-10-CM | POA: Diagnosis not present

## 2021-10-05 DIAGNOSIS — Z131 Encounter for screening for diabetes mellitus: Secondary | ICD-10-CM | POA: Diagnosis not present

## 2021-10-05 DIAGNOSIS — Z9181 History of falling: Secondary | ICD-10-CM | POA: Diagnosis not present

## 2021-10-05 DIAGNOSIS — D539 Nutritional anemia, unspecified: Secondary | ICD-10-CM | POA: Diagnosis not present

## 2021-10-05 DIAGNOSIS — F419 Anxiety disorder, unspecified: Secondary | ICD-10-CM | POA: Diagnosis not present

## 2021-10-05 DIAGNOSIS — R5383 Other fatigue: Secondary | ICD-10-CM | POA: Diagnosis not present

## 2021-10-05 DIAGNOSIS — M5030 Other cervical disc degeneration, unspecified cervical region: Secondary | ICD-10-CM | POA: Diagnosis not present

## 2021-10-18 DIAGNOSIS — H532 Diplopia: Secondary | ICD-10-CM | POA: Diagnosis not present

## 2021-10-18 DIAGNOSIS — H52203 Unspecified astigmatism, bilateral: Secondary | ICD-10-CM | POA: Diagnosis not present

## 2021-10-18 DIAGNOSIS — H5203 Hypermetropia, bilateral: Secondary | ICD-10-CM | POA: Diagnosis not present

## 2021-10-19 ENCOUNTER — Ambulatory Visit: Payer: PPO | Admitting: Orthopaedic Surgery

## 2021-10-22 NOTE — Progress Notes (Signed)
Patient Care Team: [name], MD as PCP - General (Family Medicine) Stark Klein, MD as Consulting Physician (General Surgery) Nicholas Lose, MD as Consulting Physician (Hematology and Oncology) Eppie Gibson, MD as Attending Physician (Radiation Oncology) Delice Bison Charlestine Massed, NP as Nurse Practitioner (Hematology and Oncology) Harmon Pier, RN as Registered Nurse  DIAGNOSIS: No diagnosis found.  SUMMARY OF ONCOLOGIC HISTORY: Oncology History  Malignant neoplasm of upper-outer quadrant of right breast in female, estrogen receptor positive ([place])  08/10/2020 Initial Diagnosis   Palpable right breast lump 12 o'clock position near areola: 0.5 cm by mammogram and 0.7 cm by ultrasound: Biopsy grade 2 IDC ER 95% PR 95%, Ki-67 10%, HER2 negative   08/16/2020 Cancer Staging   Staging form: Breast, AJCC 8th Edition - Clinical stage from 08/16/2020: Stage IA (cT1b, cN0, cM0, G2, ER+, PR+, HER2-) - Signed by Nicholas Lose, MD on 08/16/2020 Histologic grading system: 3 grade system   08/30/2020 Genetic Testing   Negative hereditary cancer genetic testing: no pathogenic variants detected in [place] +RNAinsight Panel.  The report date is August 30, 2020.   The [facility]+RNAinsight panel offered by [facility] includes sequencing and rearrangement analysis for the following 47 genes:  APC, ATM, AXIN2, BARD1, BMPR1A, BRCA1, BRCA2, BRIP1, CDH1, CDK4, CDKN2A, CHEK2, DICER1, EPCAM, GREM1, HOXB13, MEN1, MLH1, MSH2, MSH3, MSH6, MUTYH, NBN, NF1, NF2, NTHL1, PALB2, PMS2, POLD1, POLE, PTEN, RAD51C, RAD51D, RECQL, RET, SDHA, SDHAF2, SDHB, SDHC, SDHD, SMAD4, SMARCA4, STK11, TP53, TSC1, TSC2, and VHL.  RNA data is routinely analyzed for use in variant interpretation for all genes.   09/2020 -  Anti-estrogen oral therapy   Letrozole daily     CHIEF COMPLIANT:  Follow-up for left breast cancer  INTERVAL HISTORY: Christina Lyons is a 81 y.o. with above-mentioned history of left  breast cancer. She presents to the clinic today for a follow-up.     ALLERGIES:  is allergic to sulfa antibiotics and tape.  MEDICATIONS:  Current Outpatient Medications  Medication Sig Dispense Refill   albuterol (VENTOLIN HFA) 108 (90 Base) MCG/ACT inhaler Inhale 1 puff into the lungs every 6 (six) hours as needed for wheezing or shortness of breath.     aspirin EC 81 MG tablet Take 81 mg by mouth at bedtime.      cetirizine (ZYRTEC) 10 MG tablet Take 10 mg by mouth at bedtime.     Cholecalciferol (VITAMIN D) 50 MCG (2000 UT) CAPS Take 4,000 Units by mouth daily.     fluticasone (FLONASE) 50 MCG/ACT nasal spray Place 1 spray into both nostrils every 12 (twelve) hours as needed for allergies.     gabapentin (NEURONTIN) 300 MG capsule Take 300 mg by mouth 2 (two) times daily.     HYDROcodone-acetaminophen (NORCO) 7.5-325 MG tablet Take 1 tablet by mouth every 6 (six) hours as needed for moderate pain. 30 tablet 0   letrozole (FEMARA) 2.5 MG tablet TAKE 1 TABLET BY MOUTH EVERY DAY 90 tablet 3   LORazepam (ATIVAN) 1 MG tablet TAKE 1 TABLET BY MOUTH 2 TIMES DAILY AS NEEDED FOR ANXIETY. 60 tablet 0   losartan (COZAAR) 50 MG tablet TAKE 1 TABLET BY MOUTH EVERY DAY 90 tablet 3   meloxicam (MOBIC) 15 MG tablet TAKE 1 TABLET BY MOUTH EVERY DAY 30 tablet 4   montelukast (SINGULAIR) 10 MG tablet Take 10 mg by mouth daily.     Multiple Vitamin (MULTIVITAMIN WITH MINERALS) TABS tablet Take 1 tablet by mouth daily with supper.  omeprazole (PRILOSEC) 20 MG capsule TAKE 1 CAPSULE BY MOUTH TWICE A DAY 180 capsule 3   predniSONE (DELTASONE) 20 MG tablet Take 2 tablets (40 mg total) by mouth daily with breakfast. 10 tablet 0   rosuvastatin (CRESTOR) 10 MG tablet Take 1 tablet (10 mg total) by mouth daily. 90 tablet 3   sennosides-docusate sodium (SENOKOT-S) 8.6-50 MG tablet Take 1 tablet by mouth daily as needed for constipation.     temazepam (RESTORIL) 15 MG capsule Take 15 mg by mouth at bedtime.      traMADol (ULTRAM) 50 MG tablet Take 50 mg by mouth 4 (four) times daily as needed.     vitamin C (ASCORBIC ACID) 500 MG tablet Take 500 mg by mouth daily.     zinc gluconate 50 MG tablet Take 50 mg by mouth daily.     Current Facility-Administered Medications  Medication Dose Route Frequency Provider Last Rate Last Admin   denosumab (PROLIA) injection 60 mg  60 mg Subcutaneous Q6 months [name], MD   60 mg at 12/29/18 1442    PHYSICAL EXAMINATION: ECOG PERFORMANCE STATUS: {CHL ONC ECOG [phone]}  There were no vitals filed for this visit. There were no vitals filed for this visit.  BREAST:*** No palpable masses or nodules in either right or left breasts. No palpable axillary supraclavicular or infraclavicular adenopathy no breast tenderness or nipple discharge. (exam performed in the presence of a chaperone)  LABORATORY DATA:  I have reviewed the data as listed    Latest Ref Rng & Units 08/16/2020   12:20 PM 06/30/2020   12:21 PM 03/16/2020   11:43 AM  CMP  Glucose 70 - 99 mg/dL 88  88  83   BUN 8 - 23 mg/dL 23  27  24    Creatinine 0.44 - 1.00 mg/dL 0.67  0.61  0.55   Sodium 135 - 145 mmol/L 142  143  141   Potassium 3.5 - 5.1 mmol/L 3.9  4.2  3.9   Chloride 98 - 111 mmol/L 103  106  104   CO2 22 - 32 mmol/L 29  23  28    Calcium 8.9 - 10.3 mg/dL 9.4  9.3  9.1   Total Protein 6.5 - 8.1 g/dL 6.9  6.2  6.1   Total Bilirubin 0.3 - 1.2 mg/dL 0.5  0.5  0.5   Alkaline Phos 38 - 126 U/L 89     AST 15 - 41 U/L 20  16  14    ALT 0 - 44 U/L 13  10  11      Lab Results  Component Value Date   WBC 7.4 08/16/2020   HGB 14.2 08/16/2020   HCT 42.5 08/16/2020   MCV 100.7 (H) 08/16/2020   PLT 151 08/16/2020   NEUTROABS 5.3 08/16/2020    ASSESSMENT & PLAN:  No problem-specific Assessment & Plan notes found for this encounter.    No orders of the defined types were placed in this encounter.  The patient has a good understanding of the overall plan. she agrees with it.  she will call with any problems that may develop before the next visit here. Total time spent: 30 mins including face to face time and time spent for planning, charting and co-ordination of care   Suzzette Righter, [place] 10/22/21    I Gardiner Coins am scribing for Dr. Lindi Adie  ***

## 2021-10-23 ENCOUNTER — Inpatient Hospital Stay: Payer: PPO | Attending: Hematology and Oncology | Admitting: Hematology and Oncology

## 2021-10-23 ENCOUNTER — Encounter: Payer: Self-pay | Admitting: General Practice

## 2021-10-23 ENCOUNTER — Other Ambulatory Visit: Payer: Self-pay

## 2021-10-23 DIAGNOSIS — Z79899 Other long term (current) drug therapy: Secondary | ICD-10-CM | POA: Diagnosis not present

## 2021-10-23 DIAGNOSIS — Z79811 Long term (current) use of aromatase inhibitors: Secondary | ICD-10-CM | POA: Diagnosis not present

## 2021-10-23 DIAGNOSIS — C50411 Malignant neoplasm of upper-outer quadrant of right female breast: Secondary | ICD-10-CM | POA: Diagnosis not present

## 2021-10-23 DIAGNOSIS — Z170 Estrogen receptor positive status [ER+]: Secondary | ICD-10-CM | POA: Diagnosis not present

## 2021-10-23 DIAGNOSIS — Z7982 Long term (current) use of aspirin: Secondary | ICD-10-CM | POA: Diagnosis not present

## 2021-10-23 MED ORDER — VENLAFAXINE HCL ER 37.5 MG PO CP24: 37.5000 mg | ORAL_CAPSULE | Freq: Every day | ORAL | 3 refills | Status: DC

## 2021-10-23 NOTE — Assessment & Plan Note (Addendum)
08/10/2020:Palpable right breast lump 12 o'clock position near areola: 0.5 cm by mammogram and 0.7 cm by ultrasound: Biopsy grade 2 IDC ER 95% PR 95%, Ki-67 10%, HER2 negative  Treatment plan: 1.Right Lumpectomy: 09/14/20: Grade 2 IDC 1.2 cm, Margins Neg, ER 95%, PR 95%, Ki 67 1%, Her-2 Neg 3. +/-Adjuvant radiation (patient informed me that she does not want to do radiation).  She has an appointment with radiation oncology coming up. 4.Adjuvant antiestrogen therapy with Letrozole 2.5 mg daily x5 years started July 2022 Genetic testing 08/30/2020: No pathogenic mutations ------------------------------------------------------------------------------------------------------------------------- Letrozole toxicities:  Breast cancer surveillance: 1.  Breast exam 10/23/2021: Benign 2. mammogram at [facility] 08/30/2021: Benign breast density category A  Return to clinic in 1 year for follow-up

## 2021-10-23 NOTE — Progress Notes (Signed)
[facility] Spiritual Care Note  Referred by Dr Lindi Adie for emotional and bereavement support, as Ms Ridge niece, who was one of her primary supporters and called every day, just died unexpectedly three weeks ago.  Brought a handmade blessing blanket as a gesture of support and encouragement, including Spiritual Care brochure with chaplain's direct number. Provided empathic listening, pastoral reflection, normalization of feelings, and affirmation of strengths (self-awareness, reflection, willingness to engage support resources, and tools for enjoyment and meaning-making such as embroidery). Ms Belinsky reports support from her pastor, family, and faith as she navigates this loss.  Because chaplain will be out of office next week, we plan to follow up by phone in ca two weeks for pastoral check-in and exploration of additional support resources.   [place], [place], [facility] Pager [phone] Voicemail [phone]

## 2021-10-24 ENCOUNTER — Ambulatory Visit: Payer: PPO | Admitting: Orthopaedic Surgery

## 2021-10-24 ENCOUNTER — Encounter: Payer: Self-pay | Admitting: Orthopaedic Surgery

## 2021-10-24 DIAGNOSIS — M1712 Unilateral primary osteoarthritis, left knee: Secondary | ICD-10-CM | POA: Diagnosis not present

## 2021-10-24 NOTE — Progress Notes (Signed)
Office Visit Note   Patient: Christina Lyons           Date of Birth: March 23, 1941           MRN: [id number] Visit Date: 10/24/2021              Requested by: [name], MD [address] [facility] [place],   [id number] PCP: [name], MD   Assessment & Plan: Visit Diagnoses:  1. Unilateral primary osteoarthritis, left knee     Plan: Impression is left knee osteoarthritis.  Today, we discussed various treatment options to include repeat cortisone injection versus viscosupplementation injection versus total knee arthroplasty.  It is too early to get approval for viscosupplementation injections as she would like to proceed with cortisone injection.  She is trying her best to lose weight in order to proceed with total knee arthroplasty at some point in future.  Follow-up as needed.  Follow-Up Instructions: Return if symptoms worsen or fail to improve.   Orders:  No orders of the defined types were placed in this encounter.  No orders of the defined types were placed in this encounter.     Procedures: Large Joint Inj: L knee on 10/26/2021 9:39 PM Details: 22 G needle Medications: 2 mL bupivacaine 0.5 %; 2 mL lidocaine 1 %; 40 mg methylPREDNISolone acetate 40 MG/ML Outcome: tolerated well, no immediate complications Patient was prepped and draped in the usual sterile fashion.       Clinical Data: No additional findings.   Subjective: Chief Complaint  Patient presents with   Left Knee - Follow-up, Pain    HPI patient is a pleasant 81 year old female who comes in today with recurrent left knee pain.  History of underlying osteoarthritis.  She is undergone cortisone as well as viscosupplementation injections by [place] in the past.  Previous cortisone injection was 7 to 8 months ago which provided a few weeks relief.  Previous Monovisc injection was on 07/03/2021 which helped until recently.  Her symptoms have returned and are worse with activity.  She takes  over-the-counter narcotic pain medication.  She is requesting repeat cortisone injection today.  Review of Systems as detailed in HPI.  All others reviewed and are negative.   Objective: Vital Signs: There were no vitals taken for this visit.  Physical Examwell-developed well-nourished female no acute distress.  Alert and oriented x3.  Ortho Exam left knee exam shows significant valgus deformity.  Range of motion 0 to 100 degrees.  Medial and lateral joint line tenderness.  She is neurovascular tact distally. Specialty Comments:  No specialty comments available.  Imaging: No new imaging   PMFS History: Patient Active Problem List   Diagnosis Date Noted   Primary osteoarthritis of left knee 05/29/2021   Body mass index 40.0-44.9, adult ([place]) 05/29/2021   Morbid obesity ([place]) 05/29/2021   Acute exacerbation of chronic bronchitis ([place]) 04/19/2021   Osteoporosis 10/04/2020   DDD (degenerative disc disease), lumbar 10/04/2020   Genetic testing 08/30/2020   Family history of breast cancer 08/16/2020   Malignant neoplasm of upper-outer quadrant of right breast in female, estrogen receptor positive ([place]) 08/15/2020   COVID-19 04/16/2019   Cough 10/06/2015   S/P total knee arthroplasty 01/07/2013   Lymphadenopathy 12/14/2012   Sinusitis, acute 12/14/2012   Hyperlipidemia    Hypertension    Other idiopathic scoliosis, thoracolumbar region    Past Medical History:  Diagnosis Date   DDD (degenerative disc disease), lumbar    Family  history of breast cancer 08/16/2020   GERD (gastroesophageal reflux disease)    History of bronchitis    Hyperlipidemia    Hypertension    Osteomyelitis ([place]) 1946   both legs   Osteoporosis    Other idiopathic scoliosis, thoracolumbar region     Family History  Problem Relation Age of Onset   Asthma Mother    Asthma Sister    Heart failure Father    Breast cancer Sister        contralateral; dx 38; dx 52   Breast cancer Maternal Aunt 72    Cancer Paternal Aunt        unknown "female" cancer; dx before 60   Throat cancer Paternal Uncle        dx after 56    Past Surgical History:  Procedure Laterality Date   ABDOMINAL HYSTERECTOMY     BREAST LUMPECTOMY Right 09/12/2020   Procedure: RIGHT BREAST LUMPECTOMY;  Surgeon: Stark Klein, MD;  Location: [facility];  Service: General;  Laterality: Right;   CHOLECYSTECTOMY     JOINT REPLACEMENT     right knee   Social History   Occupational History   Occupation: Retired  Tobacco Use   Smoking status: Former    Packs/day: 0.50    Years: 5.00    Total pack years: 2.50    Types: Cigarettes    Quit date: 04/01/1980    Years since quitting: 41.5   Smokeless tobacco: Never  Vaping Use   Vaping Use: Never used  Substance and Sexual Activity   Alcohol use: No    Alcohol/week: 0.0 standard drinks of alcohol   Drug use: No   Sexual activity: Not on file

## 2021-10-26 DIAGNOSIS — M1712 Unilateral primary osteoarthritis, left knee: Secondary | ICD-10-CM

## 2021-10-26 MED ORDER — BUPIVACAINE HCL 0.5 % IJ SOLN
2.0000 mL | INTRAMUSCULAR | Status: AC | PRN
  Administered 2021-10-26: 2 mL via INTRA_ARTICULAR

## 2021-10-26 MED ORDER — METHYLPREDNISOLONE ACETATE 40 MG/ML IJ SUSP
40.0000 mg | INTRAMUSCULAR | Status: AC | PRN
  Administered 2021-10-26: 40 mg via INTRA_ARTICULAR

## 2021-10-26 MED ORDER — LIDOCAINE HCL 1 % IJ SOLN
2.0000 mL | INTRAMUSCULAR | Status: AC | PRN
  Administered 2021-10-26: 2 mL

## 2021-11-05 ENCOUNTER — Ambulatory Visit: Payer: Self-pay

## 2021-11-05 DIAGNOSIS — L03116 Cellulitis of left lower limb: Secondary | ICD-10-CM | POA: Diagnosis not present

## 2021-11-05 NOTE — Telephone Encounter (Signed)
  Chief Complaint: Red swollen foot d/t insect bite Symptoms: Red swollen(tight), itch Frequency: Yesterday afternoon Pertinent Negatives: Patient denies Fever Disposition: '[]'$ ED /'[x]'$ Urgent Care (no appt availability in office) / '[]'$ Appointment(In office/virtual)/ '[]'$  [facility]/ '[]'$ Home Care/ '[]'$ Refused Recommended Disposition /'[]'$ [facility]/ '[]'$  Follow-up with PCP Additional Notes: Pt was in bed sleeping yesterday when she was bitten by something. Pt thinks it was a mosquito. Foot is now red, itchy swollen and painful. Pt will go to UC for evaluation.    Summary: insect bite   Pt called in stating that she may have been bitten by an insect on her foot. Pt states that foot is really red and swollen.   Cb#: [phone]      Reason for Disposition  [1] SEVERE bite pain AND [2] not improved after 2 hours of pain medicine  Answer Assessment - Initial Assessment Questions 1. TYPE of INSECT: "What type of insect was it?"      Unsure maybe a mosquito 2. ONSET: "When did you get bitten?"      Yesterday in bed 3. LOCATION: "Where is the insect bite located?"      Left foot towards the outside.  4. REDNESS: "Is the area red or pink?" If Yes, ask: "What size is area of redness?" (inches or cm). "When did the redness start?"     Whole top of foot. Started yesterday after "rubbing the area" 5. PAIN: "Is there any pain?" If Yes, ask: "How bad is it?"  (Scale 1-10; or mild, moderate, severe)     mild 6. ITCHING: "Does it itch?" If Yes, ask: "How bad is the itch?"    - MILD: doesn't interfere with normal activities   - MODERATE-SEVERE: interferes with work, school, sleep, or other activities      mild 7. SWELLING: "How big is the swelling?" (inches, cm, or compare to coins)     Cover the top of the foot. 8. OTHER SYMPTOMS: "Do you have any other symptoms?"  (e.g., difficulty breathing, hives)     no 9. PREGNANCY: "Is there any chance you are pregnant?" "When was your last  menstrual period?"     na  Protocols used: Insect Bite-A-AH

## 2021-11-06 ENCOUNTER — Encounter: Payer: Self-pay | Admitting: General Practice

## 2021-11-06 NOTE — Progress Notes (Signed)
[facility] Spiritual Care Note  Attempted follow-up by phone, leaving voicemail with encouragement to return call. Will phone again later in the week if needed.   [place], [place], [facility] Pager [phone] Voicemail [phone]

## 2021-11-07 ENCOUNTER — Encounter: Payer: Self-pay | Admitting: General Practice

## 2021-11-07 NOTE — Progress Notes (Signed)
[facility] Spiritual Care Note  Followed up with Ms Christina Lyons by phone. She was very appreciative of call and shared about all her recent acts of self-care, including reaching out to her late niece's family to offer support and care, which was healing and rewarding. Ms Christina Lyons also notes that a new prescription has led her to feel "much better" emotionally, resolving the "emptiness" that she had been feeling upon waking in the mornings.  Provided empathic listening, normalization of feelings, emotional support, and affirmation of strengths. Ensured that Ms Christina Lyons is aware of free grief counseling and support groups available through local hospice ([facility]). She has direct Spiritual Care number and plans to reach out as needed/desired.   [place], [place], [facility] Pager [phone] Voicemail [phone]

## 2021-11-09 ENCOUNTER — Other Ambulatory Visit: Payer: Self-pay

## 2021-11-09 NOTE — Telephone Encounter (Signed)
Requested medication (s) are due for refill today: yes  Requested medication (s) are on the active medication list: yes  Last refill:  11/20/20 #90 3 refills  Future visit scheduled: no  Notes to clinic:  protocol failed. Last labs 08/16/20 patient needs appt . Do you want to give courtesy refill?     Requested Prescriptions  Pending Prescriptions Disp Refills   losartan (COZAAR) 50 MG tablet 90 tablet 3    Sig: Take 1 tablet (50 mg total) by mouth daily.     Cardiovascular:  Angiotensin Receptor Blockers Failed - 11/09/2021  4:01 PM      Failed - Cr in normal range and within 180 days    Creatinine  Date Value Ref Range Status  08/16/2020 0.67 0.44 - 1.00 mg/dL Final   Creat  Date Value Ref Range Status  06/30/2020 0.61 0.60 - 0.93 mg/dL Final    Comment:    For patients >6 years of age, the reference limit for Creatinine is approximately 13% higher for people identified as African-American. .          Failed - K in normal range and within 180 days    Potassium  Date Value Ref Range Status  08/16/2020 3.9 3.5 - 5.1 mmol/L Final         Failed - Last BP in normal range    BP Readings from Last 1 Encounters:  10/23/21 (!) 149/85         Failed - Valid encounter within last 6 months    Recent Outpatient Visits           6 months ago Acute exacerbation of chronic bronchitis ([place])   [place] Eulogio Bear, NP   8 months ago Chronic pain of left knee   [place] Dennard Schaumann, Cammie Mcgee, MD   1 year ago Chronic pain of left knee   [place] [name], MD   1 year ago Herpes zoster without complication   [place] Pickard, Cammie Mcgee, MD   1 year ago Pure hypercholesterolemia   [place], Cammie Mcgee, MD              Passed - Patient is not pregnant

## 2021-11-09 NOTE — Telephone Encounter (Signed)
Pharmacy faxed a refill request for losartan (COZAAR) 50 MG tablet [[id number]]    Order Details Dose, Route, Frequency: As Directed  Dispense Quantity: 90 tablet Refills: 3        Sig: TAKE 1 TABLET BY MOUTH EVERY DAY       Start Date: 11/20/20 End Date: --  Written Date: 11/20/20 Expiration Date: 11/20/21

## 2021-11-12 MED ORDER — LOSARTAN POTASSIUM 50 MG PO TABS: 50.0000 mg | ORAL_TABLET | Freq: Every day | ORAL | 1 refills | Status: DC

## 2021-11-15 ENCOUNTER — Other Ambulatory Visit: Payer: Self-pay | Admitting: Hematology and Oncology

## 2021-11-15 DIAGNOSIS — L03116 Cellulitis of left lower limb: Secondary | ICD-10-CM | POA: Diagnosis not present

## 2021-11-15 DIAGNOSIS — R600 Localized edema: Secondary | ICD-10-CM | POA: Diagnosis not present

## 2021-11-21 ENCOUNTER — Telehealth: Payer: Self-pay | Admitting: Family Medicine

## 2021-11-21 NOTE — Telephone Encounter (Signed)
Patient called requesting a refill to be sent to [facility] [address]. Last OV with provider 03/12/21  HYDROcodone-acetaminophen (NORCO) 7.5-325 MG tablet [[id number]]    Order Details Dose: 1 tablet Route: Oral Frequency: Every 6 hours PRN for moderate pain  Dispense Quantity: 30 tablet Refills: 0        Sig: Take 1 tablet by mouth every 6 (six) hours as needed for moderate pain.       Start Date: 09/27/21 End Date: --  Written Date: 09/27/21 Expiration Date: 03/26/22  Earliest Fill Date: 09/27/21    Original Order:  HYDROcodone-acetaminophen (NORCO) 7.5-325 MG tablet [[id number]]  Providers  Authorizing Provider:   [name], MD  [address] [place], [place] [address]  Phone:  [phone]   Fax:  [phone]  DEA #:  [id number]   NPI:  [id number]      Ordering User:  [name], MD       [id number] [address] [id number]

## 2021-11-22 ENCOUNTER — Other Ambulatory Visit: Payer: Self-pay | Admitting: Family Medicine

## 2021-11-22 MED ORDER — HYDROCODONE-ACETAMINOPHEN 7.5-325 MG PO TABS: 1.0000 | ORAL_TABLET | Freq: Four times a day (QID) | ORAL | 0 refills | Status: DC | PRN

## 2022-01-07 DIAGNOSIS — F32A Depression, unspecified: Secondary | ICD-10-CM | POA: Diagnosis not present

## 2022-01-07 DIAGNOSIS — Z131 Encounter for screening for diabetes mellitus: Secondary | ICD-10-CM | POA: Diagnosis not present

## 2022-01-07 DIAGNOSIS — E559 Vitamin D deficiency, unspecified: Secondary | ICD-10-CM | POA: Diagnosis not present

## 2022-01-07 DIAGNOSIS — M129 Arthropathy, unspecified: Secondary | ICD-10-CM | POA: Diagnosis not present

## 2022-01-07 DIAGNOSIS — J309 Allergic rhinitis, unspecified: Secondary | ICD-10-CM | POA: Diagnosis not present

## 2022-01-07 DIAGNOSIS — M5030 Other cervical disc degeneration, unspecified cervical region: Secondary | ICD-10-CM | POA: Diagnosis not present

## 2022-01-07 DIAGNOSIS — D539 Nutritional anemia, unspecified: Secondary | ICD-10-CM | POA: Diagnosis not present

## 2022-01-07 DIAGNOSIS — R5383 Other fatigue: Secondary | ICD-10-CM | POA: Diagnosis not present

## 2022-01-07 DIAGNOSIS — K5900 Constipation, unspecified: Secondary | ICD-10-CM | POA: Diagnosis not present

## 2022-01-07 DIAGNOSIS — Z79899 Other long term (current) drug therapy: Secondary | ICD-10-CM | POA: Diagnosis not present

## 2022-01-07 DIAGNOSIS — F419 Anxiety disorder, unspecified: Secondary | ICD-10-CM | POA: Diagnosis not present

## 2022-01-07 DIAGNOSIS — I10 Essential (primary) hypertension: Secondary | ICD-10-CM | POA: Diagnosis not present

## 2022-01-07 DIAGNOSIS — E7800 Pure hypercholesterolemia, unspecified: Secondary | ICD-10-CM | POA: Diagnosis not present

## 2022-01-07 DIAGNOSIS — G629 Polyneuropathy, unspecified: Secondary | ICD-10-CM | POA: Diagnosis not present

## 2022-01-07 DIAGNOSIS — Z9181 History of falling: Secondary | ICD-10-CM | POA: Diagnosis not present

## 2022-01-07 DIAGNOSIS — C50911 Malignant neoplasm of unspecified site of right female breast: Secondary | ICD-10-CM | POA: Diagnosis not present

## 2022-01-14 ENCOUNTER — Telehealth: Payer: Self-pay

## 2022-01-14 NOTE — Telephone Encounter (Signed)
Pt called in to request a refill of this med  HYDROcodone-acetaminophen ([place]) 7.5-325 MG tablet [[id number]]    Order Details Dose: 1 tablet Route: Oral Frequency: Every 6 hours PRN for moderate pain  Dispense Quantity: 30 tablet Refills: 0        Sig: Take 1 tablet by mouth every 6 (six) hours as needed for moderate pain.       Start Date: 11/22/21 End Date: --     LOV1/9/23   PHARMACY: [id number] - [address]

## 2022-01-21 ENCOUNTER — Other Ambulatory Visit: Payer: Self-pay | Admitting: Family Medicine

## 2022-01-21 MED ORDER — HYDROCODONE-ACETAMINOPHEN 7.5-325 MG PO TABS
1.0000 | ORAL_TABLET | Freq: Four times a day (QID) | ORAL | 0 refills | Status: DC | PRN
Start: 2022-01-21 — End: 2022-03-15

## 2022-01-21 NOTE — Telephone Encounter (Signed)
Patient called stating she is completely out of medication.   CB# [phone]

## 2022-01-22 ENCOUNTER — Encounter: Payer: Self-pay | Admitting: Orthopaedic Surgery

## 2022-01-22 ENCOUNTER — Telehealth: Payer: Self-pay

## 2022-01-22 ENCOUNTER — Ambulatory Visit: Payer: PPO | Admitting: Orthopaedic Surgery

## 2022-01-22 VITALS — Ht 61.0 in | Wt 210.0 lb

## 2022-01-22 DIAGNOSIS — M1712 Unilateral primary osteoarthritis, left knee: Secondary | ICD-10-CM

## 2022-01-22 MED ORDER — METHYLPREDNISOLONE ACETATE 40 MG/ML IJ SUSP
40.0000 mg | INTRAMUSCULAR | Status: AC | PRN
  Administered 2022-01-22: 40 mg via INTRA_ARTICULAR

## 2022-01-22 MED ORDER — LIDOCAINE HCL 1 % IJ SOLN
2.0000 mL | INTRAMUSCULAR | Status: AC | PRN
  Administered 2022-01-22: 2 mL

## 2022-01-22 MED ORDER — BUPIVACAINE HCL 0.5 % IJ SOLN
2.0000 mL | INTRAMUSCULAR | Status: AC | PRN
  Administered 2022-01-22: 2 mL via INTRA_ARTICULAR

## 2022-01-22 NOTE — Progress Notes (Signed)
Office Visit Note   Patient: Christina Lyons           Date of Birth: July 11, 1940           MRN: [id number] Visit Date: 01/22/2022              Requested by: [name], MD [address] [facility] [place],  [place] [address] PCP: [name], MD   Assessment & Plan: Visit Diagnoses:  1. Unilateral primary osteoarthritis, left knee     Plan: Impression is left knee osteoarthritis.  Today, we discussed various treatment options to include repeat cortisone injection versus viscosupplementation injection versus total knee arthroplasty.  She tells me that she would like to proceed with cortisone injection today and then talk about knee replacement surgery in the near future.  She understands she will not be able to proceed with this until the end of January as we injected the left knee with cortisone today.  She is okay with this as she has family coming for about a month for the holidays.  She will follow-up in early January to further discuss surgery with [place].  Follow-Up Instructions: Return if symptoms worsen or fail to improve.   Orders:  No orders of the defined types were placed in this encounter.  No orders of the defined types were placed in this encounter.     Procedures: Large Joint Inj: L knee on 01/22/2022 8:01 PM Details: 22 G needle Medications: 2 mL bupivacaine 0.5 %; 2 mL lidocaine 1 %; 40 mg methylPREDNISolone acetate 40 MG/ML Outcome: tolerated well, no immediate complications Patient was prepped and draped in the usual sterile fashion.       Clinical Data: No additional findings.   Subjective: Chief Complaint  Patient presents with   Left Knee - Pain    HPI patient is a pleasant 81 year old female who comes in today with recurrent left knee pain.  History of osteoarthritis.  She has been injected with cortisone as well as viscosupplementation in the past with good but temporary relief.  She has been working on weight loss in order to proceed with  total knee arthroplasty.  She notes that she is down 8 pounds.  Review of Systems as detailed in HPI.  All others reviewed and are negative.   Objective: Vital Signs: Ht '5\' 1"'$  (1.549 m)   Wt 210 lb (95.3 kg)   BMI 39.68 kg/m   Physical Exam well-developed well-nourished female no acute distress.  Alert oriented x3.  Ortho Exam left knee exam shows valgus deformity.  Lateral joint line tenderness.  Moderate patellofemoral crepitus.  She is neurovascular tact distally.  Specialty Comments:  No specialty comments available.  Imaging: No new imaging   PMFS History: Patient Active Problem List   Diagnosis Date Noted   Primary osteoarthritis of left knee 05/29/2021   Body mass index 40.0-44.9, adult ([place]) 05/29/2021   Morbid obesity ([place]) 05/29/2021   Acute exacerbation of chronic bronchitis ([place]) 04/19/2021   Osteoporosis 10/04/2020   DDD (degenerative disc disease), lumbar 10/04/2020   Genetic testing 08/30/2020   Family history of breast cancer 08/16/2020   Malignant neoplasm of upper-outer quadrant of right breast in female, estrogen receptor positive ([place]) 08/15/2020   COVID-19 04/16/2019   Cough 10/06/2015   S/P total knee arthroplasty 01/07/2013   Lymphadenopathy 12/14/2012   Sinusitis, acute 12/14/2012   Hyperlipidemia    Hypertension    Other idiopathic scoliosis, thoracolumbar region    Past Medical  History:  Diagnosis Date   DDD (degenerative disc disease), lumbar    Family history of breast cancer 08/16/2020   GERD (gastroesophageal reflux disease)    History of bronchitis    Hyperlipidemia    Hypertension    Osteomyelitis ([place]) 1946   both legs   Osteoporosis    Other idiopathic scoliosis, thoracolumbar region     Family History  Problem Relation Age of Onset   Asthma Mother    Asthma Sister    Heart failure Father    Breast cancer Sister        contralateral; dx 1; dx 92   Breast cancer Maternal Aunt 65   Cancer Paternal Aunt        unknown  "female" cancer; dx before 60   Throat cancer Paternal Uncle        dx after 66    Past Surgical History:  Procedure Laterality Date   ABDOMINAL HYSTERECTOMY     BREAST LUMPECTOMY Right 09/12/2020   Procedure: RIGHT BREAST LUMPECTOMY;  Surgeon: Stark Klein, MD;  Location: [place];  Service: General;  Laterality: Right;   CHOLECYSTECTOMY     JOINT REPLACEMENT     right knee   Social History   Occupational History   Occupation: Retired  Tobacco Use   Smoking status: Former    Packs/day: 0.50    Years: 5.00    Total pack years: 2.50    Types: Cigarettes    Quit date: 04/01/1980    Years since quitting: 41.8   Smokeless tobacco: Never  Vaping Use   Vaping Use: Never used  Substance and Sexual Activity   Alcohol use: No    Alcohol/week: 0.0 standard drinks of alcohol   Drug use: No   Sexual activity: Not on file

## 2022-01-22 NOTE — Telephone Encounter (Signed)
Please precert for left knee gel injection. Dr.Xu's patient.

## 2022-01-24 NOTE — Telephone Encounter (Signed)
VOB submitted for [place], left knee 

## 2022-02-12 ENCOUNTER — Other Ambulatory Visit: Payer: Self-pay | Admitting: *Deleted

## 2022-02-12 DIAGNOSIS — M79605 Pain in left leg: Secondary | ICD-10-CM

## 2022-02-19 ENCOUNTER — Telehealth: Payer: Self-pay | Admitting: Family Medicine

## 2022-02-19 ENCOUNTER — Ambulatory Visit (HOSPITAL_COMMUNITY)
Admission: RE | Admit: 2022-02-19 | Discharge: 2022-02-19 | Disposition: A | Discharge status: Home / Self Care | Payer: PPO | Source: Ambulatory Visit | Attending: Nurse Practitioner | Admitting: Nurse Practitioner

## 2022-02-19 ENCOUNTER — Encounter: Payer: Self-pay | Admitting: Vascular Surgery

## 2022-02-19 ENCOUNTER — Ambulatory Visit: Payer: PPO | Admitting: Vascular Surgery

## 2022-02-19 VITALS — BP 139/86 | HR 81 | Temp 97.6°F | Resp 14 | Ht 62.0 in | Wt 209.0 lb

## 2022-02-19 DIAGNOSIS — I872 Venous insufficiency (chronic) (peripheral): Secondary | ICD-10-CM

## 2022-02-19 DIAGNOSIS — M79605 Pain in left leg: Secondary | ICD-10-CM | POA: Insufficient documentation

## 2022-02-19 NOTE — Telephone Encounter (Signed)
Left message for patient to call back and schedule Medicare Annual Wellness Visit (AWV) in office.   If not able to come in office, please offer to do virtually or by telephone.   Last AWV:10/05/2020   Please schedule at anytime with [facility]  If any questions, please contact me at [phone].  Thank you ,  Colletta [place]

## 2022-02-19 NOTE — Progress Notes (Signed)
Patient name: Christina Lyons MRN: [id number] DOB: Jun 29, 1940 Sex: female  REASON FOR CONSULT: Evaluate left leg swelling  HPI: Christina Lyons is a 81 y.o. female, with history of hypertension and hyperlipidemia and degenerative disc disease that presents for evaluation of left leg swelling.  She has noticed significant leg swelling over the last few months and noted this to her PCP that suggested a referral here to vascular surgery.  She is not currently wearing compression stockings.  No history of left leg DVT.  She has had extensive right leg surgery on the contralateral leg for osteomyelitis including requiring a bone graft from the left leg as a child when she was 51 years old.  She is ambulatory with a cane.  She has been told to lose weight given she needs a left knee surgery.  Past Medical History:  Diagnosis Date   DDD (degenerative disc disease), lumbar    Family history of breast cancer 08/16/2020   GERD (gastroesophageal reflux disease)    History of bronchitis    Hyperlipidemia    Hypertension    Osteomyelitis ([place]) 1946   both legs   Osteoporosis    Other idiopathic scoliosis, thoracolumbar region     Past Surgical History:  Procedure Laterality Date   ABDOMINAL HYSTERECTOMY     BREAST LUMPECTOMY Right 09/12/2020   Procedure: RIGHT BREAST LUMPECTOMY;  Surgeon: Stark Klein, MD;  Location: [place];  Service: General;  Laterality: Right;   CHOLECYSTECTOMY     JOINT REPLACEMENT     right knee    Family History  Problem Relation Age of Onset   Asthma Mother    Asthma Sister    Heart failure Father    Breast cancer Sister        contralateral; dx 55; dx 63   Breast cancer Maternal Aunt 12   Cancer Paternal Aunt        unknown "female" cancer; dx before 60   Throat cancer Paternal Uncle        dx after 57    SOCIAL HISTORY: Social History   Socioeconomic History   Marital status: Widowed    Spouse name: Not on file   Number of children: Not on file   Years  of education: Not on file   Highest education level: Not on file  Occupational History   Occupation: Retired  Tobacco Use   Smoking status: Former    Packs/day: 0.50    Years: 5.00    Total pack years: 2.50    Types: Cigarettes    Quit date: 04/01/1980    Years since quitting: 41.9   Smokeless tobacco: Never  Vaping Use   Vaping Use: Never used  Substance and Sexual Activity   Alcohol use: No    Alcohol/week: 0.0 standard drinks of alcohol   Drug use: No   Sexual activity: Not on file  Other Topics Concern   Not on file  Social History Narrative   Not on file   Social Determinants of Health   Financial Resource Strain: Low Risk  (01/08/2021)   Overall Financial Resource Strain (CARDIA)    Difficulty of Paying Living Expenses: Not hard at all  Food Insecurity: No Food Insecurity (01/08/2021)   Hunger Vital Sign    Worried About Running Out of Food in the Last Year: Never true    [place] in the Last Year: Never true  Transportation Needs: No Transportation Needs (01/08/2021)   [place] - Transportation  Lack of Transportation (Medical): No    Lack of Transportation (Non-Medical): No  Physical Activity: Inactive (01/08/2021)   Exercise Vital Sign    Days of Exercise per Week: 0 days    Minutes of Exercise per Session: 0 min  Stress: No Stress Concern Present (01/08/2021)   [place]    Feeling of Stress : Not at all  Social Connections: Moderately Isolated (01/08/2021)   Social Connection and Isolation Panel [NHANES]    Frequency of Communication with Friends and Family: More than three times a week    Frequency of Social Gatherings with Friends and Family: Three times a week    Attends Religious Services: More than 4 times per year    Active Member of Clubs or Organizations: No    Attends Archivist Meetings: Never    Marital Status: Widowed  Intimate Partner Violence: Not At Risk  (01/08/2021)   Humiliation, Afraid, Rape, and Kick questionnaire    Fear of Current or Ex-Partner: No    Emotionally Abused: No    Physically Abused: No    Sexually Abused: No    Allergies  Allergen Reactions   Sulfa Antibiotics Hives   Tape Itching    Paper tape    Current Outpatient Medications  Medication Sig Dispense Refill   albuterol (VENTOLIN HFA) 108 (90 Base) MCG/ACT inhaler Inhale 1 puff into the lungs every 6 (six) hours as needed for wheezing or shortness of breath.     cetirizine (ZYRTEC) 10 MG tablet Take 10 mg by mouth at bedtime.     Cholecalciferol (VITAMIN D) 50 MCG (2000 UT) CAPS Take 4,000 Units by mouth daily.     fluticasone (FLONASE) 50 MCG/ACT nasal spray Place 1 spray into both nostrils every 12 (twelve) hours as needed for allergies.     gabapentin (NEURONTIN) 300 MG capsule Take 300 mg by mouth 2 (two) times daily.     HYDROcodone-acetaminophen (NORCO) 7.5-325 MG tablet Take 1 tablet by mouth every 6 (six) hours as needed for moderate pain. 30 tablet 0   letrozole (FEMARA) 2.5 MG tablet TAKE 1 TABLET BY MOUTH EVERY DAY 90 tablet 3   LORazepam (ATIVAN) 1 MG tablet TAKE 1 TABLET BY MOUTH 2 TIMES DAILY AS NEEDED FOR ANXIETY. 60 tablet 0   losartan (COZAAR) 50 MG tablet Take 1 tablet (50 mg total) by mouth daily. 90 tablet 1   montelukast (SINGULAIR) 10 MG tablet Take 10 mg by mouth daily.     Multiple Vitamin (MULTIVITAMIN WITH MINERALS) TABS tablet Take 1 tablet by mouth daily with supper.     omeprazole (PRILOSEC) 20 MG capsule TAKE 1 CAPSULE BY MOUTH TWICE A DAY 180 capsule 3   rosuvastatin (CRESTOR) 10 MG tablet Take 1 tablet (10 mg total) by mouth daily. 90 tablet 3   sennosides-docusate sodium (SENOKOT-S) 8.6-50 MG tablet Take 1 tablet by mouth daily as needed for constipation.     temazepam (RESTORIL) 15 MG capsule Take 15 mg by mouth at bedtime.     venlafaxine XR (EFFEXOR-XR) 37.5 MG 24 hr capsule TAKE 1 CAPSULE BY MOUTH DAILY WITH BREAKFAST. 90  capsule 2   zinc gluconate 50 MG tablet Take 50 mg by mouth daily.     aspirin EC 81 MG tablet Take 81 mg by mouth at bedtime.  (Patient not taking: Reported on 02/19/2022)     Current Facility-Administered Medications  Medication Dose Route Frequency Provider Last Rate Last Admin  denosumab (PROLIA) injection 60 mg  60 mg Subcutaneous Q6 months [name], MD   60 mg at 12/29/18 1442    REVIEW OF SYSTEMS:  '[X]'$  denotes positive finding, '[ ]'$  denotes negative finding Cardiac  Comments:  Chest pain or chest pressure:    Shortness of breath upon exertion:    Short of breath when lying flat:    Irregular heart rhythm:        Vascular    Pain in calf, thigh, or hip brought on by ambulation:    Pain in feet at night that wakes you up from your sleep:     Blood clot in your veins:    Leg swelling:  x Left       Pulmonary    Oxygen at home:    Productive cough:     Wheezing:         Neurologic    Sudden weakness in arms or legs:     Sudden numbness in arms or legs:     Sudden onset of difficulty speaking or slurred speech:    Temporary loss of vision in one eye:     Problems with dizziness:         Gastrointestinal    Blood in stool:     Vomited blood:         Genitourinary    Burning when urinating:     Blood in urine:        Psychiatric    Major depression:         Hematologic    Bleeding problems:    Problems with blood clotting too easily:        Skin    Rashes or ulcers:        Constitutional    Fever or chills:      PHYSICAL EXAM: Vitals:   02/19/22 1509  BP: 139/86  Pulse: 81  Resp: 14  Temp: 97.6 F (36.4 C)  TempSrc: Temporal  SpO2: 94%  Weight: 209 lb (94.8 kg)  Height: '5\' 2"'$  (1.575 m)    GENERAL: The patient is a well-nourished female, in no acute distress. The vital signs are documented above. CARDIAC: There is a regular rate and rhythm.  VASCULAR:  Palpable femoral pulses  Left DP palpable No notable large left leg varicosities  but evident leg swelling PULMONARY: No respiratory distress. ABDOMEN: Soft and non-tender. MUSCULOSKELETAL: There are no major deformities or cyanosis. NEUROLOGIC: No focal weakness or paresthesias are detected. SKIN: There are no ulcers or rashes noted. PSYCHIATRIC: The patient has a normal affect.  DATA:   Lower Venous Reflux Study   Patient Name:  Christina Lyons  Date of Exam:   02/19/2022  Medical Rec #: [id number]       Accession #:    [id number]  Date of Birth: [birth year]      Patient Gender: F  Patient Age:   43 years  Exam Location:  [facility]  Procedure:      VAS [place] LOWER EXTREMITY VENOUS REFLUX  Referring Phys: Monica Martinez    ---------------------------------------------------------------------------  -----    Indications: Edema.    Risk Factors: Obesity.  Performing Technologist: Elta Guadeloupe RVT, RDMS     Examination Guidelines: A complete evaluation includes B-mode imaging,  spectral  Doppler, color Doppler, and power Doppler as needed of all accessible  portions  of each vessel. Bilateral testing is considered an integral part of a  complete  examination. Limited examinations  for reoccurring indications may be  performed  as noted. The reflux portion of the exam is performed with the patient in  reverse Trendelenburg.  Significant venous reflux is defined as >500 ms in the superficial venous  system, and >1 second in the deep venous system.     +--------------+---------+------+-----------+------------+--------+  LEFT         Reflux NoRefluxReflux TimeDiameter cmsComments                          Yes                                   +--------------+---------+------+-----------+------------+--------+  CFV                    yes   >1 second                       +--------------+---------+------+-----------+------------+--------+  FV prox                 yes   >1 second                        +--------------+---------+------+-----------+------------+--------+  FV mid                        >1 second                       +--------------+---------+------+-----------+------------+--------+  FV dist       no                                              +--------------+---------+------+-----------+------------+--------+  Popliteal    no                                              +--------------+---------+------+-----------+------------+--------+  GSV at SFJ              yes    >500 ms      0.73              +--------------+---------+------+-----------+------------+--------+  GSV prox thigh          yes    >500 ms      0.39              +--------------+---------+------+-----------+------------+--------+  GSV mid thigh           yes    >500 ms      0.42              +--------------+---------+------+-----------+------------+--------+  GSV dist thighno               >500 ms      0.27              +--------------+---------+------+-----------+------------+--------+  GSV at knee   no                            0.17              +--------------+---------+------+-----------+------------+--------+  GSV prox calf no  0.17              +--------------+---------+------+-----------+------------+--------+  SSV Pop Fossa no                            0.21              +--------------+---------+------+-----------+------------+--------+  SSV prox calf           yes    >500 ms      0.17              +--------------+---------+------+-----------+------------+--------+      Summary:  Right:  - Venous reflux is noted in the right common femoral vein.    Left:  - No evidence of deep vein thrombosis seen in the left lower extremity,  from the common femoral through the popliteal veins.     - No evidence of superficial venous thrombosis in the left lower  extremity.  - Venous reflux is noted  in the left common femoral vein.  - Venous reflux is noted in the left sapheno-femoral junction.  - Venous reflux is noted in the left greater saphenous vein in the thigh.  - Venous reflux is noted in the left femoral vein.  - Venous reflux is noted in the left short saphenous vein.   Assessment/Plan:  81 year old female presents for evaluation of chronic left leg swelling.  I discussed that her reflux today shows no evidence of DVT.  She does have evidence of significant reflux with valvular insufficiency that would explain her chronic left leg swelling.  I have recommended leg elevation with weight loss and compression stockings.  We did get her sized for knee-high stockings here in the office that are medical grade compression stockings as I do not think she can wear thigh-high stockings.  We both agreed that any invasive intervention is probably best deferred given her age with more limited mobility and especially if her left leg symptoms are managed with compression stockings.   Marty Heck, MD [facility] Office: [phone]

## 2022-02-20 NOTE — Telephone Encounter (Signed)
Returned patients call left message.

## 2022-03-14 ENCOUNTER — Telehealth: Payer: Self-pay | Admitting: Family Medicine

## 2022-03-14 NOTE — Telephone Encounter (Signed)
Patient having dental work done early January; stated dentist won't do the procedure unless her pcp prescribes an antibiotic beforehand.  [facility] [address].  Please advise at [phone].

## 2022-03-14 NOTE — Telephone Encounter (Signed)
Prescription Request  03/14/2022  Is this a "Controlled Substance" medicine? Yes  LOV: Visit date not found  What is the name of the medication or equipment?   HYDROcodone-acetaminophen (NORCO) 7.5-325 MG tablet   Have you contacted your pharmacy to request a refill? Yes   Which pharmacy would you like this sent to?  [id number]: [phone]: [phone]   Patient notified that their request is being sent to the clinical staff for review and that they should receive a response within 2 business days.   Please advise patient at [phone].

## 2022-03-15 ENCOUNTER — Other Ambulatory Visit: Payer: Self-pay | Admitting: Family Medicine

## 2022-03-15 MED ORDER — HYDROCODONE-ACETAMINOPHEN 7.5-325 MG PO TABS: 1.0000 | ORAL_TABLET | Freq: Four times a day (QID) | ORAL | 0 refills | Status: DC | PRN

## 2022-03-15 MED ORDER — AMOXICILLIN 500 MG PO CAPS: 2000.0000 mg | ORAL_CAPSULE | Freq: Once | ORAL | 0 refills | Status: AC

## 2022-03-15 NOTE — Telephone Encounter (Signed)
She doesn't need to start taking the antibiotics before the procedure; she just has to have the script called into the pharmacy before the actual procedure is done. Otherwise, the dentist stated he wouldn't perform the procedure.

## 2022-04-22 ENCOUNTER — Telehealth: Payer: Self-pay | Admitting: Family Medicine

## 2022-04-22 NOTE — Telephone Encounter (Signed)
Left message for patient to call back and schedule Medicare Annual Wellness Visit (AWV) in office.   If not able to come in office, please offer to do virtually or by telephone.   Last AWV: 05/10/2021  Please schedule at any time with [facility].  30 minute appointment  Any questions, please contact me at [phone]   Thank you,   [facility]  Ambulatory Clinical Support for [facility] ??[phone] or ??[phone]

## 2022-05-02 ENCOUNTER — Other Ambulatory Visit: Payer: Self-pay | Admitting: Family Medicine

## 2022-05-02 ENCOUNTER — Telehealth: Payer: Self-pay | Admitting: Family Medicine

## 2022-05-02 MED ORDER — HYDROCODONE-ACETAMINOPHEN 7.5-325 MG PO TABS: 1.0000 | ORAL_TABLET | Freq: Four times a day (QID) | ORAL | 0 refills | Status: DC | PRN

## 2022-05-02 NOTE — Telephone Encounter (Signed)
Prescription Request  05/02/2022  Is this a "Controlled Substance" medicine? Yes  LOV: Visit date not found  What is the name of the medication or equipment? HYDROcodone-acetaminophen (NORCO) 7.5-325 MG tablet   Have you contacted your pharmacy to request a refill? No   Which pharmacy would you like this sent to?  [id number]: [phone]: [phone]   Patient notified that their request is being sent to the clinical staff for review and that they should receive a response within 2 business days.   Please advise at [phone]

## 2022-05-06 ENCOUNTER — Other Ambulatory Visit: Payer: Self-pay

## 2022-05-06 ENCOUNTER — Telehealth: Payer: Self-pay | Admitting: Family Medicine

## 2022-05-06 DIAGNOSIS — I10 Essential (primary) hypertension: Secondary | ICD-10-CM

## 2022-05-06 MED ORDER — LOSARTAN POTASSIUM 50 MG PO TABS: 50.0000 mg | ORAL_TABLET | Freq: Every day | ORAL | 0 refills | Status: DC

## 2022-05-06 NOTE — Telephone Encounter (Signed)
Prescription Request  05/06/2022  Is this a "Controlled Substance" medicine? No  LOV: Visit date not found  What is the name of the medication or equipment? losartan (COZAAR) 50 MG tablet   Have you contacted your pharmacy to request a refill? Yes   Which pharmacy would you like this sent to?  [id number]Phone: [phone]: [phone]   Patient notified that their request is being sent to the clinical staff for review and that they should receive a response within 2 business days.   Please advise at [phone]

## 2022-05-30 ENCOUNTER — Ambulatory Visit (INDEPENDENT_AMBULATORY_CARE_PROVIDER_SITE_OTHER): Payer: PPO

## 2022-05-30 VITALS — Ht 62.0 in | Wt 209.0 lb

## 2022-05-30 DIAGNOSIS — Z0000 Encounter for general adult medical examination without abnormal findings: Secondary | ICD-10-CM

## 2022-05-30 NOTE — Progress Notes (Signed)
Subjective:   Christina Lyons is a 82 y.o. female who presents for Medicare Annual (Subsequent) preventive examination.  I connected with  [name] on 05/30/22 by a audio enabled telemedicine application and verified that I am speaking with the correct person using two identifiers.  Patient Location: Home  Provider Location: Office/Clinic  I discussed the limitations of evaluation and management by telemedicine. The patient expressed understanding and agreed to proceed.  Review of Systems     Cardiac Risk Factors include: advanced age (>27mn, >>66women);dyslipidemia;hypertension     Objective:    Today's Vitals   05/30/22 1442  Weight: 209 lb (94.8 kg)  Height: '5\' 2"'$  (1.575 m)   Body mass index is 38.23 kg/m.     05/30/2022    7:25 PM 10/05/2020    2:56 PM 09/06/2020    2:39 PM 04/17/2019    2:00 AM  Advanced Directives  Does Patient Have a Medical Advance Directive? Yes Yes Yes Yes  Type of Advance Directive Living will;Healthcare [place] will Living will  Does patient want to make changes to medical advance directive? No - Patient declined No - Patient declined No - Patient declined No - Patient declined  Copy of HGertyin Chart? No - copy requested No - copy requested      Current Medications (verified) Outpatient Encounter Medications as of 05/30/2022  Medication Sig   Cholecalciferol (VITAMIN D) 50 MCG (2000 UT) CAPS Take 4,000 Units by mouth daily.   gabapentin (NEURONTIN) 300 MG capsule Take 300 mg by mouth 2 (two) times daily.   HYDROcodone-acetaminophen (NORCO) 7.5-325 MG tablet Take 1 tablet by mouth every 6 (six) hours as needed for moderate pain.   losartan (COZAAR) 50 MG tablet Take 1 tablet (50 mg total) by mouth daily.   omeprazole (PRILOSEC) 20 MG capsule TAKE 1 CAPSULE BY MOUTH TWICE A DAY   rosuvastatin (CRESTOR) 10 MG tablet Take 1 tablet (10 mg total) by mouth daily.    venlafaxine XR (EFFEXOR-XR) 37.5 MG 24 hr capsule TAKE 1 CAPSULE BY MOUTH DAILY WITH BREAKFAST.   albuterol (VENTOLIN HFA) 108 (90 Base) MCG/ACT inhaler Inhale 1 puff into the lungs every 6 (six) hours as needed for wheezing or shortness of breath. (Patient not taking: Reported on 05/30/2022)   aspirin EC 81 MG tablet Take 81 mg by mouth at bedtime.  (Patient not taking: Reported on 02/19/2022)   cetirizine (ZYRTEC) 10 MG tablet Take 10 mg by mouth at bedtime. (Patient not taking: Reported on 05/30/2022)   fluticasone (FLONASE) 50 MCG/ACT nasal spray Place 1 spray into both nostrils every 12 (twelve) hours as needed for allergies. (Patient not taking: Reported on 05/30/2022)   letrozole (FEMARA) 2.5 MG tablet TAKE 1 TABLET BY MOUTH EVERY DAY (Patient not taking: Reported on 05/30/2022)   LORazepam (ATIVAN) 1 MG tablet TAKE 1 TABLET BY MOUTH 2 TIMES DAILY AS NEEDED FOR ANXIETY. (Patient not taking: Reported on 05/30/2022)   montelukast (SINGULAIR) 10 MG tablet Take 10 mg by mouth daily. (Patient not taking: Reported on 05/30/2022)   Multiple Vitamin (MULTIVITAMIN WITH MINERALS) TABS tablet Take 1 tablet by mouth daily with supper. (Patient not taking: Reported on 05/30/2022)   sennosides-docusate sodium (SENOKOT-S) 8.6-50 MG tablet Take 1 tablet by mouth daily as needed for constipation. (Patient not taking: Reported on 05/30/2022)   temazepam (RESTORIL) 15 MG capsule Take 15 mg by mouth at bedtime. (Patient not taking:  Reported on 05/30/2022)   zinc gluconate 50 MG tablet Take 50 mg by mouth daily. (Patient not taking: Reported on 05/30/2022)   Facility-Administered Encounter Medications as of 05/30/2022  Medication   denosumab (PROLIA) injection 60 mg    Allergies (verified) Sulfa antibiotics and Tape   History: Past Medical History:  Diagnosis Date   DDD (degenerative disc disease), lumbar    Family history of breast cancer 08/16/2020   GERD (gastroesophageal reflux disease)    History of  bronchitis    Hyperlipidemia    Hypertension    Osteomyelitis ([place]) 1946   both legs   Osteoporosis    Other idiopathic scoliosis, thoracolumbar region    Past Surgical History:  Procedure Laterality Date   ABDOMINAL HYSTERECTOMY     BREAST LUMPECTOMY Right 09/12/2020   Procedure: RIGHT BREAST LUMPECTOMY;  Surgeon: Stark Klein, MD;  Location: [facility] OR;  Service: General;  Laterality: Right;   CHOLECYSTECTOMY     JOINT REPLACEMENT     right knee   Family History  Problem Relation Age of Onset   Asthma Mother    Asthma Sister    Heart failure Father    Breast cancer Sister        contralateral; dx 80; dx 63   Breast cancer Maternal Aunt 46   Cancer Paternal Aunt        unknown "female" cancer; dx before 60   Throat cancer Paternal Uncle        dx after 36   Social History   Socioeconomic History   Marital status: Widowed    Spouse name: Not on file   Number of children: Not on file   Years of education: Not on file   Highest education level: Not on file  Occupational History   Occupation: Retired  Tobacco Use   Smoking status: Former    Packs/day: 0.50    Years: 5.00    Total pack years: 2.50    Types: Cigarettes    Quit date: 04/01/1980    Years since quitting: 42.1   Smokeless tobacco: Never  Vaping Use   Vaping Use: Never used  Substance and Sexual Activity   Alcohol use: No    Alcohol/week: 0.0 standard drinks of alcohol   Drug use: No   Sexual activity: Not on file  Other Topics Concern   Not on file  Social History Narrative   Not on file   Social Determinants of Health   Financial Resource Strain: Low Risk  (05/30/2022)   Overall Financial Resource Strain (CARDIA)    Difficulty of Paying Living Expenses: Not hard at all  Food Insecurity: No Food Insecurity (05/30/2022)   Hunger Vital Sign    Worried About Running Out of Food in the Last Year: Never true    [place] in the Last Year: Never true  Transportation Needs: No Transportation Needs  (05/30/2022)   PRAPARE - Hydrologist (Medical): No    Lack of Transportation (Non-Medical): No  Physical Activity: Inactive (05/30/2022)   Exercise Vital Sign    Days of Exercise per Week: 0 days    Minutes of Exercise per Session: 0 min  Stress: No Stress Concern Present (05/30/2022)   [place]    Feeling of Stress : Not at all  Social Connections: Moderately Isolated (05/30/2022)   Social Connection and Isolation Panel [NHANES]    Frequency of Communication with Friends and Family:  More than three times a week    Frequency of Social Gatherings with Friends and Family: Three times a week    Attends Religious Services: More than 4 times per year    Active Member of Clubs or Organizations: No    Attends Archivist Meetings: Never    Marital Status: Widowed    Tobacco Counseling Counseling given: Not Answered   Clinical Intake:  Pre-visit preparation completed: Yes  Pain : No/denies pain  Diabetes: No  How often do you need to have someone help you when you read instructions, pamphlets, or other written materials from your doctor or pharmacy?: 1 - Never  Diabetic?No   Interpreter Needed?: No  Information entered by :: Denman George LPN   Activities of Daily Living    05/30/2022    7:25 PM  In your present state of health, do you have any difficulty performing the following activities:  Hearing? 0  Vision? 0  Difficulty concentrating or making decisions? 0  Walking or climbing stairs? 0  Dressing or bathing? 0  Doing errands, shopping? 0  Preparing Food and eating ? N  Using the Toilet? N  In the past six months, have you accidently leaked urine? N  Do you have problems with loss of bowel control? N  Managing your Medications? N  Managing your Finances? N  Housekeeping or managing your Housekeeping? N    Patient Care Team: [name], MD as PCP -  General (Family Medicine) Stark Klein, MD as Consulting Physician (General Surgery) Nicholas Lose, MD as Consulting Physician (Hematology and Oncology) Eppie Gibson, MD as Attending Physician (Radiation Oncology) Delice Bison Charlestine Massed, NP as Nurse Practitioner (Hematology and Oncology) Harmon Pier, RN as Registered Nurse Melissa Noon, [place] as Referring Physician (Optometry) Leandrew Koyanagi, MD as Attending Physician (Orthopedic Surgery) Marty Heck, MD as Consulting Physician (Vascular Surgery)  Indicate any recent Medical Services you may have received from other than [facility] providers in the past year (date may be approximate).     Assessment:   This is a routine wellness examination for Adell.  Hearing/Vision screen Hearing Screening - Comments:: Denies hearing difficulties  Vision Screening - Comments:: Wears rx glasses - up to date with routine eye exams with Dr. Delman Cheadle    Dietary issues and exercise activities discussed: Current Exercise Habits: The patient does not participate in regular exercise at present   Goals Addressed   None    Depression Screen    05/30/2022    7:23 PM 01/08/2021    3:08 PM 10/05/2020    2:59 PM 06/08/2020   12:33 PM 05/22/2017    8:58 AM 05/22/2017    8:32 AM 01/02/2017    3:49 PM  PHQ 2/9 Scores  PHQ - 2 Score 0 0 0 0 0 0 0    Fall Risk    05/30/2022    2:56 PM 10/05/2020    2:57 PM 06/08/2020   12:32 PM 04/28/2020    9:38 AM 10/29/2018    1:56 PM  Fall Risk   Falls in the past year? '1 1 1 '$ 0 0  Comment     Emmi Telephone Survey: data to providers prior to load  Number falls in past yr: 1 1 0 0   Injury with Fall? 0 0 1 0   Risk for fall due to : History of fall(s);Impaired mobility Impaired balance/gait Impaired balance/gait No Fall Risks   Follow up Education provided;Falls prevention discussed;Falls evaluation completed Falls  evaluation completed;Falls prevention discussed Falls evaluation completed Falls evaluation completed      FALL RISK PREVENTION PERTAINING TO THE HOME:  Any stairs in or around the home? No  If so, are there any without handrails? No  Home free of loose throw rugs in walkways, pet beds, electrical cords, etc? Yes  Adequate lighting in your home to reduce risk of falls? Yes   ASSISTIVE DEVICES UTILIZED TO PREVENT FALLS:  Life alert? No  Use of a cane, walker or w/c? Yes  Grab bars in the bathroom? Yes  Shower chair or bench in shower? No  Elevated toilet seat or a handicapped toilet? Yes   TIMED UP AND GO:  Was the test performed? No . Telephonic visit   Cognitive Function:        05/30/2022    7:27 PM  6CIT Screen  What Year? 0 points  What month? 0 points  What time? 0 points  Count back from 20 0 points  Months in reverse 0 points  Repeat phrase 0 points  Total Score 0 points    Immunizations Immunization History  Administered Date(s) Administered   Fluad Quad(high Dose 65+) 12/29/2018   Influenza, High Dose Seasonal PF 01/05/2018   Influenza,inj,Quad PF,6+ Mos 01/17/2014, 01/11/2015   Influenza-Unspecified 03/05/2011, 12/30/2011, 01/30/2013, 02/08/2016, 05/01/2017, 03/29/2020   Pneumococcal Conjugate-13 03/01/2014   Pneumococcal Polysaccharide-23 12/10/2010    TDAP status: Due, Education has been provided regarding the importance of this vaccine. Advised may receive this vaccine at local pharmacy or Health Dept. Aware to provide a copy of the vaccination record if obtained from local pharmacy or Health Dept. Verbalized acceptance and understanding.  Flu Vaccine status: Declined, Education has been provided regarding the importance of this vaccine but patient still declined. Advised may receive this vaccine at local pharmacy or Health Dept. Aware to provide a copy of the vaccination record if obtained from local pharmacy or Health Dept. Verbalized acceptance and understanding.  Pneumococcal vaccine status: Up to date  Covid-19 vaccine status: Information provided  on how to obtain vaccines.   Qualifies for Shingles Vaccine? Yes   Zostavax completed No   Shingrix Completed?: No.    Education has been provided regarding the importance of this vaccine. Patient has been advised to call insurance company to determine out of pocket expense if they have not yet received this vaccine. Advised may also receive vaccine at local pharmacy or Health Dept. Verbalized acceptance and understanding.  Screening Tests Health Maintenance  Topic Date Due   COVID-19 Vaccine (1) Never done   Hepatitis C Screening  Never done   DTaP/Tdap/Td (1 - Tdap) Never done   Zoster Vaccines- Shingrix (1 of 2) Never done   INFLUENZA VACCINE  06/30/2022 (Originally 10/30/2021)   Medicare Annual Wellness (AWV)  05/30/2023   Pneumonia Vaccine 24+ Years old  Completed   DEXA SCAN  Completed   HPV VACCINES  Aged Out   COLONOSCOPY (Pts 45-78yr Insurance coverage will need to be confirmed)  Discontinued    Health Maintenance  Health Maintenance Due  Topic Date Due   COVID-19 Vaccine (1) Never done   Hepatitis C Screening  Never done   DTaP/Tdap/Td (1 - Tdap) Never done   Zoster Vaccines- Shingrix (1 of 2) Never done    Colorectal cancer screening: No longer required.   Mammogram status: No longer required due to age.  Bone Density status: Completed 04/06/21. Results reflect: Bone density results: OSTEOPOROSIS. Repeat every 2 years.  Lung Cancer Screening: (  Low Dose CT Chest recommended if Age 35-80 years, 30 pack-year currently smoking OR have quit w/in 15years.) does not qualify.   Lung Cancer Screening Referral: n/a  Additional Screening:  Hepatitis C Screening: does qualify  Vision Screening: Recommended annual ophthalmology exams for early detection of glaucoma and other disorders of the eye. Is the patient up to date with their annual eye exam?  Yes  Who is the provider or what is the name of the office in which the patient attends annual eye exams? Dr. Delman Cheadle  If pt is  not established with a provider, would they like to be referred to a provider to establish care? No .   Dental Screening: Recommended annual dental exams for proper oral hygiene  Community Resource Referral / Chronic Care Management: CRR required this visit?  No   CCM required this visit?  No      Plan:     I have personally reviewed and noted the following in the patient's chart:   Medical and social history Use of alcohol, tobacco or illicit drugs  Current medications and supplements including opioid prescriptions. Patient is currently taking opioid prescriptions. Information provided to patient regarding non-opioid alternatives. Patient advised to discuss non-opioid treatment plan with their provider. Functional ability and status Nutritional status Physical activity Advanced directives List of other physicians Hospitalizations, surgeries, and ER visits in previous 12 months Vitals Screenings to include cognitive, depression, and falls Referrals and appointments  In addition, I have reviewed and discussed with patient certain preventive protocols, quality metrics, and best practice recommendations. A written personalized care plan for preventive services as well as general preventive health recommendations were provided to patient.     Vanetta Mulders, [place]   [id number]   Due to this being a virtual visit, the after visit summary with patients personalized plan was offered to patient via mail or my-chart.  per request, patient was mailed a copy of AVS.  Nurse Notes: No concerns

## 2022-05-30 NOTE — Patient Instructions (Signed)
Christina Lyons , Thank you for taking time to come for your Medicare Wellness Visit. I appreciate your ongoing commitment to your health goals. Please review the following plan we discussed and let me know if I can assist you in the future.   These are the goals we discussed:  Goals      Prevent falls        This is a list of the screening recommended for you and due dates:  Health Maintenance  Topic Date Due   COVID-19 Vaccine (1) Never done   Hepatitis C Screening: USPSTF Recommendation to screen - Ages 86-79 yo.  Never done   DTaP/Tdap/Td vaccine (1 - Tdap) Never done   Zoster (Shingles) Vaccine (1 of 2) Never done   Flu Shot  10/30/2021   Medicare Annual Wellness Visit  05/30/2023   Pneumonia Vaccine  Completed   DEXA scan (bone density measurement)  Completed   HPV Vaccine  Aged Out   Colon Cancer Screening  Discontinued    Advanced directives: Please bring a copy of your health care power of attorney and living will to the office to be added to your chart at your convenience.   Conditions/risks identified: Aim for 30 minutes of exercise or brisk walking, 6-8 glasses of water, and 5 servings of fruits and vegetables each day.   Next appointment: Follow up in one year for your annual wellness visit    Preventive Care 65 Years and Older, Female Preventive care refers to lifestyle choices and visits with your health care provider that can promote health and wellness. What does preventive care include? A yearly physical exam. This is also called an annual well check. Dental exams once or twice a year. Routine eye exams. Ask your health care provider how often you should have your eyes checked. Personal lifestyle choices, including: Daily care of your teeth and gums. Regular physical activity. Eating a healthy diet. Avoiding tobacco and drug use. Limiting alcohol use. Practicing safe sex. Taking low-dose aspirin every day. Taking vitamin and mineral supplements as  recommended by your health care provider. What happens during an annual well check? The services and screenings done by your health care provider during your annual well check will depend on your age, overall health, lifestyle risk factors, and family history of disease. Counseling  Your health care provider may ask you questions about your: Alcohol use. Tobacco use. Drug use. Emotional well-being. Home and relationship well-being. Sexual activity. Eating habits. History of falls. Memory and ability to understand (cognition). Work and work Statistician. Reproductive health. Screening  You may have the following tests or measurements: Height, weight, and BMI. Blood pressure. Lipid and cholesterol levels. These may be checked every 5 years, or more frequently if you are over 39 years old. Skin check. Lung cancer screening. You may have this screening every year starting at age 8 if you have a 30-pack-year history of smoking and currently smoke or have quit within the past 15 years. Fecal occult blood test (FOBT) of the stool. You may have this test every year starting at age 36. Flexible sigmoidoscopy or colonoscopy. You may have a sigmoidoscopy every 5 years or a colonoscopy every 10 years starting at age 47. Hepatitis C blood test. Hepatitis B blood test. Sexually transmitted disease (STD) testing. Diabetes screening. This is done by checking your blood sugar (glucose) after you have not eaten for a while (fasting). You may have this done every 1-3 years. Bone density scan. This is done to  screen for osteoporosis. You may have this done starting at age 71. Mammogram. This may be done every 1-2 years. Talk to your health care provider about how often you should have regular mammograms. Talk with your health care provider about your test results, treatment options, and if necessary, the need for more tests. Vaccines  Your health care provider may recommend certain vaccines, such  as: Influenza vaccine. This is recommended every year. Tetanus, diphtheria, and acellular pertussis (Tdap, Td) vaccine. You may need a Td booster every 10 years. Zoster vaccine. You may need this after age 59. Pneumococcal 13-valent conjugate (PCV13) vaccine. One dose is recommended after age 38. Pneumococcal polysaccharide (PPSV23) vaccine. One dose is recommended after age 68. Talk to your health care provider about which screenings and vaccines you need and how often you need them. This information is not intended to replace advice given to you by your health care provider. Make sure you discuss any questions you have with your health care provider. Document Released: 04/14/2015 Document Revised: 12/06/2015 Document Reviewed: 01/17/2015 Elsevier Interactive Patient Education  2017 [place] Prevention in the Home Falls can cause injuries. They can happen to people of all ages. There are many things you can do to make your home safe and to help prevent falls. What can I do on the outside of my home? Regularly fix the edges of walkways and driveways and fix any cracks. Remove anything that might make you trip as you walk through a door, such as a raised step or threshold. Trim any bushes or trees on the path to your home. Use bright outdoor lighting. Clear any walking paths of anything that might make someone trip, such as rocks or tools. Regularly check to see if handrails are loose or broken. Make sure that both sides of any steps have handrails. Any raised decks and porches should have guardrails on the edges. Have any leaves, snow, or ice cleared regularly. Use sand or salt on walking paths during winter. Clean up any spills in your garage right away. This includes oil or grease spills. What can I do in the bathroom? Use night lights. Install grab bars by the toilet and in the tub and shower. Do not use towel bars as grab bars. Use non-skid mats or decals in the tub or  shower. If you need to sit down in the shower, use a plastic, non-slip stool. Keep the floor dry. Clean up any water that spills on the floor as soon as it happens. Remove soap buildup in the tub or shower regularly. Attach bath mats securely with double-sided non-slip rug tape. Do not have throw rugs and other things on the floor that can make you trip. What can I do in the bedroom? Use night lights. Make sure that you have a light by your bed that is easy to reach. Do not use any sheets or blankets that are too big for your bed. They should not hang down onto the floor. Have a firm chair that has side arms. You can use this for support while you get dressed. Do not have throw rugs and other things on the floor that can make you trip. What can I do in the kitchen? Clean up any spills right away. Avoid walking on wet floors. Keep items that you use a lot in easy-to-reach places. If you need to reach something above you, use a strong step stool that has a grab bar. Keep electrical cords out of the way.  Do not use floor polish or wax that makes floors slippery. If you must use wax, use non-skid floor wax. Do not have throw rugs and other things on the floor that can make you trip. What can I do with my stairs? Do not leave any items on the stairs. Make sure that there are handrails on both sides of the stairs and use them. Fix handrails that are broken or loose. Make sure that handrails are as long as the stairways. Check any carpeting to make sure that it is firmly attached to the stairs. Fix any carpet that is loose or worn. Avoid having throw rugs at the top or bottom of the stairs. If you do have throw rugs, attach them to the floor with carpet tape. Make sure that you have a light switch at the top of the stairs and the bottom of the stairs. If you do not have them, ask someone to add them for you. What else can I do to help prevent falls? Wear shoes that: Do not have high heels. Have  rubber bottoms. Are comfortable and fit you well. Are closed at the toe. Do not wear sandals. If you use a stepladder: Make sure that it is fully opened. Do not climb a closed stepladder. Make sure that both sides of the stepladder are locked into place. Ask someone to hold it for you, if possible. Clearly mark and make sure that you can see: Any grab bars or handrails. First and last steps. Where the edge of each step is. Use tools that help you move around (mobility aids) if they are needed. These include: Canes. Walkers. Scooters. Crutches. Turn on the lights when you go into a dark area. Replace any light bulbs as soon as they burn out. Set up your furniture so you have a clear path. Avoid moving your furniture around. If any of your floors are uneven, fix them. If there are any pets around you, be aware of where they are. Review your medicines with your doctor. Some medicines can make you feel dizzy. This can increase your chance of falling. Ask your doctor what other things that you can do to help prevent falls. This information is not intended to replace advice given to you by your health care provider. Make sure you discuss any questions you have with your health care provider. Document Released: 01/12/2009 Document Revised: 08/24/2015 Document Reviewed: 04/22/2014 Elsevier Interactive Patient Education  2017 [employer].

## 2022-07-07 ENCOUNTER — Other Ambulatory Visit: Payer: Self-pay | Admitting: Family Medicine

## 2022-07-08 NOTE — Telephone Encounter (Signed)
Patient need an appt.

## 2022-07-08 NOTE — Telephone Encounter (Signed)
Courtesy refill given, appointment needed.   Requested Prescriptions  Pending Prescriptions Disp Refills   omeprazole (PRILOSEC) 20 MG capsule [Pharmacy Med Name: OMEPRAZOLE DR 20 MG CAPSULE] 60 capsule 0    Sig: Take 1 capsule (20 mg total) by mouth 2 (two) times daily. OFFICE VISIT NEEDED FOR ADDITIONAL REFILLS     Gastroenterology: Proton Pump Inhibitors Failed - 07/07/2022  1:18 AM      Failed - Valid encounter within last 12 months    Recent Outpatient Visits           1 year ago Acute exacerbation of chronic bronchitis (HCC)   [facility] Medicine [name], NP   1 year ago Chronic pain of left knee   [facility] Family Medicine Donita Brooks, MD   2 years ago Chronic pain of left knee   [facility] Donita Brooks, MD   2 years ago Herpes zoster without complication   [facility] Medicine Tanya Nones Priscille Heidelberg, MD   2 years ago Pure hypercholesterolemia   [facility] Family Medicine Pickard, Priscille Heidelberg, MD

## 2022-07-08 NOTE — Telephone Encounter (Signed)
Patient called and advised she has not been seen in a year, last seen by Cathlean Marseilles, NP and if she is wanting to remain a patient, she will need to schedule a physical with Dr. Tanya Nones. She says yes she would like to remain a patient and will do better with scheduling appointments. I advised I will have the someone to call her tomorrow to schedule and if she doesn't hear from anyone to call the office tomorrow after lunch to schedule a physical, she verbalized understanding.

## 2022-07-09 ENCOUNTER — Telehealth: Payer: Self-pay

## 2022-07-09 ENCOUNTER — Other Ambulatory Visit: Payer: Self-pay | Admitting: Hematology and Oncology

## 2022-08-02 ENCOUNTER — Other Ambulatory Visit: Payer: Self-pay | Admitting: Family Medicine

## 2022-08-09 ENCOUNTER — Other Ambulatory Visit: Payer: PPO

## 2022-08-09 ENCOUNTER — Other Ambulatory Visit: Payer: Self-pay

## 2022-08-09 DIAGNOSIS — E7800 Pure hypercholesterolemia, unspecified: Secondary | ICD-10-CM

## 2022-08-09 DIAGNOSIS — I10 Essential (primary) hypertension: Secondary | ICD-10-CM

## 2022-08-09 LAB — CBC WITH DIFFERENTIAL/PLATELET
Absolute Monocytes: 451 cells/uL (ref 200–950)
Basophils Absolute: 52 cells/uL (ref 0–200)
Eosinophils Relative: 4.7 %
HCT: 39.2 % (ref 35.0–45.0)
MCH: 33.3 pg — ABNORMAL HIGH (ref 27.0–33.0)
MPV: 10.3 fL (ref 7.5–12.5)
Neutro Abs: 2209 cells/uL (ref 1500–7800)
Neutrophils Relative %: 47 %
RBC: 3.9 10*6/uL (ref 3.80–5.10)

## 2022-08-09 MED ORDER — HYDROCODONE-ACETAMINOPHEN 7.5-325 MG PO TABS: 1.0000 | ORAL_TABLET | Freq: Four times a day (QID) | ORAL | 0 refills | Status: DC | PRN

## 2022-08-10 LAB — LIPID PANEL
Cholesterol: 147 mg/dL (ref <200)
HDL: 74 mg/dL (ref >=50)
LDL Cholesterol (Calc): 59 mg/dL (calc)
Non-HDL Cholesterol (Calc): 73 mg/dL (calc) (ref <130)
Total CHOL/HDL Ratio: 2 (calc) (ref <5.0)
Triglycerides: 64 mg/dL (ref <150)

## 2022-08-10 LAB — THYROID PANEL WITH TSH
Free Thyroxine Index: 2 (ref 1.4–3.8)
T3 Uptake: 29 % (ref 22–35)
T4, Total: 6.8 ug/dL (ref 5.1–11.9)
TSH: 6.89 mIU/L — ABNORMAL HIGH (ref 0.40–4.50)

## 2022-08-10 LAB — CBC WITH DIFFERENTIAL/PLATELET
Basophils Relative: 1.1 %
Eosinophils Absolute: 221 cells/uL (ref 15–500)
Hemoglobin: 13 g/dL (ref 11.7–15.5)
Lymphs Abs: 1767 cells/uL (ref 850–3900)
MCHC: 33.2 g/dL (ref 32.0–36.0)
MCV: 100.5 fL — ABNORMAL HIGH (ref 80.0–100.0)
Monocytes Relative: 9.6 %
Platelets: 146 10*3/uL (ref 140–400)
RDW: 11.9 % (ref 11.0–15.0)
Total Lymphocyte: 37.6 %
WBC: 4.7 10*3/uL (ref 3.8–10.8)

## 2022-08-10 LAB — COMPLETE METABOLIC PANEL WITH GFR
AG Ratio: 2.1 (calc) (ref 1.0–2.5)
ALT: 8 U/L (ref 6–29)
AST: 12 U/L (ref 10–35)
Albumin: 3.9 g/dL (ref 3.6–5.1)
Alkaline phosphatase (APISO): 63 U/L (ref 37–153)
BUN/Creatinine Ratio: 25 (calc) — ABNORMAL HIGH (ref 6–22)
BUN: 15 mg/dL (ref 7–25)
CO2: 29 mmol/L (ref 20–32)
Calcium: 8.7 mg/dL (ref 8.6–10.4)
Chloride: 107 mmol/L (ref 98–110)
Creat: 0.59 mg/dL — ABNORMAL LOW (ref 0.60–1.00)
Globulin: 1.9 g/dL (calc) (ref 1.9–3.7)
Glucose, Bld: 89 mg/dL (ref 65–99)
Potassium: 3.8 mmol/L (ref 3.5–5.3)
Sodium: 144 mmol/L (ref 135–146)
Total Bilirubin: 0.5 mg/dL (ref 0.2–1.2)
Total Protein: 5.8 g/dL — ABNORMAL LOW (ref 6.1–8.1)
eGFR: 92 mL/min/{1.73_m2} (ref >=60)

## 2022-08-16 ENCOUNTER — Other Ambulatory Visit: Payer: Self-pay | Admitting: Family Medicine

## 2022-08-16 NOTE — Telephone Encounter (Signed)
Requested Prescriptions  Refused Prescriptions Disp Refills   omeprazole (PRILOSEC) 20 MG capsule [Pharmacy Med Name: OMEPRAZOLE DR 20 MG CAPSULE] 60 capsule 0    Sig: TAKE 1 CAPSULE BY MOUTH 2 (TWO) TIMES DAILY. OFFICE VISIT NEEDED FOR ADDITIONAL REFILLS     Gastroenterology: Proton Pump Inhibitors Failed - 08/16/2022 11:59 AM      Failed - Valid encounter within last 12 months    Recent Outpatient Visits           1 year ago Acute exacerbation of chronic bronchitis (HCC)   [facility] Medicine [name], NP   1 year ago Chronic pain of left knee   [facility] Medicine Donita Brooks, MD   2 years ago Chronic pain of left knee   [facility] Family Medicine Donita Brooks, MD   2 years ago Herpes zoster without complication   [facility] Medicine Tanya Nones Priscille Heidelberg, MD   2 years ago Pure hypercholesterolemia   [facility] Family Medicine Pickard, Priscille Heidelberg, MD       Future Appointments             In 4 days Pickard, Priscille Heidelberg, MD [facility], [facility]

## 2022-08-20 ENCOUNTER — Encounter: Payer: PPO | Admitting: Family Medicine

## 2022-09-04 ENCOUNTER — Other Ambulatory Visit: Payer: Self-pay | Admitting: Adult Health

## 2022-09-12 ENCOUNTER — Other Ambulatory Visit: Payer: Self-pay | Admitting: Family Medicine

## 2022-09-12 DIAGNOSIS — I10 Essential (primary) hypertension: Secondary | ICD-10-CM

## 2022-09-15 ENCOUNTER — Other Ambulatory Visit: Payer: Self-pay | Admitting: Family Medicine

## 2022-09-15 DIAGNOSIS — I10 Essential (primary) hypertension: Secondary | ICD-10-CM

## 2022-09-16 NOTE — Telephone Encounter (Signed)
Requested medication (s) are due for refill today: yes  Requested medication (s) are on the active medication list: yes  Last refill:  multiple dates  Future visit scheduled: yes  Notes to clinic:  Unable to refill per protocol, courtesy refill already given, routing for provider approval.      Requested Prescriptions  Pending Prescriptions Disp Refills   omeprazole (PRILOSEC) 20 MG capsule [Pharmacy Med Name: OMEPRAZOLE DR 20 MG CAPSULE] 60 capsule 0    Sig: TAKE 1 CAPSULE BY MOUTH 2 (TWO) TIMES DAILY. OFFICE VISIT NEEDED FOR ADDITIONAL REFILLS     Gastroenterology: Proton Pump Inhibitors Failed - 09/15/2022  6:58 PM      Failed - Valid encounter within last 12 months    Recent Outpatient Visits           1 year ago Acute exacerbation of chronic bronchitis (HCC)   [facility] Medicine [name], NP   1 year ago Chronic pain of left knee   [facility] Medicine Tanya Nones, Priscille Heidelberg, MD   2 years ago Chronic pain of left knee   [facility] Family Medicine Donita Brooks, MD   2 years ago Herpes zoster without complication   [facility] Medicine Pickard, Priscille Heidelberg, MD   2 years ago Pure hypercholesterolemia   [facility] Family Medicine Pickard, Priscille Heidelberg, MD       Future Appointments             In 3 weeks Pickard, Priscille Heidelberg, MD [facility], [facility]             losartan (COZAAR) 50 MG tablet [Pharmacy Med Name: LOSARTAN POTASSIUM 50 MG TAB] 30 tablet 0    Sig: TAKE 1 TABLET BY MOUTH EVERY DAY     Cardiovascular:  Angiotensin Receptor Blockers Failed - 09/15/2022  6:58 PM      Failed - Cr in normal range and within 180 days    Creat  Date Value Ref Range Status  08/09/2022 0.59 (L) 0.60 - 1.00 mg/dL Final         Failed - Valid encounter within last 6 months    Recent Outpatient Visits           1 year ago Acute exacerbation of chronic bronchitis (HCC)   [facility] Medicine [name],  NP   1 year ago Chronic pain of left knee   [facility] Medicine Donita Brooks, MD   2 years ago Chronic pain of left knee   [facility] Donita Brooks, MD   2 years ago Herpes zoster without complication   [facility] Medicine Pickard, Priscille Heidelberg, MD   2 years ago Pure hypercholesterolemia   [facility] Family Medicine Donita Brooks, MD       Future Appointments             In 3 weeks Tanya Nones, Priscille Heidelberg, MD [facility] Medicine, [facility]            Passed - K in normal range and within 180 days    Potassium  Date Value Ref Range Status  08/09/2022 3.8 3.5 - 5.3 mmol/L Final         Passed - Patient is not pregnant      Passed - Last BP in normal range    BP Readings from Last 1 Encounters:  02/19/22 139/86

## 2022-09-23 ENCOUNTER — Other Ambulatory Visit: Payer: Self-pay | Admitting: Family Medicine

## 2022-09-23 DIAGNOSIS — I10 Essential (primary) hypertension: Secondary | ICD-10-CM

## 2022-09-24 ENCOUNTER — Telehealth: Payer: Self-pay

## 2022-09-24 NOTE — Telephone Encounter (Signed)
Pt called in to request a refill of this med please:  rosuvastatin (CRESTOR) 10 MG tablet  LOV: 03/12/21  UPCOMING CPE 10/08/22  PHARMACY: [id number] [place], [place] - [address] [place] [address] Phone: [phone]  Fax: [phone]   CB#: [phone]

## 2022-09-25 ENCOUNTER — Other Ambulatory Visit: Payer: Self-pay

## 2022-09-25 DIAGNOSIS — E7800 Pure hypercholesterolemia, unspecified: Secondary | ICD-10-CM

## 2022-09-25 MED ORDER — ROSUVASTATIN CALCIUM 10 MG PO TABS
10.0000 mg | ORAL_TABLET | Freq: Every day | ORAL | 1 refills | Status: DC
Start: 2022-09-25 — End: 2023-01-31

## 2022-09-26 ENCOUNTER — Other Ambulatory Visit: Payer: Self-pay | Admitting: Family Medicine

## 2022-09-26 DIAGNOSIS — I10 Essential (primary) hypertension: Secondary | ICD-10-CM

## 2022-09-26 NOTE — Telephone Encounter (Signed)
Prescription Request LOV: 04/19/2021 Patient states she is now completely out  What is the name of the medication or equipment? losartan (COZAAR) 50 MG tablet   Have you contacted your pharmacy to request a refill? Yes   Which pharmacy would you like this sent to?  [id number] [place], [place] - [address] [place] [address] Phone: [phone] Fax: [phone]    Patient notified that their request is being sent to the clinical staff for review and that they should receive a response within 2 business days.   Please advise at [facility] [phone]

## 2022-09-27 MED ORDER — LOSARTAN POTASSIUM 50 MG PO TABS
50.0000 mg | ORAL_TABLET | Freq: Every day | ORAL | 0 refills | Status: DC
Start: 2022-09-27 — End: 2022-12-09

## 2022-09-27 NOTE — Telephone Encounter (Signed)
Requested Prescriptions  Pending Prescriptions Disp Refills   losartan (COZAAR) 50 MG tablet 90 tablet 0    Sig: Take 1 tablet (50 mg total) by mouth daily.     Cardiovascular:  Angiotensin Receptor Blockers Failed - 09/26/2022  2:55 PM      Failed - Cr in normal range and within 180 days    Creat  Date Value Ref Range Status  08/09/2022 0.59 (L) 0.60 - 1.00 mg/dL Final         Failed - Valid encounter within last 6 months    Recent Outpatient Visits           1 year ago Acute exacerbation of chronic bronchitis (HCC)   [facility] Medicine [name], NP   1 year ago Chronic pain of left knee   [facility] Family Medicine Donita Brooks, MD   2 years ago Chronic pain of left knee   [facility] Medicine Donita Brooks, MD   2 years ago Herpes zoster without complication   [facility] Pickard, Priscille Heidelberg, MD   2 years ago Pure hypercholesterolemia   [facility] Medicine Donita Brooks, MD       Future Appointments             In 1 week Tanya Nones, Priscille Heidelberg, MD [facility], [facility]            Passed - K in normal range and within 180 days    Potassium  Date Value Ref Range Status  08/09/2022 3.8 3.5 - 5.3 mmol/L Final         Passed - Patient is not pregnant      Passed - Last BP in normal range    BP Readings from Last 1 Encounters:  02/19/22 139/86

## 2022-10-08 ENCOUNTER — Encounter: Payer: PPO | Admitting: Family Medicine

## 2022-10-24 ENCOUNTER — Telehealth: Payer: Self-pay

## 2022-10-24 ENCOUNTER — Inpatient Hospital Stay: Payer: PPO | Admitting: Hematology and Oncology

## 2022-10-24 NOTE — Telephone Encounter (Signed)
Called [facility] to obtain MM on pt and they state she is currently scheduled for 8/2. Called pt as she is scheduled to see MD today for annual f/u and MD will need her MM for the visit. She states she had knee replacement surgery and that is why she is just now getting MM 8/2. She was offered appt with MD for 8/20 at 3:30 and accepted that.

## 2022-10-24 NOTE — Assessment & Plan Note (Deleted)
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  Right Lumpectomy: 09/14/20: Grade 2 IDC 1.2 cm, Margins Neg, ER 95%, PR 95%, Ki 67 1%, Her-2 Neg 3. +/- Adjuvant radiation (patient informed me that she does not want to do radiation).  She has an appointment with radiation oncology coming up. 4.  Adjuvant antiestrogen therapy with Letrozole 2.5 mg daily x5 years started July 2022 Genetic testing 08/30/2020: No pathogenic mutations ------------------------------------------------------------------------------------------------------------------------- Letrozole toxicities: Denies any hot flashes.   Osteoarthritis of the left knee: Getting injections Major depression: I sent a prescription for Effexor today.  We will get our chaplain and counselors to talk to her about grief counseling.   Breast cancer surveillance: 1.  Breast exam 10/24/2022: Benign 2. mammogram at [facility] 08/30/2021: Benign breast density category A   Return to clinic in 1 year for follow-up

## 2022-10-28 ENCOUNTER — Other Ambulatory Visit: Payer: Self-pay | Admitting: Family Medicine

## 2022-10-29 NOTE — Telephone Encounter (Signed)
Requested Prescriptions  Pending Prescriptions Disp Refills   omeprazole (PRILOSEC) 20 MG capsule [Pharmacy Med Name: OMEPRAZOLE DR 20 MG CAPSULE] 30 capsule 0    Sig: TAKE 1 CAPSULE BY MOUTH 2 (TWO) TIMES DAILY. OFFICE VISIT NEEDED FOR ADDITIONAL REFILLS     Gastroenterology: Proton Pump Inhibitors Failed - 10/28/2022  5:55 PM      Failed - Valid encounter within last 12 months    Recent Outpatient Visits           1 year ago Acute exacerbation of chronic bronchitis (HCC)   [facility] Medicine [name], NP   1 year ago Chronic pain of left knee   [facility] Medicine Donita Brooks, MD   2 years ago Chronic pain of left knee   [facility] Medicine Donita Brooks, MD   2 years ago Herpes zoster without complication   [facility] Tanya Nones Priscille Heidelberg, MD   2 years ago Pure hypercholesterolemia   [facility] Family Medicine Pickard, Priscille Heidelberg, MD       Future Appointments             In 1 week Pickard, Priscille Heidelberg, MD [facility] Family Medicine, [facility]             Patient must keep upcoming appointment for further refills.

## 2022-11-05 ENCOUNTER — Encounter: Payer: Self-pay | Admitting: Hematology and Oncology

## 2022-11-08 ENCOUNTER — Encounter: Payer: Self-pay | Admitting: Family Medicine

## 2022-11-08 ENCOUNTER — Ambulatory Visit (INDEPENDENT_AMBULATORY_CARE_PROVIDER_SITE_OTHER): Payer: PPO | Admitting: Family Medicine

## 2022-11-08 VITALS — BP 124/72 | HR 78 | Temp 97.8°F | Ht 62.0 in | Wt 206.6 lb

## 2022-11-08 DIAGNOSIS — I10 Essential (primary) hypertension: Secondary | ICD-10-CM | POA: Diagnosis not present

## 2022-11-08 DIAGNOSIS — K219 Gastro-esophageal reflux disease without esophagitis: Secondary | ICD-10-CM | POA: Insufficient documentation

## 2022-11-08 DIAGNOSIS — Z0001 Encounter for general adult medical examination with abnormal findings: Secondary | ICD-10-CM | POA: Diagnosis not present

## 2022-11-08 DIAGNOSIS — Z1211 Encounter for screening for malignant neoplasm of colon: Secondary | ICD-10-CM | POA: Insufficient documentation

## 2022-11-08 DIAGNOSIS — K625 Hemorrhage of anus and rectum: Secondary | ICD-10-CM | POA: Insufficient documentation

## 2022-11-08 DIAGNOSIS — R1033 Periumbilical pain: Secondary | ICD-10-CM | POA: Insufficient documentation

## 2022-11-08 DIAGNOSIS — Z0000 Encounter for general adult medical examination without abnormal findings: Secondary | ICD-10-CM

## 2022-11-08 DIAGNOSIS — R011 Cardiac murmur, unspecified: Secondary | ICD-10-CM | POA: Diagnosis not present

## 2022-11-08 MED ORDER — TRIAMCINOLONE ACETONIDE 55 MCG/ACT NA AERO: 2.0000 | INHALATION_SPRAY | Freq: Every day | NASAL | 12 refills | Status: DC

## 2022-11-08 MED ORDER — TRAZODONE HCL 50 MG PO TABS
50.0000 mg | ORAL_TABLET | Freq: Every day | ORAL | 1 refills | Status: DC
Start: 2022-11-08 — End: 2022-12-03

## 2022-11-08 NOTE — Progress Notes (Signed)
Subjective:    Patient ID: Christina Lyons, female    DOB: 26-Mar-1941, 82 y.o.   MRN: [id number] Patient has not been since 2022.  She is here today for complete physical exam.  Mammogram was just performed and was normal.  She is overdue for a colonoscopy.  Her last colonoscopy was in 2012.  She will be 83 years old in November.  We discussed this today and she declines a colonoscopy.  She has a history of osteoporosis.  She refuses to take calcium and vitamin D due to constipation.  She is no longer taking Prolia.  She is about to have dental surgery performed.  I recommended Fosamax after her dental surgery was over.  Otherwise she is doing well aside from aches and pains in her knees and shoulders.  Her values are up-to-date.  She is due for the shingles vaccine Immunization History  Administered Date(s) Administered   Fluad Quad(high Dose 65+) 12/29/2018   Influenza, High Dose Seasonal PF 01/05/2018   Influenza,inj,Quad PF,6+ Mos 01/17/2014, 01/11/2015   Influenza-Unspecified 03/05/2011, 12/30/2011, 01/30/2013, 02/08/2016, 05/01/2017, 03/29/2020   Pneumococcal Conjugate-13 03/01/2014   Pneumococcal Polysaccharide-23 12/10/2010   No visits with results within 1 Month(s) from this visit.  Latest known visit with results is:  Lab on 08/09/2022  Component Date Value Ref Range Status   T3 Uptake 08/09/2022 29  22 - 35 % Final   T4, Total 08/09/2022 6.8  5.1 - 11.9 mcg/dL Final   Free Thyroxine Index 08/09/2022 2.0  1.4 - 3.8 Final   TSH 08/09/2022 6.89 (H)  0.40 - 4.50 Lyons/L Final   Cholesterol 08/09/2022 147  <200 mg/dL Final   HDL 24/40/1027 74  > OR = 50 mg/dL Final   Triglycerides 25/36/6440 64  <150 mg/dL Final   LDL Cholesterol (Calc) 08/09/2022 59  mg/dL (calc) Final   Comment: Reference range: <100 . Desirable range <100 mg/dL for primary prevention;   <70 mg/dL for patients with CHD or diabetic patients  with > or = 2 CHD risk factors. Marland Kitchen LDL-C is now calculated using the  Martin-Hopkins  calculation, which is a validated novel method providing  better accuracy than the Friedewald equation in the  estimation of LDL-C.  Horald Pollen et al. Lenox Ahr. 3474;259(56): 2061-2068  (http://education.[employer].com/faq/FAQ164)    Total CHOL/HDL Ratio 08/09/2022 2.0  <3.8 (calc) Final   Non-HDL Cholesterol (Calc) 08/09/2022 73  <130 mg/dL (calc) Final   Comment: For patients with diabetes plus 1 major ASCVD risk  factor, treating to a non-HDL-C goal of <100 mg/dL  (LDL-C of <75 mg/dL) is considered a therapeutic  option.    Glucose, Bld 08/09/2022 89  65 - 99 mg/dL Final   Comment: .            Fasting reference interval .    BUN 08/09/2022 15  7 - 25 mg/dL Final   Creat 64/33/2951 0.59 (L)  0.60 - 1.00 mg/dL Final   eGFR 88/41/6606 92  > OR = 60 mL/min/1.62m2 Final   BUN/Creatinine Ratio 08/09/2022 25 (H)  6 - 22 (calc) Final   Sodium 08/09/2022 144  135 - 146 mmol/L Final   Potassium 08/09/2022 3.8  3.5 - 5.3 mmol/L Final   Chloride 08/09/2022 107  98 - 110 mmol/L Final   CO2 08/09/2022 29  20 - 32 mmol/L Final   Calcium 08/09/2022 8.7  8.6 - 10.4 mg/dL Final   Total Protein 30/16/0109 5.8 (L)  6.1 - 8.1 g/dL Final  Albumin 08/09/2022 3.9  3.6 - 5.1 g/dL Final   Globulin 40/12/2723 1.9  1.9 - 3.7 g/dL (calc) Final   AG Ratio 08/09/2022 2.1  1.0 - 2.5 (calc) Final   Total Bilirubin 08/09/2022 0.5  0.2 - 1.2 mg/dL Final   Alkaline phosphatase (APISO) 08/09/2022 63  37 - 153 U/L Final   AST 08/09/2022 12  10 - 35 U/L Final   ALT 08/09/2022 8  6 - 29 U/L Final   WBC 08/09/2022 4.7  3.8 - 10.8 Thousand/uL Final   RBC 08/09/2022 3.90  3.80 - 5.10 Million/uL Final   Hemoglobin 08/09/2022 13.0  11.7 - 15.5 g/dL Final   HCT 36/64/4034 39.2  35.0 - 45.0 % Final   MCV 08/09/2022 100.5 (H)  80.0 - 100.0 fL Final   MCH 08/09/2022 33.3 (H)  27.0 - 33.0 pg Final   MCHC 08/09/2022 33.2  32.0 - 36.0 g/dL Final   RDW 74/25/9563 11.9  11.0 - 15.0 % Final   Platelets  08/09/2022 146  140 - 400 Thousand/uL Final   MPV 08/09/2022 10.3  7.5 - 12.5 fL Final   Neutro Abs 08/09/2022 2,209  1,500 - 7,800 cells/uL Final   Lymphs Abs 08/09/2022 1,767  850 - 3,900 cells/uL Final   Absolute Monocytes 08/09/2022 451  200 - 950 cells/uL Final   Eosinophils Absolute 08/09/2022 221  15 - 500 cells/uL Final   Basophils Absolute 08/09/2022 52  0 - 200 cells/uL Final   Neutrophils Relative % 08/09/2022 47  % Final   Total Lymphocyte 08/09/2022 37.6  % Final   Monocytes Relative 08/09/2022 9.6  % Final   Eosinophils Relative 08/09/2022 4.7  % Final   Basophils Relative 08/09/2022 1.1  % Final    Past Medical History:  Diagnosis Date   DDD (degenerative disc disease), lumbar    Family history of breast cancer 08/16/2020   GERD (gastroesophageal reflux disease)    History of bronchitis    Hyperlipidemia    Hypertension    Osteomyelitis (HCC) 1946   both legs   Osteoporosis    Other idiopathic scoliosis, thoracolumbar region    Past Surgical History:  Procedure Laterality Date   ABDOMINAL HYSTERECTOMY     BREAST LUMPECTOMY Right 09/12/2020   Procedure: RIGHT BREAST LUMPECTOMY;  Surgeon: Almond Lint, MD;  Location: [facility] OR;  Service: General;  Laterality: Right;   CHOLECYSTECTOMY     JOINT REPLACEMENT     right knee   Current Outpatient Medications on File Prior to Visit  Medication Sig Dispense Refill   Cholecalciferol (VITAMIN D) 50 MCG (2000 UT) CAPS Take 4,000 Units by mouth daily.     gabapentin (NEURONTIN) 300 MG capsule Take 300 mg by mouth 2 (two) times daily.     HYDROcodone-acetaminophen (NORCO) 7.5-325 MG tablet Take 1 tablet by mouth every 6 (six) hours as needed for moderate pain. 30 tablet 0   letrozole (FEMARA) 2.5 MG tablet TAKE 1 TABLET BY MOUTH EVERY DAY 90 tablet 3   LORazepam (ATIVAN) 1 MG tablet TAKE 1 TABLET BY MOUTH 2 TIMES DAILY AS NEEDED FOR ANXIETY. 60 tablet 0   losartan (COZAAR) 50 MG tablet Take 1 tablet (50 mg total) by mouth daily.  90 tablet 0   montelukast (SINGULAIR) 10 MG tablet Take 10 mg by mouth daily.     Multiple Vitamin (MULTIVITAMIN WITH MINERALS) TABS tablet Take 1 tablet by mouth daily with supper.     omeprazole (PRILOSEC) 20 MG capsule TAKE 1  CAPSULE BY MOUTH 2 (TWO) TIMES DAILY. OFFICE VISIT NEEDED FOR ADDITIONAL REFILLS (Patient taking differently: Take 20 mg by mouth daily.) 30 capsule 0   rosuvastatin (CRESTOR) 10 MG tablet Take 1 tablet (10 mg total) by mouth daily. 90 tablet 1   sennosides-docusate sodium (SENOKOT-S) 8.6-50 MG tablet Take 1 tablet by mouth daily as needed for constipation.     venlafaxine XR (EFFEXOR-XR) 37.5 MG 24 hr capsule TAKE 1 CAPSULE BY MOUTH DAILY WITH BREAKFAST. 90 capsule 2   zinc gluconate 50 MG tablet Take 50 mg by mouth daily.     No current facility-administered medications on file prior to visit.   Allergies  Allergen Reactions   Sulfa Antibiotics Hives   Tape Itching    Paper tape   Social History   Socioeconomic History   Marital status: Widowed    Spouse name: Not on file   Number of children: Not on file   Years of education: Not on file   Highest education level: Not on file  Occupational History   Occupation: Retired  Tobacco Use   Smoking status: Former    Current packs/day: 0.00    Average packs/day: 0.5 packs/day for 5.0 years (2.5 ttl pk-yrs)    Types: Cigarettes    Start date: 04/02/1975    Quit date: 04/01/1980    Years since quitting: 42.6   Smokeless tobacco: Never  Vaping Use   Vaping status: Never Used  Substance and Sexual Activity   Alcohol use: No    Alcohol/week: 0.0 standard drinks of alcohol   Drug use: No   Sexual activity: Not on file  Other Topics Concern   Not on file  Social History Narrative   Not on file   Social Determinants of Health   Financial Resource Strain: Low Risk  (05/30/2022)   Overall Financial Resource Strain (CARDIA)    Difficulty of Paying Living Expenses: Not hard at all  Food Insecurity: No Food  Insecurity (05/30/2022)   Hunger Vital Sign    Worried About Running Out of Food in the Last Year: Never true    Ran Out of Food in the Last Year: Never true  Transportation Needs: No Transportation Needs (05/30/2022)   PRAPARE - Administrator, Civil Service (Medical): No    Lack of Transportation (Non-Medical): No  Physical Activity: Inactive (05/30/2022)   Exercise Vital Sign    Days of Exercise per Week: 0 days    Minutes of Exercise per Session: 0 min  Stress: No Stress Concern Present (05/30/2022)   [employer] of Occupational Health - Occupational Stress Questionnaire    Feeling of Stress : Not at all  Social Connections: Moderately Isolated (05/30/2022)   Social Connection and Isolation Panel [NHANES]    Frequency of Communication with Friends and Family: More than three times a week    Frequency of Social Gatherings with Friends and Family: Three times a week    Attends Religious Services: More than 4 times per year    Active Member of Clubs or Organizations: No    Attends Banker Meetings: Never    Marital Status: Widowed  Intimate Partner Violence: Not At Risk (05/30/2022)   Humiliation, Afraid, Rape, and Kick questionnaire    Fear of Current or Ex-Partner: No    Emotionally Abused: No    Physically Abused: No    Sexually Abused: No      Review of Systems  All other systems reviewed and are negative.  Objective:   Physical Exam Vitals reviewed.  Constitutional:      General: She is not in acute distress.    Appearance: Normal appearance. She is obese. She is not ill-appearing, toxic-appearing or diaphoretic.  HENT:     Right Ear: Tympanic membrane and ear canal normal.     Left Ear: Tympanic membrane and ear canal normal.     Nose: No congestion or rhinorrhea.     Mouth/Throat:     Mouth: Mucous membranes are moist.     Pharynx: Oropharynx is clear. No oropharyngeal exudate or posterior oropharyngeal erythema.  Eyes:      Extraocular Movements: Extraocular movements intact.     Conjunctiva/sclera: Conjunctivae normal.     Pupils: Pupils are equal, round, and reactive to light.  Neck:     Vascular: No carotid bruit.  Cardiovascular:     Rate and Rhythm: Normal rate and regular rhythm.     Heart sounds: Murmur heard.     No friction rub. No gallop.  Pulmonary:     Effort: Pulmonary effort is normal. No respiratory distress.     Breath sounds: Normal breath sounds. No stridor. No wheezing, rhonchi or rales.  Chest:     Chest wall: No tenderness.  Abdominal:     General: Bowel sounds are normal. There is no distension.     Palpations: Abdomen is soft. There is no mass.     Tenderness: There is no abdominal tenderness. There is no guarding or rebound.     Hernia: No hernia is present.  Musculoskeletal:     Cervical back: Neck supple.     Left knee: Deformity and effusion present. Decreased range of motion. Tenderness present over the medial joint line and lateral joint line.  Lymphadenopathy:     Cervical: No cervical adenopathy.  Neurological:     General: No focal deficit present.     Mental Status: She is alert and oriented to person, place, and time. Mental status is at baseline.     Cranial Nerves: No cranial nerve deficit.     Motor: No weakness.     Coordination: Coordination normal.     Gait: Gait normal.     Deep Tendon Reflexes: Reflexes normal.  Psychiatric:        Mood and Affect: Mood normal.        Behavior: Behavior normal.        Thought Content: Thought content normal.        Judgment: Judgment normal.           Assessment & Plan:  Murmur - Plan: ECHOCARDIOGRAM COMPLETE  Encounter for Medicare annual wellness exam  Primary hypertension Patient has what appears to be a diastolic murmur on exam.  Obtain an echocardiogram to evaluate further.  I suspect an issue with mitral valve.  This is based on location.  However the patient is asymptomatic blood pressure is excellent.   Lab work is outstanding aside from very mild subclinical hypothyroidism.  Recommended the shingles vaccine and RSV but the patient declined.  Patient denies any falls or depression or memory loss.  Recommended using Fosamax once her dental surgery is over for osteoporosis

## 2022-11-12 ENCOUNTER — Other Ambulatory Visit: Payer: Self-pay | Admitting: Family Medicine

## 2022-11-14 NOTE — Telephone Encounter (Signed)
OV - 11/08/22 Requested Prescriptions  Pending Prescriptions Disp Refills   omeprazole (PRILOSEC) 20 MG capsule [Pharmacy Med Name: OMEPRAZOLE DR 20 MG CAPSULE] 180 capsule 1    Sig: TAKE 1 CAPSULE BY MOUTH 2 (TWO) TIMES DAILY. OFFICE VISIT NEEDED FOR ADDITIONAL REFILLS     Gastroenterology: Proton Pump Inhibitors Failed - 11/12/2022  2:51 PM      Failed - Valid encounter within last 12 months    Recent Outpatient Visits           1 year ago Acute exacerbation of chronic bronchitis (HCC)   [facility] [name], NP   1 year ago Chronic pain of left knee   [facility] Donita Brooks, MD   2 years ago Chronic pain of left knee   [facility] Donita Brooks, MD   2 years ago Herpes zoster without complication   [facility] Tanya Nones Priscille Heidelberg, MD   2 years ago Pure hypercholesterolemia   [facility] Pickard, Priscille Heidelberg, MD

## 2022-11-19 ENCOUNTER — Inpatient Hospital Stay: Payer: PPO | Attending: Hematology and Oncology | Admitting: Hematology and Oncology

## 2022-11-19 VITALS — BP 153/75 | HR 88 | Temp 97.9°F | Resp 18 | Ht 62.0 in | Wt 204.5 lb

## 2022-11-19 DIAGNOSIS — M1712 Unilateral primary osteoarthritis, left knee: Secondary | ICD-10-CM | POA: Insufficient documentation

## 2022-11-19 DIAGNOSIS — Z79899 Other long term (current) drug therapy: Secondary | ICD-10-CM | POA: Insufficient documentation

## 2022-11-19 DIAGNOSIS — Z79811 Long term (current) use of aromatase inhibitors: Secondary | ICD-10-CM | POA: Insufficient documentation

## 2022-11-19 DIAGNOSIS — F329 Major depressive disorder, single episode, unspecified: Secondary | ICD-10-CM | POA: Insufficient documentation

## 2022-11-19 DIAGNOSIS — Z170 Estrogen receptor positive status [ER+]: Secondary | ICD-10-CM | POA: Insufficient documentation

## 2022-11-19 DIAGNOSIS — C50411 Malignant neoplasm of upper-outer quadrant of right female breast: Secondary | ICD-10-CM | POA: Insufficient documentation

## 2022-11-19 NOTE — Progress Notes (Signed)
Patient Care Team: Donita Brooks, MD as PCP - General (Family Medicine) Almond Lint, MD as Consulting Physician (General Surgery) Serena Croissant, MD as Consulting Physician (Hematology and Oncology) Lonie Peak, MD as Attending Physician (Radiation Oncology) Axel Filler, Larna Daughters, NP as Nurse Practitioner (Hematology and Oncology) Maryclare Labrador, RN as Registered Nurse Manning Charity, OD as Referring Physician (Optometry) Tarry Kos, MD as Attending Physician (Orthopedic Surgery) Cephus Shelling, MD as Consulting Physician (Vascular Surgery)  DIAGNOSIS:  Encounter Diagnosis  Name Primary?   Malignant neoplasm of upper-outer quadrant of right breast in female, estrogen receptor positive (HCC) Yes    SUMMARY OF ONCOLOGIC HISTORY: Oncology History  Malignant neoplasm of upper-outer quadrant of right breast in female, estrogen receptor positive (HCC)  08/10/2020 Initial Diagnosis   Palpable right breast lump 12 o'clock position near areola: 0.5 cm by mammogram and 0.7 cm by ultrasound: Biopsy grade 2 IDC ER 95% PR 95%, Ki-67 10%, HER2 negative   08/16/2020 Cancer Staging   Staging form: Breast, AJCC 8th Edition - Clinical stage from 08/16/2020: Stage IA (cT1b, cN0, cM0, G2, ER+, PR+, HER2-) - Signed by Serena Croissant, MD on 08/16/2020 Histologic grading system: 3 grade system   08/30/2020 Genetic Testing   Negative hereditary cancer genetic testing: no pathogenic variants detected in [facility] Custom CancerNext +RNAinsight Panel.  The report date is August 30, 2020.   The CustomNext-Cancer+RNAinsight panel offered by [employer] includes sequencing and rearrangement analysis for the following 47 genes:  APC, ATM, AXIN2, BARD1, BMPR1A, BRCA1, BRCA2, BRIP1, CDH1, CDK4, CDKN2A, CHEK2, DICER1, EPCAM, GREM1, HOXB13, MEN1, MLH1, MSH2, MSH3, MSH6, MUTYH, NBN, NF1, NF2, NTHL1, PALB2, PMS2, POLD1, POLE, PTEN, RAD51C, RAD51D, RECQL, RET, SDHA, SDHAF2, SDHB, SDHC, SDHD, SMAD4, SMARCA4, STK11,  TP53, TSC1, TSC2, and VHL.  RNA data is routinely analyzed for use in variant interpretation for all genes.   09/2020 -  Anti-estrogen oral therapy   Letrozole daily     CHIEF COMPLIANT: Follow-up for left breast cancer on Letrozole    INTERVAL HISTORY: Christina Lyons is a 82 y.o. with above-mentioned history of left breast cancer. She presents to the clinic today for a follow-up. She states that she is tolerating letrozole. Patient reports she had a knee replacement. She is tolerating the letrozole extremely well with no side effects or complaints.   ALLERGIES:  is allergic to sulfa antibiotics and tape.  MEDICATIONS:  Current Outpatient Medications  Medication Sig Dispense Refill   albuterol (VENTOLIN HFA) 108 (90 Base) MCG/ACT inhaler SMARTSIG:1 Puff(s) Via Inhaler 4 Times Daily PRN     Cholecalciferol (VITAMIN D) 50 MCG (2000 UT) CAPS Take 4,000 Units by mouth daily.     gabapentin (NEURONTIN) 300 MG capsule Take 300 mg by mouth 2 (two) times daily.     HYDROcodone-acetaminophen (NORCO) 7.5-325 MG tablet Take 1 tablet by mouth every 6 (six) hours as needed for moderate pain. 30 tablet 0   letrozole (FEMARA) 2.5 MG tablet TAKE 1 TABLET BY MOUTH EVERY DAY 90 tablet 3   LORazepam (ATIVAN) 1 MG tablet TAKE 1 TABLET BY MOUTH 2 TIMES DAILY AS NEEDED FOR ANXIETY. 60 tablet 0   losartan (COZAAR) 50 MG tablet Take 1 tablet (50 mg total) by mouth daily. 90 tablet 0   montelukast (SINGULAIR) 10 MG tablet Take 10 mg by mouth daily.     Multiple Vitamin (MULTIVITAMIN WITH MINERALS) TABS tablet Take 1 tablet by mouth daily with supper.     omeprazole (PRILOSEC) 20 MG  capsule Take 1 capsule (20 mg total) by mouth 2 (two) times daily before a meal. 180 capsule 1   rosuvastatin (CRESTOR) 10 MG tablet Take 1 tablet (10 mg total) by mouth daily. 90 tablet 1   sennosides-docusate sodium (SENOKOT-S) 8.6-50 MG tablet Take 1 tablet by mouth daily as needed for constipation.     traZODone (DESYREL) 50 MG  tablet Take 1 tablet (50 mg total) by mouth at bedtime. 30 tablet 1   triamcinolone (NASACORT) 55 MCG/ACT AERO nasal inhaler Place 2 sprays into the nose daily. 1 each 12   venlafaxine XR (EFFEXOR-XR) 37.5 MG 24 hr capsule TAKE 1 CAPSULE BY MOUTH DAILY WITH BREAKFAST. 90 capsule 2   zinc gluconate 50 MG tablet Take 50 mg by mouth daily.     No current facility-administered medications for this visit.    PHYSICAL EXAMINATION: ECOG PERFORMANCE STATUS: 1 - Symptomatic but completely ambulatory  Vitals:   11/19/22 1541  BP: (!) 153/75  Pulse: 88  Resp: 18  Temp: 97.9 F (36.6 C)  SpO2: 98%   Filed Weights   11/19/22 1541  Weight: 204 lb 8 oz (92.8 kg)    BREAST: No palpable masses or nodules in either right or left breasts. No palpable axillary supraclavicular or infraclavicular adenopathy no breast tenderness or nipple discharge. (exam performed in the presence of a chaperone)  LABORATORY DATA:  I have reviewed the data as listed    Latest Ref Rng & Units 08/09/2022    8:29 AM 08/16/2020   12:20 PM 06/30/2020   12:21 PM  CMP  Glucose 65 - 99 mg/dL 89  88  88   BUN 7 - 25 mg/dL 15  23  27    Creatinine 0.60 - 1.00 mg/dL 1.61  0.96  0.45   Sodium 135 - 146 mmol/L 144  142  143   Potassium 3.5 - 5.3 mmol/L 3.8  3.9  4.2   Chloride 98 - 110 mmol/L 107  103  106   CO2 20 - 32 mmol/L 29  29  23    Calcium 8.6 - 10.4 mg/dL 8.7  9.4  9.3   Total Protein 6.1 - 8.1 g/dL 5.8  6.9  6.2   Total Bilirubin 0.2 - 1.2 mg/dL 0.5  0.5  0.5   Alkaline Phos 38 - 126 U/L  89    AST 10 - 35 U/L 12  20  16    ALT 6 - 29 U/L 8  13  10      Lab Results  Component Value Date   WBC 4.7 08/09/2022   HGB 13.0 08/09/2022   HCT 39.2 08/09/2022   MCV 100.5 (H) 08/09/2022   PLT 146 08/09/2022   NEUTROABS 2,209 08/09/2022    ASSESSMENT & PLAN:  Malignant neoplasm of upper-outer quadrant of right breast in female, estrogen receptor positive (HCC) 08/10/2020:Palpable right breast lump 12 o'clock  position near areola: 0.5 cm by mammogram and 0.7 cm by ultrasound: Biopsy grade 2 IDC ER 95% PR 95%, Ki-67 10%, HER2 negative   Treatment plan: 1.  Right Lumpectomy: 09/14/20: Grade 2 IDC 1.2 cm, Margins Neg, ER 95%, PR 95%, Ki 67 1%, Her-2 Neg 3. +/- Adjuvant radiation (patient informed me that she does not want to do radiation).  She has an appointment with radiation oncology coming up. 4.  Adjuvant antiestrogen therapy with Letrozole 2.5 mg daily x5 years started July 2022 Genetic testing 08/30/2020: No pathogenic mutations ------------------------------------------------------------------------------------------------------------------------- Letrozole toxicities: Denies any hot flashes.  Osteoarthritis of the left knee: Getting injections Major depression: On Effexor.  It is working very well.  She is looking forward to a new day.   Breast cancer surveillance: 1.  Breast exam 11/19/2022: Benign 2. mammogram at [facility] 11/01/2022: Benign breast density category A   Return to clinic in 1 year for follow-up   No orders of the defined types were placed in this encounter.  The patient has a good understanding of the overall plan. she agrees with it. she will call with any problems that may develop before the next visit here. Total time spent: 30 mins including face to face time and time spent for planning, charting and co-ordination of care   Tamsen Meek, MD 11/19/22    I Janan Ridge am acting as a Neurosurgeon for [employer]  I have reviewed the above documentation for accuracy and completeness, and I agree with the above.

## 2022-11-19 NOTE — Assessment & Plan Note (Signed)
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  Right Lumpectomy: 09/14/20: Grade 2 IDC 1.2 cm, Margins Neg, ER 95%, PR 95%, Ki 67 1%, Her-2 Neg 3. +/- Adjuvant radiation (patient informed me that she does not want to do radiation).  She has an appointment with radiation oncology coming up. 4.  Adjuvant antiestrogen therapy with Letrozole 2.5 mg daily x5 years started July 2022 Genetic testing 08/30/2020: No pathogenic mutations ------------------------------------------------------------------------------------------------------------------------- Letrozole toxicities: Denies any hot flashes.   Osteoarthritis of the left knee: Getting injections Major depression: I sent a prescription for Effexor today.  We will get our chaplain and counselors to talk to her about grief counseling.   Breast cancer surveillance: 1.  Breast exam 11/19/2022: Benign 2. mammogram at [facility] 11/01/2022: Benign breast density category A   Return to clinic in 1 year for follow-up

## 2022-11-28 ENCOUNTER — Encounter (HOSPITAL_COMMUNITY): Payer: Self-pay | Admitting: Family Medicine

## 2022-11-28 ENCOUNTER — Ambulatory Visit (HOSPITAL_COMMUNITY): Payer: PPO | Attending: Family Medicine

## 2022-11-30 ENCOUNTER — Other Ambulatory Visit: Payer: Self-pay | Admitting: Family Medicine

## 2022-12-03 NOTE — Telephone Encounter (Signed)
Requested Prescriptions  Pending Prescriptions Disp Refills   traZODone (DESYREL) 50 MG tablet [Pharmacy Med Name: TRAZODONE 50 MG TABLET] 90 tablet 0    Sig: TAKE 1 TABLET BY MOUTH EVERYDAY AT BEDTIME     Psychiatry: Antidepressants - Serotonin Modulator Failed - 11/30/2022 10:30 AM      Failed - Valid encounter within last 6 months    Recent Outpatient Visits           1 year ago Acute exacerbation of chronic bronchitis (HCC)   [facility] [name], NP   1 year ago Chronic pain of left knee   [facility] Donita Brooks, MD   2 years ago Chronic pain of left knee   [facility] Donita Brooks, MD   2 years ago Herpes zoster without complication   [facility] Pickard, Priscille Heidelberg, MD   2 years ago Pure hypercholesterolemia   [facility] Medicine Pickard, Priscille Heidelberg, MD

## 2022-12-07 ENCOUNTER — Other Ambulatory Visit: Payer: Self-pay | Admitting: Family Medicine

## 2022-12-07 DIAGNOSIS — I10 Essential (primary) hypertension: Secondary | ICD-10-CM

## 2022-12-09 ENCOUNTER — Ambulatory Visit (HOSPITAL_COMMUNITY): Payer: PPO | Attending: Family Medicine

## 2022-12-09 DIAGNOSIS — R011 Cardiac murmur, unspecified: Secondary | ICD-10-CM

## 2022-12-09 LAB — ECHOCARDIOGRAM COMPLETE
Area-P 1/2: 3.31 cm2
MV M vel: 6.17 m/s
MV Peak grad: 152.3 mmHg
S' Lateral: 3 cm

## 2022-12-09 NOTE — Telephone Encounter (Signed)
Requested Prescriptions  Pending Prescriptions Disp Refills   losartan (COZAAR) 50 MG tablet [Pharmacy Med Name: LOSARTAN POTASSIUM 50 MG TAB] 90 tablet 1    Sig: TAKE 1 TABLET BY MOUTH EVERY DAY     Cardiovascular:  Angiotensin Receptor Blockers Failed - 12/07/2022  1:08 PM      Failed - Cr in normal range and within 180 days    Creat  Date Value Ref Range Status  08/09/2022 0.59 (L) 0.60 - 1.00 mg/dL Final         Failed - Last BP in normal range    BP Readings from Last 1 Encounters:  11/19/22 (!) 153/75         Failed - Valid encounter within last 6 months    Recent Outpatient Visits           1 year ago Acute exacerbation of chronic bronchitis (HCC)   [facility] [name], NP   1 year ago Chronic pain of left knee   [facility] Donita Brooks, MD   2 years ago Chronic pain of left knee   [facility] Donita Brooks, MD   2 years ago Herpes zoster without complication   [facility] Pickard, Priscille Heidelberg, MD   2 years ago Pure hypercholesterolemia   [facility] Medicine Pickard, Priscille Heidelberg, MD              Passed - K in normal range and within 180 days    Potassium  Date Value Ref Range Status  08/09/2022 3.8 3.5 - 5.3 mmol/L Final         Passed - Patient is not pregnant

## 2023-01-19 ENCOUNTER — Other Ambulatory Visit: Payer: Self-pay | Admitting: Family Medicine

## 2023-01-20 NOTE — Telephone Encounter (Signed)
Requested Prescriptions  Pending Prescriptions Disp Refills   traZODone (DESYREL) 50 MG tablet [Pharmacy Med Name: TRAZODONE 50 MG TABLET] 90 tablet 0    Sig: TAKE 1 TABLET BY MOUTH EVERYDAY AT BEDTIME     Psychiatry: Antidepressants - Serotonin Modulator Failed - 01/19/2023 12:34 AM      Failed - Valid encounter within last 6 months    Recent Outpatient Visits           1 year ago Acute exacerbation of chronic bronchitis (HCC)   [facility] Medicine [name], NP   1 year ago Chronic pain of left knee   [facility] Donita Brooks, MD   2 years ago Chronic pain of left knee   [facility] Donita Brooks, MD   2 years ago Herpes zoster without complication   [facility] Pickard, Priscille Heidelberg, MD   2 years ago Pure hypercholesterolemia   [facility] Medicine Pickard, Priscille Heidelberg, MD

## 2023-01-31 ENCOUNTER — Telehealth: Payer: Self-pay

## 2023-01-31 ENCOUNTER — Other Ambulatory Visit: Payer: Self-pay

## 2023-01-31 ENCOUNTER — Other Ambulatory Visit: Payer: Self-pay | Admitting: Family Medicine

## 2023-01-31 DIAGNOSIS — J0100 Acute maxillary sinusitis, unspecified: Secondary | ICD-10-CM

## 2023-01-31 DIAGNOSIS — E7800 Pure hypercholesterolemia, unspecified: Secondary | ICD-10-CM

## 2023-01-31 MED ORDER — MONTELUKAST SODIUM 10 MG PO TABS
10.0000 mg | ORAL_TABLET | Freq: Every day | ORAL | 1 refills | Status: DC
Start: 2023-01-31 — End: 2023-04-29

## 2023-01-31 MED ORDER — HYDROCODONE-ACETAMINOPHEN 7.5-325 MG PO TABS: 1.0000 | ORAL_TABLET | Freq: Four times a day (QID) | ORAL | 0 refills | Status: DC | PRN

## 2023-01-31 MED ORDER — ROSUVASTATIN CALCIUM 10 MG PO TABS
10.0000 mg | ORAL_TABLET | Freq: Every day | ORAL | 1 refills | Status: DC
Start: 2023-01-31 — End: 2023-12-31

## 2023-01-31 NOTE — Telephone Encounter (Signed)
Pt called in stating that she forgot to mention to pcp at CPE that she needed refills of these meds:   rosuvastatin (CRESTOR) 10 MG tablet [[id number]] HYDROcodone-acetaminophen (NORCO) 7.5-325 MG tablet [[id number]] montelukast (SINGULAIR) 10 MG tablet [[id number]] LORazepam (ATIVAN) 1 MG tablet [[id number]]  LOV: 11/08/22 CPE  PHARMACY: [id number] [place], [place] - [address] [place] [address] Phone: [phone]  Fax: [phone]  CB#: [phone]

## 2023-02-03 ENCOUNTER — Other Ambulatory Visit: Payer: Self-pay | Admitting: Family Medicine

## 2023-02-03 MED ORDER — LORAZEPAM 1 MG PO TABS: 1.0000 mg | ORAL_TABLET | Freq: Two times a day (BID) | ORAL | 0 refills | Status: DC | PRN

## 2023-02-03 NOTE — Telephone Encounter (Signed)
Pt called in stating that she received all but this med LORazepam (ATIVAN) 1 MG tablet [[id number]]. Pt asks if this could be resent to pharmacy please.  Cb#: [phone]

## 2023-03-06 ENCOUNTER — Telehealth: Payer: Self-pay

## 2023-03-06 NOTE — Telephone Encounter (Signed)
Copied from CRM [id number]. Topic: Clinical - Medication Question >> Mar 06, 2023 10:23 AM Alcus Dad H wrote: Reason for CRM: Pt started with a sore throat and was hoarse, is now coughing up yellowish/greenish stuff when she coughs. Wants to know if Dr. Tanya Nones can call her in a vpack. Would like a call back

## 2023-03-12 ENCOUNTER — Encounter: Payer: Self-pay | Admitting: Family Medicine

## 2023-03-12 ENCOUNTER — Ambulatory Visit: Payer: PPO | Admitting: Family Medicine

## 2023-03-12 VITALS — BP 124/80 | HR 86 | Temp 98.2°F | Ht 62.0 in | Wt 204.1 lb

## 2023-03-12 DIAGNOSIS — J329 Chronic sinusitis, unspecified: Secondary | ICD-10-CM | POA: Diagnosis not present

## 2023-03-12 DIAGNOSIS — B9689 Other specified bacterial agents as the cause of diseases classified elsewhere: Secondary | ICD-10-CM

## 2023-03-12 MED ORDER — AMOXICILLIN-POT CLAVULANATE 875-125 MG PO TABS: 1.0000 | ORAL_TABLET | Freq: Two times a day (BID) | ORAL | 0 refills | Status: DC

## 2023-03-12 MED ORDER — HYDROCODONE BIT-HOMATROP MBR 5-1.5 MG/5ML PO SOLN: 5.0000 mL | Freq: Three times a day (TID) | ORAL | 0 refills | Status: DC | PRN

## 2023-03-12 NOTE — Assessment & Plan Note (Signed)
Symptoms consistent with bacterial sinusitis. Start Augmentin BID for 7 days and Hycodan for cough that is keeping her awake at night. Discussed risks of sedation and falls, she will not take other opioids or sedating medications with this. May use saline flushes. Return to office if symptoms persist or worsen

## 2023-03-12 NOTE — Progress Notes (Signed)
Subjective:  HPI: Christina Lyons is a 82 y.o. female presenting on 03/12/2023 for Cough (X 3 days. ), Hoarse (X 2 days.), and Nasal Congestion (X 3 days. Lots of bloody mucous. )   Cough   Patient is in today for 8 days of mucopurulent cough and rhinorrhea with blood streaked mucus. She has had some sinus pressure and congestion. She denies fever, body aches, SOB, pleurisy. Has tried Mucinex, Flonase, and Albuterol. No known sick exposures and no home covid test.  Review of Systems  Respiratory:  Positive for cough.   All other systems reviewed and are negative.   Relevant past medical history reviewed and updated as indicated.   Past Medical History:  Diagnosis Date   DDD (degenerative disc disease), lumbar    Family history of breast cancer 08/16/2020   GERD (gastroesophageal reflux disease)    History of bronchitis    Hyperlipidemia    Hypertension    Osteomyelitis (HCC) 1946   both legs   Osteoporosis    Other idiopathic scoliosis, thoracolumbar region      Past Surgical History:  Procedure Laterality Date   ABDOMINAL HYSTERECTOMY     BREAST LUMPECTOMY Right 09/12/2020   Procedure: RIGHT BREAST LUMPECTOMY;  Surgeon: Almond Lint, MD;  Location: [facility] OR;  Service: General;  Laterality: Right;   CHOLECYSTECTOMY     JOINT REPLACEMENT     right knee    Allergies and medications reviewed and updated.   Current Outpatient Medications:    albuterol (VENTOLIN HFA) 108 (90 Base) MCG/ACT inhaler, SMARTSIG:1 Puff(s) Via Inhaler 4 Times Daily PRN, Disp: , Rfl:    amoxicillin-clavulanate (AUGMENTIN) 875-125 MG tablet, Take 1 tablet by mouth 2 (two) times daily., Disp: 20 tablet, Rfl: 0   Cholecalciferol (VITAMIN D) 50 MCG (2000 UT) CAPS, Take 4,000 Units by mouth daily., Disp: , Rfl:    gabapentin (NEURONTIN) 300 MG capsule, Take 300 mg by mouth 2 (two) times daily., Disp: , Rfl:    HYDROcodone bit-homatropine (HYCODAN) 5-1.5 MG/5ML syrup, Take 5 mLs by mouth every 8 (eight)  hours as needed for cough., Disp: 120 mL, Rfl: 0   HYDROcodone-acetaminophen (NORCO) 7.5-325 MG tablet, Take 1 tablet by mouth every 6 (six) hours as needed for moderate pain (pain score 4-6)., Disp: 30 tablet, Rfl: 0   letrozole (FEMARA) 2.5 MG tablet, TAKE 1 TABLET BY MOUTH EVERY DAY, Disp: 90 tablet, Rfl: 3   LORazepam (ATIVAN) 1 MG tablet, Take 1 tablet (1 mg total) by mouth 2 (two) times daily as needed for anxiety., Disp: 60 tablet, Rfl: 0   losartan (COZAAR) 50 MG tablet, TAKE 1 TABLET BY MOUTH EVERY DAY, Disp: 90 tablet, Rfl: 1   montelukast (SINGULAIR) 10 MG tablet, Take 1 tablet (10 mg total) by mouth daily., Disp: 90 tablet, Rfl: 1   Multiple Vitamin (MULTIVITAMIN WITH MINERALS) TABS tablet, Take 1 tablet by mouth daily with supper., Disp: , Rfl:    omeprazole (PRILOSEC) 20 MG capsule, Take 1 capsule (20 mg total) by mouth 2 (two) times daily before a meal., Disp: 180 capsule, Rfl: 1   rosuvastatin (CRESTOR) 10 MG tablet, Take 1 tablet (10 mg total) by mouth daily., Disp: 90 tablet, Rfl: 1   sennosides-docusate sodium (SENOKOT-S) 8.6-50 MG tablet, Take 1 tablet by mouth daily as needed for constipation., Disp: , Rfl:    traZODone (DESYREL) 50 MG tablet, TAKE 1 TABLET BY MOUTH EVERYDAY AT BEDTIME, Disp: 90 tablet, Rfl: 0   triamcinolone (NASACORT) 55 MCG/ACT  AERO nasal inhaler, Place 2 sprays into the nose daily., Disp: 1 each, Rfl: 12   venlafaxine XR (EFFEXOR-XR) 37.5 MG 24 hr capsule, TAKE 1 CAPSULE BY MOUTH DAILY WITH BREAKFAST., Disp: 90 capsule, Rfl: 2   zinc gluconate 50 MG tablet, Take 50 mg by mouth daily., Disp: , Rfl:   Allergies  Allergen Reactions   Sulfa Antibiotics Hives   Tape Itching    Paper tape    Objective:   BP 124/80 (BP Location: Left Arm)   Pulse 86   Temp 98.2 F (36.8 C)   Ht 5\' 2"  (1.575 m)   Wt 204 lb 2 oz (92.6 kg)   SpO2 96%   BMI 37.33 kg/m      03/12/2023    1:55 PM 11/19/2022    3:41 PM 11/08/2022    3:05 PM  Vitals with BMI  Height 5'  2" 5\' 2"  5\' 2"   Weight 204 lbs 2 oz 204 lbs 8 oz 206 lbs 10 oz  BMI 37.33 37.39 37.78  Systolic 124 153 657  Diastolic 80 75 72  Pulse 86 88 78     Physical Exam Vitals and nursing note reviewed.  Constitutional:      Appearance: Normal appearance. She is normal weight.  HENT:     Head: Normocephalic and atraumatic.     Right Ear: Tympanic membrane, ear canal and external ear normal.     Left Ear: Tympanic membrane, ear canal and external ear normal.     Nose: Congestion present.     Mouth/Throat:     Mouth: Mucous membranes are moist.     Pharynx: Oropharynx is clear.  Eyes:     Conjunctiva/sclera: Conjunctivae normal.  Cardiovascular:     Rate and Rhythm: Normal rate and regular rhythm.     Pulses: Normal pulses.     Heart sounds: Normal heart sounds.  Pulmonary:     Effort: Pulmonary effort is normal.     Breath sounds: Normal breath sounds.  Musculoskeletal:     Cervical back: No tenderness.  Lymphadenopathy:     Cervical: No cervical adenopathy.  Skin:    General: Skin is warm and dry.  Neurological:     General: No focal deficit present.     Mental Status: She is alert and oriented to person, place, and time. Mental status is at baseline.  Psychiatric:        Mood and Affect: Mood normal.        Behavior: Behavior normal.        Thought Content: Thought content normal.        Judgment: Judgment normal.     Assessment & Plan:  Bacterial sinusitis Assessment & Plan: Symptoms consistent with bacterial sinusitis. Start Augmentin BID for 7 days and Hycodan for cough that is keeping her awake at night. Discussed risks of sedation and falls, she will not take other opioids or sedating medications with this. May use saline flushes. Return to office if symptoms persist or worsen   Other orders -     Amoxicillin-Pot Clavulanate; Take 1 tablet by mouth 2 (two) times daily.  Dispense: 20 tablet; Refill: 0 -     HYDROcodone Bit-Homatrop MBr; Take 5 mLs by mouth every 8  (eight) hours as needed for cough.  Dispense: 120 mL; Refill: 0     Follow up plan: Return if symptoms worsen or fail to improve.  Park Meo, FNP

## 2023-04-03 ENCOUNTER — Other Ambulatory Visit: Payer: Self-pay | Admitting: Family Medicine

## 2023-04-03 ENCOUNTER — Telehealth: Payer: Self-pay

## 2023-04-03 MED ORDER — LORAZEPAM 1 MG PO TABS: 1.0000 mg | ORAL_TABLET | Freq: Two times a day (BID) | ORAL | 0 refills | Status: DC | PRN

## 2023-04-03 NOTE — Telephone Encounter (Signed)
 Copied from CRM [id number]. Topic: Clinical - Medication Refill >> Apr 03, 2023 10:52 AM Wyona SQUIBB wrote: Most Recent Primary Care Visit:  Provider: KAYLA JEOFFREY RAMAN  Department: [facility]  Visit Type: OFFICE VISIT  Date: 03/12/2023  Medication: LORazepam  (ATIVAN ) 1 MG tablet  Has the patient contacted their pharmacy? No (Agent: If no, request that the patient contact the pharmacy for the refill. If patient does not wish to contact the pharmacy document the reason why and proceed with request.) (Agent: If yes, when and what did the pharmacy advise?)  Is this the correct pharmacy for this prescription? Yes If no, delete pharmacy and type the correct one.  This is the patient's preferred pharmacy:  [id number] [place], [place] - [address] [place] [address] Phone: [phone] Fax: [phone]   Has the prescription been filled recently? No  Is the patient out of the medication? No  Has the patient been seen for an appointment in the last year OR does the patient have an upcoming appointment? Yes  Can we respond through MyChart? Yes  Agent: Please be advised that Rx refills may take up to 3 business days. We ask that you follow-up with your pharmacy.

## 2023-04-29 ENCOUNTER — Other Ambulatory Visit: Payer: Self-pay | Admitting: Family Medicine

## 2023-04-29 DIAGNOSIS — J0100 Acute maxillary sinusitis, unspecified: Secondary | ICD-10-CM

## 2023-04-29 NOTE — Telephone Encounter (Signed)
Copied from CRM [id number]. Topic: Clinical - Medication Refill >> Apr 29, 2023  4:43 PM Elle L wrote: Most Recent Primary Care Visit:  Provider: Kurtis Bushman S  Department: [facility]  Visit Type: OFFICE VISIT  Date: 03/12/2023  Medication: HYDROcodone-acetaminophen (NORCO) 7.5-325 MG tablet AND montelukast (SINGULAIR) 10 MG tablet  Has the patient contacted their pharmacy? Yes  Is this the correct pharmacy for this prescription? Yes  This is the patient's preferred pharmacy:  [id number] [place], [place] - [address] [place] [address] Phone: [phone] Fax: [phone]   Has the prescription been filled recently? No  Is the patient out of the medication? Yes  Has the patient been seen for an appointment in the last year OR does the patient have an upcoming appointment? Yes  Can we respond through MyChart? No  Agent: Please be advised that Rx refills may take up to 3 business days. We ask that you follow-up with your pharmacy.

## 2023-04-30 MED ORDER — MONTELUKAST SODIUM 10 MG PO TABS: 10.0000 mg | ORAL_TABLET | Freq: Every day | ORAL | 1 refills | Status: DC

## 2023-04-30 NOTE — Telephone Encounter (Signed)
Requested medication (s) are due for refill today: Yes  Requested medication (s) are on the active medication list: Yes  Last refill:  01/31/23  Future visit scheduled: No  Notes to clinic:  Unable to refill per protocol, cannot delegate.      Requested Prescriptions  Pending Prescriptions Disp Refills   HYDROcodone-acetaminophen (NORCO) 7.5-325 MG tablet 30 tablet 0    Sig: Take 1 tablet by mouth every 6 (six) hours as needed for moderate pain (pain score 4-6).     Not Delegated - Analgesics:  Opioid Agonist Combinations Failed - 04/30/2023  8:48 AM      Failed - This refill cannot be delegated      Failed - Urine Drug Screen completed in last 360 days      Failed - Valid encounter within last 3 months    Recent Outpatient Visits           2 years ago Acute exacerbation of chronic bronchitis (HCC)   [facility] [name], NP   2 years ago Chronic pain of left knee   [facility] Donita Brooks, MD   2 years ago Chronic pain of left knee   [facility] Donita Brooks, MD   3 years ago Herpes zoster without complication   [facility] Pickard, Priscille Heidelberg, MD   3 years ago Pure hypercholesterolemia   [facility] Pickard, Priscille Heidelberg, MD              Signed Prescriptions Disp Refills   montelukast (SINGULAIR) 10 MG tablet 90 tablet 1    Sig: Take 1 tablet (10 mg total) by mouth daily.     Pulmonology:  Leukotriene Inhibitors Failed - 04/30/2023  8:48 AM      Failed - Valid encounter within last 12 months    Recent Outpatient Visits           2 years ago Acute exacerbation of chronic bronchitis (HCC)   [facility] [name], NP   2 years ago Chronic pain of left knee   [facility] Donita Brooks, MD   2 years ago Chronic pain of left knee   [facility] Donita Brooks, MD   3 years ago Herpes zoster without  complication   [facility] Pickard, Priscille Heidelberg, MD   3 years ago Pure hypercholesterolemia   [facility] Pickard, Priscille Heidelberg, MD

## 2023-04-30 NOTE — Telephone Encounter (Signed)
Last non-acute  office visit 11/08/22 Requested Prescriptions  Pending Prescriptions Disp Refills   HYDROcodone-acetaminophen (NORCO) 7.5-325 MG tablet 30 tablet 0    Sig: Take 1 tablet by mouth every 6 (six) hours as needed for moderate pain (pain score 4-6).     Not Delegated - Analgesics:  Opioid Agonist Combinations Failed - 04/30/2023  8:47 AM      Failed - This refill cannot be delegated      Failed - Urine Drug Screen completed in last 360 days      Failed - Valid encounter within last 3 months    Recent Outpatient Visits           2 years ago Acute exacerbation of chronic bronchitis (HCC)   [facility] [name], NP   2 years ago Chronic pain of left knee   [facility] Donita Brooks, MD   2 years ago Chronic pain of left knee   [facility] Donita Brooks, MD   3 years ago Herpes zoster without complication   [facility] Pickard, Priscille Heidelberg, MD   3 years ago Pure hypercholesterolemia   [facility] Pickard, Priscille Heidelberg, MD               montelukast (SINGULAIR) 10 MG tablet 90 tablet 1    Sig: Take 1 tablet (10 mg total) by mouth daily.     Pulmonology:  Leukotriene Inhibitors Failed - 04/30/2023  8:47 AM      Failed - Valid encounter within last 12 months    Recent Outpatient Visits           2 years ago Acute exacerbation of chronic bronchitis (HCC)   [facility] [name], NP   2 years ago Chronic pain of left knee   [facility] Donita Brooks, MD   2 years ago Chronic pain of left knee   [facility] Donita Brooks, MD   3 years ago Herpes zoster without complication   [facility] Pickard, Priscille Heidelberg, MD   3 years ago Pure hypercholesterolemia   [facility] Pickard, Priscille Heidelberg, MD

## 2023-05-01 MED ORDER — HYDROCODONE-ACETAMINOPHEN 7.5-325 MG PO TABS: 1.0000 | ORAL_TABLET | Freq: Four times a day (QID) | ORAL | 0 refills | Status: DC | PRN

## 2023-05-03 ENCOUNTER — Other Ambulatory Visit: Payer: Self-pay | Admitting: Hematology and Oncology

## 2023-06-06 ENCOUNTER — Other Ambulatory Visit: Payer: Self-pay | Admitting: Family Medicine

## 2023-06-06 NOTE — Telephone Encounter (Signed)
 Last OV 11/08/22 Requested Prescriptions  Pending Prescriptions Disp Refills   omeprazole (PRILOSEC) 20 MG capsule [Pharmacy Med Name: OMEPRAZOLE DR 20 MG CAPSULE] 180 capsule 1    Sig: TAKE 1 CAPSULE (20 MG TOTAL) BY MOUTH 2 (TWO) TIMES DAILY BEFORE A MEAL.     Gastroenterology: Proton Pump Inhibitors Failed - 06/06/2023 12:25 PM      Failed - Valid encounter within last 12 months    Recent Outpatient Visits           2 years ago Acute exacerbation of chronic bronchitis (HCC)   [facility] [name], NP   2 years ago Chronic pain of left knee   [facility] Donita Brooks, MD   2 years ago Chronic pain of left knee   [facility] Donita Brooks, MD   3 years ago Herpes zoster without complication   [facility] Pickard, Priscille Heidelberg, MD   3 years ago Pure hypercholesterolemia   [facility] Pickard, Priscille Heidelberg, MD

## 2023-06-09 ENCOUNTER — Telehealth: Payer: Self-pay | Admitting: Family Medicine

## 2023-06-09 NOTE — Telephone Encounter (Signed)
 Prescription Request  06/09/2023  LOV: 11/08/2022  What is the name of the medication or equipment?   traZODone (DESYREL) 50 MG tablet   Have you contacted your pharmacy to request a refill? Yes   Which pharmacy would you like this sent to?  [id number] [place], [place] - [address] [place] [address] Phone: [phone] Fax: [phone]    Patient notified that their request is being sent to the clinical staff for review and that they should receive a response within 2 business days.   Please advise pharmacist.

## 2023-06-10 ENCOUNTER — Other Ambulatory Visit: Payer: Self-pay | Admitting: Family Medicine

## 2023-06-10 MED ORDER — TRAZODONE HCL 50 MG PO TABS: 50.0000 mg | ORAL_TABLET | Freq: Every evening | ORAL | 3 refills | Status: AC | PRN

## 2023-06-24 ENCOUNTER — Ambulatory Visit: Payer: Self-pay

## 2023-06-24 ENCOUNTER — Other Ambulatory Visit: Payer: Self-pay

## 2023-06-24 ENCOUNTER — Telehealth: Payer: Self-pay | Admitting: Family Medicine

## 2023-06-24 ENCOUNTER — Other Ambulatory Visit: Payer: Self-pay | Admitting: Family Medicine

## 2023-06-24 DIAGNOSIS — I10 Essential (primary) hypertension: Secondary | ICD-10-CM

## 2023-06-24 MED ORDER — LOSARTAN POTASSIUM 50 MG PO TABS: 50.0000 mg | ORAL_TABLET | Freq: Every day | ORAL | 1 refills | Status: DC

## 2023-06-24 MED ORDER — HYDROCODONE-ACETAMINOPHEN 7.5-325 MG PO TABS: 1.0000 | ORAL_TABLET | Freq: Four times a day (QID) | ORAL | 0 refills | Status: DC | PRN

## 2023-06-24 NOTE — Telephone Encounter (Signed)
  Chief Complaint: swollen lymph node Symptoms: swelling Frequency: Constant Pertinent Negatives: Patient denies difficulty swallowing, difficulty breathing, fever, pain Disposition: [] ED /[] Urgent Care (no appt availability in office) / [x] Appointment(In office/virtual)/ []  [place] [facility]/ [] Home Care/ [] Refused Recommended Disposition /[]  Mobile Bus/ []  Follow-up with PCP Additional Notes:  Has a "knot in her throat", right sided. Size of coin-quarter. Denies all other symptoms. Also requests [facility]codone [facility].   Copied from CRM [id number]. Topic: Clinical - Red Word Triage >> Jun 24, 2023 10:41 AM Fuller Mandril wrote: Red Word that prompted transfer to Nurse Triage: Throat swelling Reason for Disposition  Normal-sized lymph node (i.e., < 1 cm, <1/2 in)  Protocols used: Lymph Nodes - Swollen-A-AH

## 2023-06-24 NOTE — Telephone Encounter (Signed)
 Prescription Request  06/24/2023  LOV: 11/08/2022  What is the name of the medication or equipment?   losartan (COZAAR) 50 MG tablet   Have you contacted your pharmacy to request a refill? Yes   Which pharmacy would you like this sent to?  [id number] [place], [place] - [address] [place] [address] Phone: [phone] Fax: [phone]    Patient notified that their request is being sent to the clinical staff for review and that they should receive a response within 2 business days.   Please advise pharmacist

## 2023-07-03 ENCOUNTER — Ambulatory Visit: Admitting: Family Medicine

## 2023-07-03 ENCOUNTER — Encounter: Payer: Self-pay | Admitting: Family Medicine

## 2023-07-03 VITALS — BP 120/72 | HR 75 | Temp 98.1°F | Ht 62.0 in | Wt 203.4 lb

## 2023-07-03 DIAGNOSIS — R590 Localized enlarged lymph nodes: Secondary | ICD-10-CM

## 2023-07-03 LAB — CBC WITH DIFFERENTIAL/PLATELET
Absolute Lymphocytes: 1386 {cells}/uL (ref 850–3900)
Absolute Monocytes: 570 {cells}/uL (ref 200–950)
Basophils Absolute: 72 {cells}/uL (ref 0–200)
Basophils Relative: 1.2 %
Eosinophils Absolute: 150 {cells}/uL (ref 15–500)
Eosinophils Relative: 2.5 %
HCT: 40.6 % (ref 35.0–45.0)
Hemoglobin: 13.5 g/dL (ref 11.7–15.5)
MCH: 34.1 pg — ABNORMAL HIGH (ref 27.0–33.0)
MCHC: 33.3 g/dL (ref 32.0–36.0)
MCV: 102.5 fL — ABNORMAL HIGH (ref 80.0–100.0)
MPV: 10.3 fL (ref 7.5–12.5)
Monocytes Relative: 9.5 %
Neutro Abs: 3822 {cells}/uL (ref 1500–7800)
Neutrophils Relative %: 63.7 %
Platelets: 151 10*3/uL (ref 140–400)
RBC: 3.96 10*6/uL (ref 3.80–5.10)
RDW: 12.3 % (ref 11.0–15.0)
Total Lymphocyte: 23.1 %
WBC: 6 10*3/uL (ref 3.8–10.8)

## 2023-07-03 NOTE — Progress Notes (Signed)
 Subjective:    Patient ID: Christina Lyons, female    DOB: Aug 04, 1940, 83 y.o.   MRN: [id number]  HPI Patient reports feeling a lump on the right side of her neck.  Just above the clavicle, on the right anterior portion of the sternocleidomastoid muscle there is a small 1 cm lymph node.  Is not tender.  Is not painful.  There is no erythema.  It does not appear enlarged.  There is no other lymphadenopathy in the neck.  The patient denies any fevers or chills or fatigue or night sweats. Past Medical History:  Diagnosis Date   DDD (degenerative disc disease), lumbar    Family history of breast cancer 08/16/2020   GERD (gastroesophageal reflux disease)    History of bronchitis    Hyperlipidemia    Hypertension    Osteomyelitis (HCC) 1946   both legs   Osteoporosis    Other idiopathic scoliosis, thoracolumbar region    Past Surgical History:  Procedure Laterality Date   ABDOMINAL HYSTERECTOMY     BREAST LUMPECTOMY Right 09/12/2020   Procedure: RIGHT BREAST LUMPECTOMY;  Surgeon: Almond Lint, MD;  Location: [facility] OR;  Service: General;  Laterality: Right;   CHOLECYSTECTOMY     JOINT REPLACEMENT     right knee   Current Outpatient Medications on File Prior to Visit  Medication Sig Dispense Refill   albuterol (VENTOLIN HFA) 108 (90 Base) MCG/ACT inhaler SMARTSIG:1 Puff(s) Via Inhaler 4 Times Daily PRN     amoxicillin-clavulanate (AUGMENTIN) 875-125 MG tablet Take 1 tablet by mouth 2 (two) times daily. 20 tablet 0   Cholecalciferol (VITAMIN D) 50 MCG (2000 UT) CAPS Take 4,000 Units by mouth daily.     gabapentin (NEURONTIN) 300 MG capsule Take 300 mg by mouth 2 (two) times daily.     HYDROcodone-acetaminophen (NORCO) 7.5-325 MG tablet Take 1 tablet by mouth every 6 (six) hours as needed for moderate pain (pain score 4-6). 30 tablet 0   HYDROcodone-acetaminophen (NORCO) 7.5-325 MG tablet Take 1 tablet by mouth every 6 (six) hours as needed for moderate pain (pain score 4-6). 30 tablet 0    letrozole (FEMARA) 2.5 MG tablet TAKE 1 TABLET BY MOUTH EVERY DAY 90 tablet 3   LORazepam (ATIVAN) 1 MG tablet Take 1 tablet (1 mg total) by mouth 2 (two) times daily as needed for anxiety. 60 tablet 0   losartan (COZAAR) 50 MG tablet Take 1 tablet (50 mg total) by mouth daily. 90 tablet 1   montelukast (SINGULAIR) 10 MG tablet Take 1 tablet (10 mg total) by mouth daily. 90 tablet 1   Multiple Vitamin (MULTIVITAMIN WITH MINERALS) TABS tablet Take 1 tablet by mouth daily with supper.     omeprazole (PRILOSEC) 20 MG capsule TAKE 1 CAPSULE (20 MG TOTAL) BY MOUTH 2 (TWO) TIMES DAILY BEFORE A MEAL. 180 capsule 1   rosuvastatin (CRESTOR) 10 MG tablet Take 1 tablet (10 mg total) by mouth daily. 90 tablet 1   sennosides-docusate sodium (SENOKOT-S) 8.6-50 MG tablet Take 1 tablet by mouth daily as needed for constipation.     traZODone (DESYREL) 50 MG tablet Take 1 tablet (50 mg total) by mouth at bedtime as needed for sleep. 90 tablet 3   triamcinolone (NASACORT) 55 MCG/ACT AERO nasal inhaler Place 2 sprays into the nose daily. 1 each 12   venlafaxine XR (EFFEXOR-XR) 37.5 MG 24 hr capsule TAKE 1 CAPSULE BY MOUTH DAILY WITH BREAKFAST. 90 capsule 2   zinc gluconate 50 MG tablet  Take 50 mg by mouth daily.     No current facility-administered medications on file prior to visit.   Allergies  Allergen Reactions   Sulfa Antibiotics Hives   Tape Itching    Paper tape   Social History   Socioeconomic History   Marital status: Widowed    Spouse name: Not on file   Number of children: Not on file   Years of education: Not on file   Highest education level: Not on file  Occupational History   Occupation: Retired  Tobacco Use   Smoking status: Former    Current packs/day: 0.00    Average packs/day: 0.5 packs/day for 5.0 years (2.5 ttl pk-yrs)    Types: Cigarettes    Start date: 04/02/1975    Quit date: 04/01/1980    Years since quitting: 43.2   Smokeless tobacco: Never  Vaping Use   Vaping status:  Never Used  Substance and Sexual Activity   Alcohol use: No    Alcohol/week: 0.0 standard drinks of alcohol   Drug use: No   Sexual activity: Not on file  Other Topics Concern   Not on file  Social History Narrative   Not on file   Social Drivers of Health   Financial Resource Strain: Low Risk  (05/30/2022)   Overall Financial Resource Strain (CARDIA)    Difficulty of Paying Living Expenses: Not hard at all  Food Insecurity: No Food Insecurity (05/30/2022)   Hunger Vital Sign    Worried About Running Out of Food in the Last Year: Never true    Ran Out of Food in the Last Year: Never true  Transportation Needs: No Transportation Needs (05/30/2022)   PRAPARE - Administrator, Civil Service (Medical): No    Lack of Transportation (Non-Medical): No  Physical Activity: Inactive (05/30/2022)   Exercise Vital Sign    Days of Exercise per Week: 0 days    Minutes of Exercise per Session: 0 min  Stress: No Stress Concern Present (05/30/2022)   [employer] of Occupational Health - Occupational Stress Questionnaire    Feeling of Stress : Not at all  Social Connections: Moderately Isolated (05/30/2022)   Social Connection and Isolation Panel [NHANES]    Frequency of Communication with Friends and Family: More than three times a week    Frequency of Social Gatherings with Friends and Family: Three times a week    Attends Religious Services: More than 4 times per year    Active Member of Clubs or Organizations: No    Attends Banker Meetings: Never    Marital Status: Widowed  Intimate Partner Violence: Not At Risk (05/30/2022)   Humiliation, Afraid, Rape, and Kick questionnaire    Fear of Current or Ex-Partner: No    Emotionally Abused: No    Physically Abused: No    Sexually Abused: No      Review of Systems  All other systems reviewed and are negative.      Objective:   Physical Exam Vitals reviewed.  Constitutional:      General: She is not in  acute distress.    Appearance: Normal appearance. She is normal weight. She is not ill-appearing or toxic-appearing.  HENT:     Head: Normocephalic and atraumatic.     Right Ear: Tympanic membrane and ear canal normal.     Left Ear: Tympanic membrane and ear canal normal.     Nose: Nose normal. No congestion or rhinorrhea.     Mouth/Throat:  Pharynx: No oropharyngeal exudate or posterior oropharyngeal erythema.  Neck:   Cardiovascular:     Rate and Rhythm: Regular rhythm.     Heart sounds: Normal heart sounds.  Pulmonary:     Effort: Pulmonary effort is normal.     Breath sounds: Normal breath sounds.  Lymphadenopathy:     Cervical: Cervical adenopathy present.  Neurological:     Mental Status: She is alert.           Assessment & Plan:  Enlarged lymph node in neck - Plan: CBC with Differential/Platelet Lymph node is borderline enlarged.  I believe it is benign.  Recommended clinical monitoring at the present time.  If it continues to grow or change we would arrange a biopsy of the lymph node.  Meanwhile check a CBC.  However the patient has no systemic symptoms.  Patient agrees to clinical monitoring at the present time

## 2023-08-27 ENCOUNTER — Ambulatory Visit

## 2023-08-27 VITALS — Ht 62.0 in | Wt 203.0 lb

## 2023-08-27 DIAGNOSIS — Z0000 Encounter for general adult medical examination without abnormal findings: Secondary | ICD-10-CM

## 2023-08-27 NOTE — Progress Notes (Signed)
 Subjective:   Christina Lyons is a 83 y.o. who presents for a Medicare Wellness preventive visit.  As a reminder, Annual Wellness Visits don't include a physical exam, and some assessments may be limited, especially if this visit is performed virtually. We may recommend an in-person follow-up visit with your provider if needed.  Visit Complete: Virtual I connected with  Virgil Griffiths on 08/27/23 by a audio enabled telemedicine application and verified that I am speaking with the correct person using two identifiers.  Patient Location: Home  Provider Location: Home Office  I discussed the limitations of evaluation and management by telemedicine. The patient expressed understanding and agreed to proceed.  Vital Signs: Because this visit was a virtual/telehealth visit, some criteria may be missing or patient reported. Any vitals not documented were not able to be obtained and vitals that have been documented are patient reported.  VideoDeclined- This patient declined Librarian, academic. Therefore the visit was completed with audio only.  Persons Participating in Visit: Patient.  AWV Questionnaire: No: Patient Medicare AWV questionnaire was not completed prior to this visit.  Cardiac Risk Factors include: advanced age (>71men, >26 women);dyslipidemia;hypertension     Objective:     Today's Vitals   08/27/23 1611  Weight: 203 lb (92.1 kg)  Height: 5\' 2"  (1.575 m)   Body mass index is 37.13 kg/m.     08/27/2023    4:20 PM 05/30/2022    7:25 PM 10/05/2020    2:56 PM 09/06/2020    2:39 PM 04/17/2019    2:00 AM  Advanced Directives  Does Patient Have a Medical Advance Directive? No Yes Yes Yes Yes  Type of Advance Directive  Living will;Healthcare Power of [employer] Power of [place];Living will Living will Living will  Does patient want to make changes to medical advance directive?  No - Patient declined No - Patient declined No - Patient  declined No - Patient declined  Copy of Healthcare Power of Attorney in Chart?  No - copy requested No - copy requested    Would patient like information on creating a medical advance directive? Yes (MAU/Ambulatory/Procedural Areas - Information given)        Current Medications (verified) Outpatient Encounter Medications as of 08/27/2023  Medication Sig   albuterol  (VENTOLIN  HFA) 108 (90 Base) MCG/ACT inhaler SMARTSIG:1 Puff(s) Via Inhaler 4 Times Daily PRN   amoxicillin -clavulanate (AUGMENTIN ) 875-125 MG tablet Take 1 tablet by mouth 2 (two) times daily.   Cholecalciferol (VITAMIN D ) 50 MCG (2000 UT) CAPS Take 4,000 Units by mouth daily.   gabapentin  (NEURONTIN ) 300 MG capsule Take 300 mg by mouth 2 (two) times daily.   HYDROcodone -acetaminophen  (NORCO) 7.5-325 MG tablet Take 1 tablet by mouth every 6 (six) hours as needed for moderate pain (pain score 4-6).   HYDROcodone -acetaminophen  (NORCO) 7.5-325 MG tablet Take 1 tablet by mouth every 6 (six) hours as needed for moderate pain (pain score 4-6).   letrozole  (FEMARA ) 2.5 MG tablet TAKE 1 TABLET BY MOUTH EVERY DAY   LORazepam  (ATIVAN ) 1 MG tablet Take 1 tablet (1 mg total) by mouth 2 (two) times daily as needed for anxiety.   losartan  (COZAAR ) 50 MG tablet Take 1 tablet (50 mg total) by mouth daily.   montelukast  (SINGULAIR ) 10 MG tablet Take 1 tablet (10 mg total) by mouth daily.   Multiple Vitamin (MULTIVITAMIN WITH MINERALS) TABS tablet Take 1 tablet by mouth daily with supper.   omeprazole  (PRILOSEC) 20 MG capsule TAKE  1 CAPSULE (20 MG TOTAL) BY MOUTH 2 (TWO) TIMES DAILY BEFORE A MEAL.   rosuvastatin  (CRESTOR ) 10 MG tablet Take 1 tablet (10 mg total) by mouth daily.   sennosides-docusate sodium (SENOKOT-S) 8.6-50 MG tablet Take 1 tablet by mouth daily as needed for constipation.   traZODone  (DESYREL ) 50 MG tablet Take 1 tablet (50 mg total) by mouth at bedtime as needed for sleep.   triamcinolone  (NASACORT ) 55 MCG/ACT AERO nasal inhaler  Place 2 sprays into the nose daily.   venlafaxine  XR (EFFEXOR -XR) 37.5 MG 24 hr capsule TAKE 1 CAPSULE BY MOUTH DAILY WITH BREAKFAST.   zinc  gluconate 50 MG tablet Take 50 mg by mouth daily.   No facility-administered encounter medications on file as of 08/27/2023.    Allergies (verified) Sulfa antibiotics and Tape   History: Past Medical History:  Diagnosis Date   DDD (degenerative disc disease), lumbar    Family history of breast cancer 08/16/2020   GERD (gastroesophageal reflux disease)    History of bronchitis    Hyperlipidemia    Hypertension    Osteomyelitis (HCC) 1946   both legs   Osteoporosis    Other idiopathic scoliosis, thoracolumbar region    Past Surgical History:  Procedure Laterality Date   ABDOMINAL HYSTERECTOMY     BREAST LUMPECTOMY Right 09/12/2020   Procedure: RIGHT BREAST LUMPECTOMY;  Surgeon: Lockie Rima, MD;  Location: [facility] OR;  Service: General;  Laterality: Right;   CHOLECYSTECTOMY     JOINT REPLACEMENT     right knee   Family History  Problem Relation Age of Onset   Asthma Mother    Asthma Sister    Heart failure Father    Breast cancer Sister        contralateral; dx 81; dx 63   Breast cancer Maternal Aunt 10   Cancer Paternal Aunt        unknown "female" cancer; dx before 60   Throat cancer Paternal Uncle        dx after 75   Social History   Socioeconomic History   Marital status: Widowed    Spouse name: Not on file   Number of children: Not on file   Years of education: Not on file   Highest education level: Not on file  Occupational History   Occupation: Retired  Tobacco Use   Smoking status: Former    Current packs/day: 0.00    Average packs/day: 0.5 packs/day for 5.0 years (2.5 ttl pk-yrs)    Types: Cigarettes    Start date: 04/02/1975    Quit date: 04/01/1980    Years since quitting: 43.4   Smokeless tobacco: Never  Vaping Use   Vaping status: Never Used  Substance and Sexual Activity   Alcohol  use: No    Alcohol /week:  0.0 standard drinks of alcohol    Drug use: No   Sexual activity: Not on file  Other Topics Concern   Not on file  Social History Narrative   Not on file   Social Drivers of Health   Financial Resource Strain: Low Risk  (08/27/2023)   Overall Financial Resource Strain (CARDIA)    Difficulty of Paying Living Expenses: Not hard at all  Food Insecurity: No Food Insecurity (08/27/2023)   Hunger Vital Sign    Worried About Running Out of Food in the Last Year: Never true    Ran Out of Food in the Last Year: Never true  Transportation Needs: No Transportation Needs (08/27/2023)   PRAPARE - Transportation  Lack of Transportation (Medical): No    Lack of Transportation (Non-Medical): No  Physical Activity: Inactive (08/27/2023)   Exercise Vital Sign    Days of Exercise per Week: 0 days    Minutes of Exercise per Session: 0 min  Stress: No Stress Concern Present (08/27/2023)   [employer] of Occupational Health - Occupational Stress Questionnaire    Feeling of Stress : Not at all  Social Connections: Moderately Isolated (08/27/2023)   Social Connection and Isolation Panel [NHANES]    Frequency of Communication with Friends and Family: More than three times a week    Frequency of Social Gatherings with Friends and Family: Three times a week    Attends Religious Services: More than 4 times per year    Active Member of Clubs or Organizations: No    Attends Banker Meetings: Never    Marital Status: Widowed    Tobacco Counseling Counseling given: Not Answered    Clinical Intake:  Pre-visit preparation completed: Yes  Pain : No/denies pain  Diabetes: No   How often do you need to have someone help you when you read instructions, pamphlets, or other written materials from your doctor or pharmacy?: 1 - Never  Interpreter Needed?: No  Information entered by :: Seabron Cypress LPN   Activities of Daily Living     08/27/2023    4:19 PM  In your present state of  health, do you have any difficulty performing the following activities:  Hearing? 0  Vision? 0  Difficulty concentrating or making decisions? 0  Walking or climbing stairs? 0  Dressing or bathing? 0  Doing errands, shopping? 0  Preparing Food and eating ? N  Using the Toilet? N  In the past six months, have you accidently leaked urine? N  Do you have problems with loss of bowel control? N  Managing your Medications? N  Managing your Finances? N  Housekeeping or managing your Housekeeping? N    Patient Care Team: Austine Lefort, MD as PCP - General (Family Medicine) Lockie Rima, MD as Consulting Physician (General Surgery) Cameron Cea, MD as Consulting Physician (Hematology and Oncology) Colie Dawes, MD as Attending Physician (Radiation Oncology) Debbie Fails Laura Polio, NP as Nurse Practitioner (Hematology and Oncology) Neda Balk, RN as Registered Nurse Pauline Bos, OD as Referring Physician (Optometry) Wes Hamman, MD as Attending Physician (Orthopedic Surgery) Young Hensen, MD as Consulting Physician (Vascular Surgery)  Indicate any recent Medical Services you may have received from other than [facility] providers in the past year (date may be approximate).     Assessment:    This is a routine wellness examination for [place].  Hearing/Vision screen Hearing Screening - Comments:: Denies hearing difficulties   Vision Screening - Comments:: Wears rx glasses - up to date with routine eye exams   Goals Addressed             This Visit's Progress    Prevent falls   On track      Depression Screen     08/27/2023    4:15 PM 03/12/2023    1:57 PM 11/08/2022    3:27 PM 05/30/2022    7:23 PM 01/08/2021    3:08 PM 10/05/2020    2:59 PM 06/08/2020   12:33 PM  PHQ 2/9 Scores  PHQ - 2 Score 0 0  0 0 0 0  PHQ- 9 Score  0       Exception Documentation   Patient refusal  Fall Risk     08/27/2023    4:19 PM 03/12/2023    1:57 PM 11/08/2022    3:27 PM  05/30/2022    2:56 PM 10/05/2020    2:57 PM  Fall Risk   Falls in the past year? 0 0 1 1 1   Number falls in past yr: 0 0 0 1 1  Injury with Fall? 0 0 0 0 0  Risk for fall due to : Impaired mobility  History of fall(s);Impaired balance/gait;Impaired mobility History of fall(s);Impaired mobility Impaired balance/gait  Follow up Falls prevention discussed;Education provided;Falls evaluation completed  Education provided;Falls prevention discussed;Falls evaluation completed Education provided;Falls prevention discussed;Falls evaluation completed Falls evaluation completed;Falls prevention discussed    MEDICARE RISK AT HOME:  Medicare Risk at Home Any stairs in or around the home?: No If so, are there any without handrails?: No Home free of loose throw rugs in walkways, pet beds, electrical cords, etc?: Yes Adequate lighting in your home to reduce risk of falls?: Yes Life alert?: No Use of a cane, walker or w/c?: Yes Grab bars in the bathroom?: Yes Shower chair or bench in shower?: No Elevated toilet seat or a handicapped toilet?: Yes  TIMED UP AND GO:  Was the test performed?  No  Cognitive Function: 6CIT completed        08/27/2023    4:20 PM 05/30/2022    7:27 PM  6CIT Screen  What Year? 0 points 0 points  What month? 0 points 0 points  What time? 0 points 0 points  Count back from 20 0 points 0 points  Months in reverse 0 points 0 points  Repeat phrase 0 points 0 points  Total Score 0 points 0 points    Immunizations Immunization History  Administered Date(s) Administered   Fluad Quad(high Dose 65+) 12/29/2018   Influenza, High Dose Seasonal PF 01/05/2018   Influenza,inj,Quad PF,6+ Mos 01/17/2014, 01/11/2015   Influenza-Unspecified 03/05/2011, 12/30/2011, 01/30/2013, 02/08/2016, 05/01/2017, 03/29/2020   Pneumococcal Conjugate-13 03/01/2014   Pneumococcal Polysaccharide-23 12/10/2010   Tdap 09/19/2018    Screening Tests Health Maintenance  Topic Date Due   COVID-19  Vaccine (1) Never done   Zoster Vaccines- Shingrix (1 of 2) Never done   INFLUENZA VACCINE  10/31/2023   Medicare Annual Wellness (AWV)  08/26/2024   Pneumonia Vaccine 58+ Years old  Completed   DEXA SCAN  Completed   HPV VACCINES  Aged Out   Meningococcal B Vaccine  Aged Out   DTaP/Tdap/Td  Discontinued   Colonoscopy  Discontinued    Health Maintenance  Health Maintenance Due  Topic Date Due   COVID-19 Vaccine (1) Never done   Zoster Vaccines- Shingrix (1 of 2) Never done    Additional Screening:  Vision Screening: Recommended annual ophthalmology exams for early detection of glaucoma and other disorders of the eye.  Dental Screening: Recommended annual dental exams for proper oral hygiene  Community Resource Referral / Chronic Care Management: CRR required this visit?  No   CCM required this visit?  No   Plan:    I have personally reviewed and noted the following in the patient's chart:   Medical and social history Use of alcohol , tobacco or illicit drugs  Current medications and supplements including opioid prescriptions. Patient is not currently taking opioid prescriptions. Functional ability and status Nutritional status Physical activity Advanced directives List of other physicians Hospitalizations, surgeries, and ER visits in previous 12 months Vitals Screenings to include cognitive, depression, and falls Referrals and appointments  In addition, I have reviewed and discussed with patient certain preventive protocols, quality metrics, and best practice recommendations. A written personalized care plan for preventive services as well as general preventive health recommendations were provided to patient.   Seabron Cypress [place], [place]   9/56/2130   After Visit Summary: (MyChart) Due to this being a telephonic visit, the after visit summary with patients personalized plan was offered to patient via MyChart   Notes: Nothing significant to report at this  time.

## 2023-08-27 NOTE — Patient Instructions (Signed)
 Christina Lyons , Thank you for taking time out of your busy schedule to complete your Annual Wellness Visit with me. I enjoyed our conversation and look forward to speaking with you again next year. I, as well as your care team,  appreciate your ongoing commitment to your health goals. Please review the following plan we discussed and let me know if I can assist you in the future. Your Game plan/ To Do List    Follow up Visits: Next Medicare AWV with our clinical staff: In 1 year    Have you seen your provider in the last 6 months (3 months if uncontrolled diabetes)? Yes Next Office Visit with your provider: To be scheduled   Clinician Recommendations:  Aim for 30 minutes of exercise or brisk walking, 6-8 glasses of water, and 5 servings of fruits and vegetables each day.       This is a list of the screening recommended for you and due dates:  Health Maintenance  Topic Date Due   COVID-19 Vaccine (1) Never done   Zoster (Shingles) Vaccine (1 of 2) Never done   Flu Shot  10/31/2023   Medicare Annual Wellness Visit  08/26/2024   Pneumonia Vaccine  Completed   DEXA scan (bone density measurement)  Completed   HPV Vaccine  Aged Out   Meningitis B Vaccine  Aged Out   DTaP/Tdap/Td vaccine  Discontinued   Colon Cancer Screening  Discontinued    Advanced directives: (ACP Link)Information on Advanced Care Planning can be found at [place]  Secretary of [facility] Advance Health Care Directives Advance Health Care Directives. [email or web address]   Advance Care Planning is important because it:  [x]  Makes sure you receive the medical care that is consistent with your values, goals, and preferences  [x]  It provides guidance to your family and loved ones and reduces their decisional burden about whether or not they are making the right decisions based on your wishes.  Follow the link provided in your after visit summary or read over the paperwork we have mailed to you to help you started getting your  Advance Directives in place. If you need assistance in completing these, please reach out to us  so that we can help you!  See attachments for Preventive Care and Fall Prevention Tips.

## 2023-09-23 ENCOUNTER — Other Ambulatory Visit: Payer: Self-pay | Admitting: Hematology and Oncology

## 2023-10-15 ENCOUNTER — Other Ambulatory Visit: Payer: Self-pay | Admitting: Family Medicine

## 2023-10-15 NOTE — Telephone Encounter (Unsigned)
 Copied from CRM [id number]. Topic: Clinical - Medication Refill >> Oct 15, 2023 12:35 PM Rachelle R wrote: Medication:  LORazepam  (ATIVAN ) 1 MG tablet HYDROcodone -acetaminophen  (NORCO) 7.5-325 MG tablet promethazine  (PHENERGAN ) 25 MG tablet  Has the patient contacted their pharmacy? Yes, call dr  This is the patient's preferred pharmacy:  [id number] [place], [place] - [address] [place] [address] Phone: [phone] Fax: [phone]  Is this the correct pharmacy for this prescription? Yes If no, delete pharmacy and type the correct one.   Has the prescription been filled recently? No  Is the patient out of the medication? No, but very low  Has the patient been seen for an appointment in the last year OR does the patient have an upcoming appointment? Yes  Can we respond through MyChart? Yes  Agent: Please be advised that Rx refills may take up to 3 business days. We ask that you follow-up with your pharmacy.

## 2023-10-16 ENCOUNTER — Telehealth: Payer: Self-pay | Admitting: Hematology and Oncology

## 2023-10-16 NOTE — Telephone Encounter (Signed)
 Rescheule appointment per provider pal.  Called left VM with changes made to the upcoming appointment.

## 2023-10-16 NOTE — Telephone Encounter (Signed)
 Requested medications are due for refill today.  yes  Requested medications are on the active medications list.  yes  Last refill. Norco 06/24/2023, Ativan  04/03/2023 #60  Future visit scheduled.   yes  Notes to clinic.  Refills not delegated. Pt has 2 prescriptions for Norco on med list    Requested Prescriptions  Pending Prescriptions Disp Refills   HYDROcodone -acetaminophen  (NORCO) 7.5-325 MG tablet 30 tablet 0    Sig: Take 1 tablet by mouth every 6 (six) hours as needed for moderate pain (pain score 4-6).     Not Delegated - Analgesics:  Opioid Agonist Combinations Failed - 10/16/2023  4:34 PM      Failed - This refill cannot be delegated      Failed - Urine Drug Screen completed in last 360 days      Passed - Valid encounter within last 3 months    Recent Outpatient Visits           3 months ago Enlarged lymph node in neck   [place] [facility] Duanne Butler DASEN, MD   7 months ago Bacterial sinusitis   [place] [facility] Kayla Jeoffrey RAMAN, FNP   11 months ago Murmur   [place] [facility] Pickard, Butler DASEN, MD               LORazepam  (ATIVAN ) 1 MG tablet 60 tablet 0    Sig: Take 1 tablet (1 mg total) by mouth 2 (two) times daily as needed for anxiety.     Not Delegated - Psychiatry: Anxiolytics/Hypnotics 2 Failed - 10/16/2023  4:34 PM      Failed - This refill cannot be delegated      Failed - Urine Drug Screen completed in last 360 days      Passed - Patient is not pregnant      Passed - Valid encounter within last 6 months    Recent Outpatient Visits           3 months ago Enlarged lymph node in neck   [place] [facility] Duanne Butler DASEN, MD   7 months ago Bacterial sinusitis   [place] [facility] Kayla Jeoffrey RAMAN, FNP   11 months ago Murmur   [place] [facility] Pickard, Butler DASEN, MD               HYDROcodone -acetaminophen   (NORCO) 7.5-325 MG tablet 30 tablet 0    Sig: Take 1 tablet by mouth every 6 (six) hours as needed for moderate pain (pain score 4-6).     Not Delegated - Analgesics:  Opioid Agonist Combinations Failed - 10/16/2023  4:34 PM      Failed - This refill cannot be delegated      Failed - Urine Drug Screen completed in last 360 days      Passed - Valid encounter within last 3 months    Recent Outpatient Visits           3 months ago Enlarged lymph node in neck   [place] [facility] Duanne Butler DASEN, MD   7 months ago Bacterial sinusitis   [place] [facility] Kayla Jeoffrey RAMAN, FNP   11 months ago Murmur   [place] [facility] Pickard, Butler DASEN, MD

## 2023-10-17 MED ORDER — LORAZEPAM 1 MG PO TABS: 1.0000 mg | ORAL_TABLET | Freq: Two times a day (BID) | ORAL | 0 refills | Status: DC | PRN

## 2023-10-17 MED ORDER — HYDROCODONE-ACETAMINOPHEN 7.5-325 MG PO TABS: 1.0000 | ORAL_TABLET | Freq: Four times a day (QID) | ORAL | 0 refills | Status: DC | PRN

## 2023-11-20 ENCOUNTER — Ambulatory Visit: Payer: PPO | Admitting: Hematology and Oncology

## 2023-11-25 ENCOUNTER — Inpatient Hospital Stay: Attending: Hematology and Oncology | Admitting: Hematology and Oncology

## 2023-11-25 ENCOUNTER — Other Ambulatory Visit: Payer: Self-pay | Admitting: Family Medicine

## 2023-11-25 ENCOUNTER — Telehealth: Payer: Self-pay | Admitting: *Deleted

## 2023-11-25 NOTE — Telephone Encounter (Signed)
 Called pt to f/u on missed appt with Dr. Gudena. Numbers provided are non-working.

## 2023-11-25 NOTE — Telephone Encounter (Signed)
 Copied from CRM [id number]. Topic: Clinical - Medication Refill >> Nov 25, 2023  2:20 PM Pinkey ORN wrote: Medication: LORazepam  (ATIVAN ) 1 MG tablet, HYDROcodone -acetaminophen  (NORCO) 7.5-325 MG tablet  Has the patient contacted their pharmacy? No (Agent: If no, request that the patient contact the pharmacy for the refill. If patient does not wish to contact the pharmacy document the reason why and proceed with request.) (Agent: If yes, when and what did the pharmacy advise?)  This is the patient's preferred pharmacy:  [id number] [place], [place] - [address] [place] [address] Phone: [phone] Fax: [phone]  Is this the correct pharmacy for this prescription? Yes If no, delete pharmacy and type the correct one.   Has the prescription been filled recently? No  Is the patient out of the medication? No  Has the patient been seen for an appointment in the last year OR does the patient have an upcoming appointment? Yes  Can we respond through MyChart? Yes  Agent: Please be advised that Rx refills may take up to 3 business days. We ask that you follow-up with your pharmacy.

## 2023-11-25 NOTE — Assessment & Plan Note (Deleted)
 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  Right Lumpectomy: 09/14/20: Grade 2 IDC 1.2 cm, Margins Neg, ER 95%, PR 95%, Ki 67 1%, Her-2 Neg 3. +/- Adjuvant radiation (patient informed me that she does not want to do radiation).  She has an appointment with radiation oncology coming up. 4.  Adjuvant antiestrogen therapy with Letrozole  2.5 mg daily x5 years started July 2022 Genetic testing 08/30/2020: No pathogenic mutations ------------------------------------------------------------------------------------------------------------------------- Letrozole  toxicities: Denies any hot flashes.   Osteoarthritis of the left knee: Getting injections Major depression: On Effexor .  It is working very well.  She is looking forward to a new day.   Breast cancer surveillance: 1.  Breast exam 11/25/2023: Benign 2. mammogram at [facility] 11/01/2022: Benign breast density category A   Return to clinic in 1 year for follow-up

## 2023-11-26 ENCOUNTER — Other Ambulatory Visit: Payer: Self-pay | Admitting: Family Medicine

## 2023-11-27 MED ORDER — LORAZEPAM 1 MG PO TABS: 1.0000 mg | ORAL_TABLET | Freq: Two times a day (BID) | ORAL | 0 refills | Status: DC | PRN

## 2023-11-27 MED ORDER — HYDROCODONE-ACETAMINOPHEN 7.5-325 MG PO TABS
1.0000 | ORAL_TABLET | Freq: Four times a day (QID) | ORAL | 0 refills | Status: DC | PRN
Start: 2023-11-27 — End: 2023-12-31

## 2023-11-27 NOTE — Telephone Encounter (Signed)
 Requested medications are due for refill today.  yes  Requested medications are on the active medications list.  yes  Last refill. 10/17/2023 30 day supply  Future visit scheduled.   In June 2026  Notes to clinic.  Refills not delegated.    Requested Prescriptions  Pending Prescriptions Disp Refills   LORazepam  (ATIVAN ) 1 MG tablet 60 tablet 0    Sig: Take 1 tablet (1 mg total) by mouth 2 (two) times daily as needed for anxiety.     Not Delegated - Psychiatry: Anxiolytics/Hypnotics 2 Failed - 11/27/2023 10:37 AM      Failed - This refill cannot be delegated      Failed - Urine Drug Screen completed in last 360 days      Passed - Patient is not pregnant      Passed - Valid encounter within last 6 months    Recent Outpatient Visits           4 months ago Enlarged lymph node in neck   [place] [facility] Duanne Butler DASEN, MD   8 months ago Bacterial sinusitis   [facility] Kayla Jeoffrey RAMAN, FNP   1 year ago Murmur   [facility] Pickard, Butler DASEN, MD               HYDROcodone -acetaminophen  (NORCO) 7.5-325 MG tablet 30 tablet 0    Sig: Take 1 tablet by mouth every 6 (six) hours as needed for moderate pain (pain score 4-6).     Not Delegated - Analgesics:  Opioid Agonist Combinations Failed - 11/27/2023 10:37 AM      Failed - This refill cannot be delegated      Failed - Urine Drug Screen completed in last 360 days      Failed - Valid encounter within last 3 months    Recent Outpatient Visits           4 months ago Enlarged lymph node in neck   [place] [facility] Duanne Butler DASEN, MD   8 months ago Bacterial sinusitis   [place] [facility] Kayla Jeoffrey RAMAN, FNP   1 year ago Murmur   [facility] Pickard, Butler DASEN, MD

## 2023-12-25 ENCOUNTER — Inpatient Hospital Stay (HOSPITAL_COMMUNITY)
Admission: EM | Admit: 2023-12-25 | Discharge: 2023-12-31 | DRG: 481 | Disposition: A | Discharge status: Skilled Nursing Facility | Payer: Medicare (Managed Care) | Attending: Internal Medicine | Admitting: Internal Medicine

## 2023-12-25 ENCOUNTER — Other Ambulatory Visit: Payer: Self-pay

## 2023-12-25 ENCOUNTER — Emergency Department (HOSPITAL_COMMUNITY): Payer: Medicare (Managed Care)

## 2023-12-25 ENCOUNTER — Inpatient Hospital Stay (HOSPITAL_COMMUNITY): Payer: Medicare (Managed Care)

## 2023-12-25 DIAGNOSIS — M4125 Other idiopathic scoliosis, thoracolumbar region: Secondary | ICD-10-CM | POA: Diagnosis not present

## 2023-12-25 DIAGNOSIS — M9712XA Periprosthetic fracture around internal prosthetic left knee joint, initial encounter: Secondary | ICD-10-CM | POA: Diagnosis not present

## 2023-12-25 DIAGNOSIS — K5900 Constipation, unspecified: Secondary | ICD-10-CM | POA: Diagnosis not present

## 2023-12-25 DIAGNOSIS — Z90710 Acquired absence of both cervix and uterus: Secondary | ICD-10-CM | POA: Diagnosis not present

## 2023-12-25 DIAGNOSIS — Z882 Allergy status to sulfonamides status: Secondary | ICD-10-CM

## 2023-12-25 DIAGNOSIS — R000 Tachycardia, unspecified: Secondary | ICD-10-CM | POA: Diagnosis not present

## 2023-12-25 DIAGNOSIS — Z79811 Long term (current) use of aromatase inhibitors: Secondary | ICD-10-CM | POA: Diagnosis not present

## 2023-12-25 DIAGNOSIS — S72402A Unspecified fracture of lower end of left femur, initial encounter for closed fracture: Principal | ICD-10-CM | POA: Diagnosis present

## 2023-12-25 DIAGNOSIS — J0100 Acute maxillary sinusitis, unspecified: Secondary | ICD-10-CM

## 2023-12-25 DIAGNOSIS — M79605 Pain in left leg: Secondary | ICD-10-CM | POA: Diagnosis present

## 2023-12-25 DIAGNOSIS — E7800 Pure hypercholesterolemia, unspecified: Secondary | ICD-10-CM | POA: Diagnosis not present

## 2023-12-25 DIAGNOSIS — M62838 Other muscle spasm: Secondary | ICD-10-CM | POA: Diagnosis not present

## 2023-12-25 DIAGNOSIS — Z9049 Acquired absence of other specified parts of digestive tract: Secondary | ICD-10-CM

## 2023-12-25 DIAGNOSIS — I872 Venous insufficiency (chronic) (peripheral): Secondary | ICD-10-CM | POA: Diagnosis not present

## 2023-12-25 DIAGNOSIS — Z751 Person awaiting admission to adequate facility elsewhere: Secondary | ICD-10-CM | POA: Diagnosis not present

## 2023-12-25 DIAGNOSIS — S72452A Displaced supracondylar fracture without intracondylar extension of lower end of left femur, initial encounter for closed fracture: Principal | ICD-10-CM | POA: Diagnosis present

## 2023-12-25 DIAGNOSIS — Z4789 Encounter for other orthopedic aftercare: Secondary | ICD-10-CM | POA: Diagnosis not present

## 2023-12-25 DIAGNOSIS — Z96652 Presence of left artificial knee joint: Secondary | ICD-10-CM | POA: Diagnosis not present

## 2023-12-25 DIAGNOSIS — C50919 Malignant neoplasm of unspecified site of unspecified female breast: Secondary | ICD-10-CM | POA: Diagnosis not present

## 2023-12-25 DIAGNOSIS — M51369 Other intervertebral disc degeneration, lumbar region without mention of lumbar back pain or lower extremity pain: Secondary | ICD-10-CM | POA: Diagnosis not present

## 2023-12-25 DIAGNOSIS — Z825 Family history of asthma and other chronic lower respiratory diseases: Secondary | ICD-10-CM

## 2023-12-25 DIAGNOSIS — Y9301 Activity, walking, marching and hiking: Secondary | ICD-10-CM | POA: Diagnosis present

## 2023-12-25 DIAGNOSIS — D62 Acute posthemorrhagic anemia: Secondary | ICD-10-CM | POA: Diagnosis not present

## 2023-12-25 DIAGNOSIS — E785 Hyperlipidemia, unspecified: Secondary | ICD-10-CM | POA: Diagnosis not present

## 2023-12-25 DIAGNOSIS — Z79899 Other long term (current) drug therapy: Secondary | ICD-10-CM

## 2023-12-25 DIAGNOSIS — E440 Moderate protein-calorie malnutrition: Secondary | ICD-10-CM | POA: Diagnosis not present

## 2023-12-25 DIAGNOSIS — M810 Age-related osteoporosis without current pathological fracture: Secondary | ICD-10-CM | POA: Diagnosis present

## 2023-12-25 DIAGNOSIS — I10 Essential (primary) hypertension: Secondary | ICD-10-CM | POA: Diagnosis present

## 2023-12-25 DIAGNOSIS — M978XXA Periprosthetic fracture around other internal prosthetic joint, initial encounter: Secondary | ICD-10-CM | POA: Diagnosis not present

## 2023-12-25 DIAGNOSIS — Z6837 Body mass index (BMI) 37.0-37.9, adult: Secondary | ICD-10-CM | POA: Diagnosis not present

## 2023-12-25 DIAGNOSIS — N952 Postmenopausal atrophic vaginitis: Secondary | ICD-10-CM | POA: Diagnosis not present

## 2023-12-25 DIAGNOSIS — W19XXXA Unspecified fall, initial encounter: Secondary | ICD-10-CM | POA: Diagnosis not present

## 2023-12-25 DIAGNOSIS — R350 Frequency of micturition: Secondary | ICD-10-CM | POA: Diagnosis not present

## 2023-12-25 DIAGNOSIS — E669 Obesity, unspecified: Secondary | ICD-10-CM | POA: Diagnosis present

## 2023-12-25 DIAGNOSIS — Z170 Estrogen receptor positive status [ER+]: Secondary | ICD-10-CM | POA: Diagnosis not present

## 2023-12-25 DIAGNOSIS — S72002A Fracture of unspecified part of neck of left femur, initial encounter for closed fracture: Secondary | ICD-10-CM | POA: Diagnosis not present

## 2023-12-25 DIAGNOSIS — Y92012 Bathroom of single-family (private) house as the place of occurrence of the external cause: Secondary | ICD-10-CM

## 2023-12-25 DIAGNOSIS — W010XXA Fall on same level from slipping, tripping and stumbling without subsequent striking against object, initial encounter: Secondary | ICD-10-CM | POA: Diagnosis present

## 2023-12-25 DIAGNOSIS — Z803 Family history of malignant neoplasm of breast: Secondary | ICD-10-CM

## 2023-12-25 DIAGNOSIS — S0990XA Unspecified injury of head, initial encounter: Secondary | ICD-10-CM | POA: Diagnosis not present

## 2023-12-25 DIAGNOSIS — F411 Generalized anxiety disorder: Secondary | ICD-10-CM | POA: Diagnosis not present

## 2023-12-25 DIAGNOSIS — Z9181 History of falling: Secondary | ICD-10-CM | POA: Diagnosis not present

## 2023-12-25 DIAGNOSIS — Z8249 Family history of ischemic heart disease and other diseases of the circulatory system: Secondary | ICD-10-CM | POA: Diagnosis not present

## 2023-12-25 DIAGNOSIS — J449 Chronic obstructive pulmonary disease, unspecified: Secondary | ICD-10-CM | POA: Diagnosis not present

## 2023-12-25 DIAGNOSIS — Z808 Family history of malignant neoplasm of other organs or systems: Secondary | ICD-10-CM

## 2023-12-25 DIAGNOSIS — C50411 Malignant neoplasm of upper-outer quadrant of right female breast: Secondary | ICD-10-CM | POA: Diagnosis not present

## 2023-12-25 DIAGNOSIS — K219 Gastro-esophageal reflux disease without esophagitis: Secondary | ICD-10-CM | POA: Diagnosis not present

## 2023-12-25 DIAGNOSIS — Z87891 Personal history of nicotine dependence: Secondary | ICD-10-CM

## 2023-12-25 DIAGNOSIS — Z96651 Presence of right artificial knee joint: Secondary | ICD-10-CM | POA: Diagnosis not present

## 2023-12-25 DIAGNOSIS — F419 Anxiety disorder, unspecified: Secondary | ICD-10-CM | POA: Diagnosis not present

## 2023-12-25 DIAGNOSIS — S80912A Unspecified superficial injury of left knee, initial encounter: Secondary | ICD-10-CM | POA: Diagnosis not present

## 2023-12-25 LAB — BASIC METABOLIC PANEL WITH GFR
Anion gap: 12 (ref 5–15)
BUN: 14 mg/dL (ref 8–23)
CO2: 25 mmol/L (ref 22–32)
Calcium: 9.3 mg/dL (ref 8.9–10.3)
Chloride: 102 mmol/L (ref 98–111)
Creatinine, Ser: 0.56 mg/dL (ref 0.44–1.00)
GFR, Estimated: >60 mL/min (ref >60)
Glucose, Bld: 92 mg/dL (ref 70–99)
Potassium: 4.5 mmol/L (ref 3.5–5.1)
Sodium: 139 mmol/L (ref 135–145)

## 2023-12-25 LAB — CBC WITH DIFFERENTIAL/PLATELET
Abs Immature Granulocytes: 0.03 K/uL (ref 0.00–0.07)
Basophils Absolute: 0 K/uL (ref 0.0–0.1)
Basophils Relative: 0 %
Eosinophils Absolute: 0.1 K/uL (ref 0.0–0.5)
Eosinophils Relative: 1 %
HCT: 42.4 % (ref 36.0–46.0)
Hemoglobin: 13.9 g/dL (ref 12.0–15.0)
Immature Granulocytes: 0 %
Lymphocytes Relative: 11 %
Lymphs Abs: 1 K/uL (ref 0.7–4.0)
MCH: 34.1 pg — ABNORMAL HIGH (ref 26.0–34.0)
MCHC: 32.8 g/dL (ref 30.0–36.0)
MCV: 103.9 fL — ABNORMAL HIGH (ref 80.0–100.0)
Monocytes Absolute: 0.6 K/uL (ref 0.1–1.0)
Monocytes Relative: 7 %
Neutro Abs: 7.3 K/uL (ref 1.7–7.7)
Neutrophils Relative %: 81 %
Platelets: 153 K/uL (ref 150–400)
RBC: 4.08 MIL/uL (ref 3.87–5.11)
RDW: 12.9 % (ref 11.5–15.5)
WBC: 9 K/uL (ref 4.0–10.5)
nRBC: 0 % (ref 0.0–0.2)

## 2023-12-25 MED ORDER — METHOCARBAMOL 1000 MG/10ML IJ SOLN: 500.0000 mg | Freq: Four times a day (QID) | INTRAMUSCULAR | Status: DC | PRN

## 2023-12-25 MED ORDER — HYDROCODONE-ACETAMINOPHEN 5-325 MG PO TABS
1.0000 | ORAL_TABLET | Freq: Four times a day (QID) | ORAL | Status: DC | PRN
  Administered 2023-12-25 – 2023-12-26 (×2): 2 via ORAL
  Filled 2023-12-25 (×2): qty 2

## 2023-12-25 MED ORDER — SODIUM CHLORIDE 0.9 % IV BOLUS
500.0000 mL | Freq: Once | INTRAVENOUS | Status: AC
  Administered 2023-12-25: 500 mL via INTRAVENOUS

## 2023-12-25 MED ORDER — MORPHINE SULFATE (PF) 4 MG/ML IV SOLN
4.0000 mg | Freq: Once | INTRAVENOUS | Status: AC
  Administered 2023-12-25: 4 mg via INTRAVENOUS
  Filled 2023-12-25: qty 1

## 2023-12-25 MED ORDER — METHOCARBAMOL 500 MG PO TABS
500.0000 mg | ORAL_TABLET | Freq: Four times a day (QID) | ORAL | Status: DC | PRN
  Administered 2023-12-30 – 2023-12-31 (×3): 500 mg via ORAL
  Filled 2023-12-25 (×3): qty 1

## 2023-12-25 MED ORDER — MORPHINE SULFATE (PF) 2 MG/ML IV SOLN
0.5000 mg | INTRAVENOUS | Status: DC | PRN
  Administered 2023-12-26 (×2): 0.5 mg via INTRAVENOUS
  Filled 2023-12-25 (×2): qty 1

## 2023-12-25 MED ORDER — PANTOPRAZOLE SODIUM 40 MG PO TBEC
40.0000 mg | DELAYED_RELEASE_TABLET | Freq: Every day | ORAL | Status: DC
  Administered 2023-12-27 – 2023-12-31 (×5): 40 mg via ORAL
  Filled 2023-12-25 (×6): qty 1

## 2023-12-25 NOTE — ED Triage Notes (Signed)
 Patient presents from home due to a trip and fall on her left knee. Left knee is now swollen, tender, painful and deformed. She also hit her head, but did not lose consciousness and is not on a blood thinner. Hematoma seen on the right side of her head.      EMS vitals: 180/90 BP 90 HR 99% SPO2 on room air

## 2023-12-25 NOTE — ED Provider Notes (Signed)
 [place] EMERGENCY DEPARTMENT AT [facility] Provider Note   CSN: [id number] Arrival date & time: 12/25/23  1606     Patient presents with: No chief complaint on file.   Christina Lyons is a 83 y.o. female.   The history is provided by the patient, the EMS personnel and medical records. No language interpreter was used.     83 year old female with history of degenerative disc disease, osteoporosis, hypertension, scoliosis, brought here via EMS from home for evaluation of a fall.  Patient states a few hours ago she was walking out from her bathroom after fixing her hair and about to go to bingo when her foot got caught on the edge of the door and she lost balance.  States she fell striking the right side of her head against a wooden beam and fell to the ground.  She felt a pop followed by severe pain to her left knee.  She had to roll to her side and felt some pain to her left hip as well.  Her son was there and he contacted EMS and patient was brought here.  She has not received any pain medication.  She states when her knees not moving her pain is mild but does endorse intense pain with any kind of movement to her left lower extremity.  She denies any severe headache neck pain nausea confusion chest pain trouble breathing lightheadedness or dizziness.  She is not on any blood thinner medication.  She denies any precipitating symptoms prior to the fall.  No ankle or foot pain.  Patient normally walks unassisted.  She also mention having a left knee replacement last year that was done by orthopedist Dr. Rubie.  Prior to Admission medications   Medication Sig Start Date End Date Taking? Authorizing Provider  albuterol  (VENTOLIN  HFA) 108 (90 Base) MCG/ACT inhaler SMARTSIG:1 Puff(s) Via Inhaler 4 Times Daily PRN 11/08/22   [provider]  amoxicillin -clavulanate (AUGMENTIN ) 875-125 MG tablet Take 1 tablet by mouth 2 (two) times daily. 03/12/23   Kayla Jeoffrey RAMAN, FNP   Cholecalciferol (VITAMIN D ) 50 MCG (2000 UT) CAPS Take 4,000 Units by mouth daily.    [provider]  gabapentin  (NEURONTIN ) 300 MG capsule Take 300 mg by mouth 2 (two) times daily. 02/26/19   [provider]  HYDROcodone -acetaminophen  (NORCO) 7.5-325 MG tablet Take 1 tablet by mouth every 6 (six) hours as needed for moderate pain (pain score 4-6). 10/17/23   Duanne Butler DASEN, MD  HYDROcodone -acetaminophen  (NORCO) 7.5-325 MG tablet Take 1 tablet by mouth every 6 (six) hours as needed for moderate pain (pain score 4-6). 11/27/23   Duanne Butler DASEN, MD  letrozole  (FEMARA ) 2.5 MG tablet TAKE 1 TABLET BY MOUTH EVERY DAY 09/23/23   Odean Potts, MD  LORazepam  (ATIVAN ) 1 MG tablet Take 1 tablet (1 mg total) by mouth 2 (two) times daily as needed for anxiety. 11/27/23   Duanne Butler DASEN, MD  losartan  (COZAAR ) 50 MG tablet Take 1 tablet (50 mg total) by mouth daily. 06/24/23   Duanne Butler DASEN, MD  montelukast  (SINGULAIR ) 10 MG tablet Take 1 tablet (10 mg total) by mouth daily. 04/30/23   Duanne Butler DASEN, MD  Multiple Vitamin (MULTIVITAMIN WITH MINERALS) TABS tablet Take 1 tablet by mouth daily with supper.    [provider]  omeprazole  (PRILOSEC) 20 MG capsule TAKE 1 CAPSULE (20 MG TOTAL) BY MOUTH 2 (TWO) TIMES DAILY BEFORE A MEAL. 06/06/23   Duanne Butler DASEN, MD  rosuvastatin  (CRESTOR ) 10 MG tablet Take 1 tablet (10 mg total) by mouth daily. 01/31/23   Duanne Butler DASEN, MD  sennosides-docusate sodium  (SENOKOT-S) 8.6-50 MG tablet Take 1 tablet by mouth daily as needed for constipation.    [provider]  traZODone  (DESYREL ) 50 MG tablet Take 1 tablet (50 mg total) by mouth at bedtime as needed for sleep. 06/10/23   Duanne Butler DASEN, MD  triamcinolone  (NASACORT ) 55 MCG/ACT AERO nasal inhaler Place 2 sprays into the nose daily. 11/08/22   Duanne Butler DASEN, MD  venlafaxine  XR (EFFEXOR -XR) 37.5 MG 24 hr capsule TAKE 1 CAPSULE BY MOUTH DAILY WITH BREAKFAST. 05/05/23   Odean Potts, MD  zinc  gluconate 50 MG tablet Take 50 mg by mouth daily.    [provider]    Allergies: Sulfa antibiotics and Tape    Review of Systems  Constitutional:  Negative for fever.  Musculoskeletal:  Positive for arthralgias.    Updated Vital Signs BP (!) 176/90 (BP Location: Right Arm)   Pulse 71   Temp 97.9 F (36.6 C) (Oral)   Resp 18   SpO2 97%   Physical Exam Vitals and nursing note reviewed.  Constitutional:      General: She is not in acute distress.    Appearance: She is well-developed.  HENT:     Head: Normocephalic.     Comments: Mild tenderness to right parietal scalp without bruising laceration or changes Eyes:     Conjunctiva/sclera: Conjunctivae normal.  Cardiovascular:     Rate and Rhythm: Normal rate and regular rhythm.     Pulses: Normal pulses.     Heart sounds: Normal heart sounds.  Pulmonary:     Effort: Pulmonary effort is normal.     Breath sounds: No wheezing, rhonchi or rales.  Abdominal:     Palpations: Abdomen is soft.     Tenderness: There is no abdominal tenderness.  Musculoskeletal:        General: Tenderness (Left knee: Tenderness about the knee with decreased range of motion.  Patella displaced) present.     Cervical back: Neck supple.     Comments: Mild tenderness to left hip without any deformity.  No shortening the leg. Left ankle nontender to palpation, DP pulse palpable.  Skin:    Findings: No rash.  Neurological:     Mental Status: She is alert. Mental status is at baseline.  Psychiatric:        Mood and Affect: Mood normal.     (all labs ordered are listed, but only abnormal results are displayed) Labs Reviewed  CBC WITH DIFFERENTIAL/PLATELET - Abnormal; Notable for the following components:      Result Value   MCV 103.9 (*)    MCH 34.1 (*)    All other components within normal limits  BASIC METABOLIC PANEL WITH GFR    EKG: None ED ECG REPORT   Date: 12/25/2023  Rate: 109  Rhythm: sinus  tachycardia  QRS Axis: normal  Intervals: normal  ST/T Wave abnormalities: nonspecific T wave changes  Conduction Disutrbances:none  Narrative Interpretation:   Old EKG Reviewed: unchanged  I have personally reviewed the EKG tracing and agree with the computerized printout as noted.     Radiology: DG Chest Portable 1 View Result Date: 12/25/2023 CLINICAL DATA:  preop EXAM: PORTABLE CHEST 1 VIEW COMPARISON:  Chest x-ray 04/16/2019 FINDINGS: The heart and mediastinal contours are within normal limits. Elevated left hemidiaphragm. No focal consolidation. No pulmonary edema. No pleural effusion. No  pneumothorax. No acute osseous abnormality. IMPRESSION: No active disease. Electronically Signed   By: Morgane  Naveau M.D.   On: 12/25/2023 19:53   CT Head Wo Contrast Result Date: 12/25/2023 CLINICAL DATA:  Head trauma, minor, normal mental status (Age 45-64y). Trip and fall EXAM: CT HEAD WITHOUT CONTRAST TECHNIQUE: Contiguous axial images were obtained from the base of the skull through the vertex without intravenous contrast. RADIATION DOSE REDUCTION: This exam was performed according to the departmental dose-optimization program which includes automated exposure control, adjustment of the mA and/or kV according to patient size and/or use of iterative reconstruction technique. COMPARISON:  Ct max face 12/26/14, CT head 01/14/09 FINDINGS: Brain: Cerebral ventricle sizes are concordant with the degree of cerebral volume loss. Patchy and confluent areas of decreased attenuation are noted throughout the deep and periventricular white matter of the cerebral hemispheres bilaterally, compatible with chronic microvascular ischemic disease. Bilateral basal ganglia mineralization. No evidence of large-territorial acute infarction. No parenchymal hemorrhage. No mass lesion. No extra-axial collection. No mass effect or midline shift. No hydrocephalus. Basilar cisterns are patent. Vascular: No hyperdense vessel.  Atherosclerotic calcifications are present within the cavernous internal carotid arteries. Skull: No acute fracture or focal lesion. Sinuses/Orbits: Paranasal sinuses and mastoid air cells are clear. Bilateral lens replacement. Otherwise the orbits are unremarkable. Other: None. IMPRESSION: No acute intracranial abnormality. Electronically Signed   By: Morgane  Naveau M.D.   On: 12/25/2023 19:44   DG Knee Complete 4 Views Left Result Date: 12/25/2023 CLINICAL DATA:  Trip and fall injury with left knee swelling and pain. EXAM: LEFT KNEE - COMPLETE 4+ VIEW COMPARISON:  05/29/2021 FINDINGS: Postoperative changes with left total knee arthroplasty including patellar femoral component. There is an acute transverse periprosthetic fracture of the distal left femoral metaphysis with impaction and about 1.5 cm posterior displacement of the distal fracture fragment. Moderate left knee effusion. The proximal tibia and fibula appear intact. IMPRESSION: Left total knee arthroplasty. Acute periprosthetic fracture of the distal femoral metaphysis with posterior displacement of fracture fragments. Moderate effusion. Electronically Signed   By: Elsie Gravely M.D.   On: 12/25/2023 18:32   DG Hip Unilat W or Wo Pelvis 2-3 Views Left Result Date: 12/25/2023 CLINICAL DATA:  fall EXAM: DG HIP (WITH OR WITHOUT PELVIS) 2-3V LEFT COMPARISON:  None Available. FINDINGS: Osteopenia.No evidence of pelvic fracture or diastasis.No acute hip fracture or dislocation.Mild-to-moderate joint space loss of both hips. Degenerative disc disease in the lower lumbar spine. Soft tissues are unremarkable. IMPRESSION: No acute fracture, pelvic bone diastasis, or dislocation. Electronically Signed   By: Rogelia Myers M.D.   On: 12/25/2023 18:29     Procedures   Medications Ordered in the ED  morphine  (PF) 4 MG/ML injection 4 mg (4 mg Intravenous Given 12/25/23 1724)  morphine  (PF) 4 MG/ML injection 4 mg (4 mg Intravenous Given 12/25/23 1855)                                     Medical Decision Making Amount and/or Complexity of Data Reviewed Labs: ordered. Radiology: ordered. ECG/medicine tests: ordered.  Risk Prescription drug management. Decision regarding hospitalization.   BP (!) 176/90 (BP Location: Right Arm)   Pulse 71   Temp 97.9 F (36.6 C) (Oral)   Resp 18   SpO2 97%   11:23 PM 83 year old female with history of degenerative disc disease, osteoporosis, hypertension, scoliosis, brought here via EMS from home for evaluation  of a fall.  Patient states a few hours ago she was walking out from her bathroom after fixing her hair and about to go to bingo when her foot got caught on the edge of the door and she lost balance.  States she fell striking the right side of her head against a wooden beam and fell to the ground.  She felt a pop followed by severe pain to her left knee.  She had to roll to her side and felt some pain to her left hip as well.  Her son was there and he contacted EMS and patient was brought here.  She has not received any pain medication.  She states when her knees not moving her pain is mild but does endorse intense pain with any kind of movement to her left lower extremity.  She denies any severe headache neck pain nausea confusion chest pain trouble breathing lightheadedness or dizziness.  She is not on any blood thinner medication.  She denies any precipitating symptoms prior to the fall.  No ankle or foot pain.  Patient normally walks unassisted.  She also mention having a left knee replacement last year that was done by orthopedist Dr. Rubie.  On exam, patient is laying in bed appears to be in no acute discomfort.  A makeshift splint was in place to the left lower extremity.  Patient has tenderness about the left hip mildly without any shortening the leg.  She does have moderate tenderness and swelling noted to her left knee and difficult to palpate her patella.  Decreased range of motion, no  tenderness to left ankle, DP pulse palpable.  -Labs ordered, independently viewed and interpreted by me.  Labs remarkable for reassuring labs -The patient was maintained on a cardiac monitor.  I personally viewed and interpreted the cardiac monitored which showed an underlying rhythm of: NSR -Imaging independently viewed and interpreted by me and I agree with radiologist's interpretation.  Result remarkable for L knee xray showing acute per -This patient presents to the ED for concern of fall, this involves an extensive number of treatment options, and is a complaint that carries with it a high risk of complications and morbidity.  The differential diagnosis includes fx, dislocation, strain, sprain, contusion -Co morbidities that complicate the patient evaluation includes osteoporosis -Treatment includes morphine  -Reevaluation of the patient after these medicines showed that the patient improved -PCP office notes or outside notes reviewed -Discussion with specialist on call orthopedist DR. Reyne who recommend medicine admission, NPO at midnight and anticipate surgery tomorrow. Appreciate consultation from [facility] DR. Doutova who agrees to admit pt to [facility] -Escalation to admission/observation considered: patient is agreeable with admission.       Final diagnoses:  Closed fracture of distal end of left femur, unspecified fracture morphology, initial encounter [facility])    ED Discharge Orders     None          Nivia Colon, PA-C 12/25/23 2026    Franklyn Sid SAILOR, MD 12/25/23 2246

## 2023-12-25 NOTE — Subjective & Objective (Signed)
 Pt had a mechanical fall, hit her head no LOC, not on AC Pain in left left knee, she had her Left knee replaced, hx of osteoporosis Orthopedics plans to operate in AM

## 2023-12-25 NOTE — Assessment & Plan Note (Signed)
-   management as per orthopedics,  plan to operate   in  a.m.  Keep nothing by mouth post midnight. Patient   not on anticoagulation or antiplatelet agents    Ordered type and screen, order a vitamin D  level  Patient at baseline  able to walk   100 feet   With out chest pain or shortness of breath.  She does have a Cardia murmur had an echo last year that showed mild MR   Patient denies any chest pain or shortness of breath currently and/or with exertion,   ECG showing no evidence of acute ischemia  no known history of coronary artery disease,  COPD  Liver failure   CKD  Given advanced age patient is at least moderate  risk  which has been discussed with family but at this point no furthther cardiac workup is indicated.

## 2023-12-25 NOTE — H&P (Signed)
 Christina Lyons FMW:[id number] DOB: 03/19/1941 DOA: 12/25/2023     PCP: Duanne Butler DASEN, MD   Orthopedics Dr. Garnette Raman   Oncology   Dr. Odean    Patient arrived to ER on 12/25/23 at 1606 Referred by Attending Franklyn Sid SAILOR, MD   Patient coming from:    home Lives a With family     Chief Complaint: fall    HPI: Christina Lyons is a 83 y.o. female with medical history significant of left Knee replacement last year May 2024 and right knee replacement prior to that 12 years ago, GERD, HTN, HLD, Osteoporosis, right breast cancer, heart murmur    Presented with  a fall  Pt had a mechanical fall, hit her head no LOC, not on AC Pain in left left knee, she had her Left knee replaced, hx of osteoporosis Orthopedics plans to operate in AM    Patient reports she stumbled on a threshold. No CP or SOB at baseline  She could walk about a 100 yards without sob or CP She has quit her BP  and HLD meds about 4 months ago  She has been taking her Femara  Denies significant ETOH intake   Does not smoke      Regarding pertinent Chronic problems:    Hyperlipidemia - no longer on statins   Lipid Panel     Component Value Date/Time   CHOL 147 08/09/2022 0829   TRIG 64 08/09/2022 0829   HDL 74 08/09/2022 0829   CHOLHDL 2.0 08/09/2022 0829   VLDL 13 10/28/2014 1112   LDLCALC 59 08/09/2022 0829     HTN on cozaar     last echo  Recent Results (from the past 56199 hours)  ECHOCARDIOGRAM COMPLETE   Collection Time: 12/09/22  3:44 PM  Result Value   Area-P 1/2 3.31   S' Lateral 3.00   MV M vel 6.17   MV Peak grad 152.3   Est EF 55 - 60%   Narrative      ECHOCARDIOGRAM REPORT         1. Left ventricular ejection fraction, by estimation, is 55 to 60%. The left ventricle has normal function. The left ventricle has no regional wall motion abnormalities. There is mild left ventricular hypertrophy. Left ventricular diastolic parameters  are indeterminate. The average left  ventricular global longitudinal strain is -21.7 %.  2. Right ventricular systolic function is normal. The right ventricular size is normal.  3. Left atrial size was moderately dilated.  4. Mild mitral valve regurgitation.  5. The aortic valve is tricuspid. Aortic valve regurgitation is not visualized.           obesity-   BMI Readings from Last 1 Encounters:  08/27/23 37.13 kg/m     Cancer: hx of breast cancer      While in ER:   Imaging showed left periprosthetic fracture around the distal femoral metaphysis Case was discussed with orthopedics Dr. Smith who would like patient to be transferred to [facility]    Lab Orders         Basic metabolic panel         CBC with Differential      CT HEAD   NON acute     CXR -  NON acute  Left knee -  Left total knee arthroplasty. Acute periprosthetic fracture of the distal femoral metaphysis with posterior displacement of fracture fragments. Moderate effusion.  Following Medications were ordered in ER: Medications  morphine  (  PF) 4 MG/ML injection 4 mg (4 mg Intravenous Given 12/25/23 1724)  morphine  (PF) 4 MG/ML injection 4 mg (4 mg Intravenous Given 12/25/23 1855)    _______________________________________________________ ER Provider Called:    orthopedics   Dr.Gabauer  They Recommend admit to medicine   Will see in AM       ED Triage Vitals  Encounter Vitals Group     BP 12/25/23 1615 (!) 176/90     Girls Systolic BP Percentile --      Girls Diastolic BP Percentile --      Boys Systolic BP Percentile --      Boys Diastolic BP Percentile --      Pulse Rate 12/25/23 1615 71     Resp 12/25/23 1615 18     Temp 12/25/23 1615 97.9 F (36.6 C)     Temp Source 12/25/23 1615 Oral     SpO2 12/25/23 1615 97 %     Weight --      Height --      Head Circumference --      Peak Flow --      Pain Score 12/25/23 1651 8     Pain Loc --      Pain Education --      Exclude from Growth Chart --   UFJK(75)@      _________________________________________ Significant initial  Findings: Abnormal Labs Reviewed  CBC WITH DIFFERENTIAL/PLATELET - Abnormal; Notable for the following components:      Result Value   MCV 103.9 (*)    MCH 34.1 (*)    All other components within normal limits     ECG: Ordered Personally reviewed and interpreted by me showing: HR : 109 Rhythm:Sinus tachycardia Consider right atrial enlargement Abnormal R-wave progression, early transition Borderline T abnormalities, diffuse leads QTC 457  Likely rate related   WBC     Component Value Date/Time   WBC 9.0 12/25/2023 1705   LYMPHSABS 1.0 12/25/2023 1705   MONOABS 0.6 12/25/2023 1705   EOSABS 0.1 12/25/2023 1705   BASOSABS 0.0 12/25/2023 1705    Results for orders placed or performed during the hospital encounter of 04/16/19  Culture, blood (routine x 2)     Status: None   Collection Time: 04/16/19  2:58 PM   Specimen: BLOOD  Result Value Ref Range Status   Specimen Description BLOOD LEFT ANTECUBITAL  Final   Special Requests   Final    BOTTLES DRAWN AEROBIC AND ANAEROBIC Blood Culture adequate volume   Culture   Final    NO GROWTH 5 DAYS Performed at [facility], [address] [place], [place] [address]    Report Status 04/21/2019 FINAL  Final  Culture, blood (routine x 2)     Status: None   Collection Time: 04/16/19  3:55 PM   Specimen: BLOOD RIGHT HAND  Result Value Ref Range Status   Specimen Description BLOOD RIGHT HAND  Final   Special Requests   Final    BOTTLES DRAWN AEROBIC AND ANAEROBIC Blood Culture results may not be optimal due to an inadequate volume of blood received in culture bottles   Culture   Final    NO GROWTH 5 DAYS Performed at [facility], [address], [place], [place] [address]    Report Status 04/21/2019 FINAL  Final    __________________________________________________________ Recent Labs  Lab 12/25/23 1705  NA 139  K 4.5  CO2 25  GLUCOSE 92  BUN 14  CREATININE 0.56  CALCIUM  9.3    Cr    stable,   Lab Results  Component Value Date   CREATININE 0.56 12/25/2023   CREATININE 0.59 (L) 08/09/2022   CREATININE 0.67 08/16/2020    No results for input(s): AST, ALT, ALKPHOS, BILITOT, PROT, ALBUMIN in the last 168 hours. Lab Results  Component Value Date   CALCIUM  9.3 12/25/2023    Plt: Lab Results  Component Value Date   PLT 153 12/25/2023    Recent Labs  Lab 12/25/23 1705  WBC 9.0  NEUTROABS 7.3  HGB 13.9  HCT 42.4  MCV 103.9*  PLT 153    HG/HCT   stable,      Component Value Date/Time   HGB 13.9 12/25/2023 1705   HGB 14.2 08/16/2020 1220   HCT 42.4 12/25/2023 1705   MCV 103.9 (H) 12/25/2023 1705       _______________________________________________ Hospitalist was called for admission for distal femoral fracture   The following Work up has been ordered so far:  Orders Placed This Encounter  Procedures   CT Head Wo Contrast   DG Hip Unilat W or Wo Pelvis 2-3 Views Left   DG Knee Complete 4 Views Left   Basic metabolic panel   CBC with Differential   Diet NPO time specified   Consult to orthopedic surgery   Consult to hospitalist   Saline lock IV     OTHER Significant initial  Findings:  labs showing:     DM  labs:  HbA1C: No results for input(s): HGBA1C in the last 8760 hours.     CBG (last 3)  No results for input(s): GLUCAP in the last 72 hours.        Cultures:    Component Value Date/Time   SDES BLOOD RIGHT HAND 04/16/2019 1555   SPECREQUEST  04/16/2019 1555    BOTTLES DRAWN AEROBIC AND ANAEROBIC Blood Culture results may not be optimal due to an inadequate volume of blood received in culture bottles   CULT  04/16/2019 1555    NO GROWTH 5 DAYS Performed at [facility], [address] [place], [place] [address]    REPTSTATUS 04/21/2019 FINAL 04/16/2019 1555     Radiological Exams on Admission: DG Knee Complete 4 Views Left Result Date:  12/25/2023 CLINICAL DATA:  Trip and fall injury with left knee swelling and pain. EXAM: LEFT KNEE - COMPLETE 4+ VIEW COMPARISON:  05/29/2021 FINDINGS: Postoperative changes with left total knee arthroplasty including patellar femoral component. There is an acute transverse periprosthetic fracture of the distal left femoral metaphysis with impaction and about 1.5 cm posterior displacement of the distal fracture fragment. Moderate left knee effusion. The proximal tibia and fibula appear intact. IMPRESSION: Left total knee arthroplasty. Acute periprosthetic fracture of the distal femoral metaphysis with posterior displacement of fracture fragments. Moderate effusion. Electronically Signed   By: Elsie Gravely M.D.   On: 12/25/2023 18:32   DG Hip Unilat W or Wo Pelvis 2-3 Views Left Result Date: 12/25/2023 CLINICAL DATA:  fall EXAM: DG HIP (WITH OR WITHOUT PELVIS) 2-3V LEFT COMPARISON:  None Available. FINDINGS: Osteopenia.No evidence of pelvic fracture or diastasis.No acute hip fracture or dislocation.Mild-to-moderate joint space loss of both hips. Degenerative disc disease in the lower lumbar spine. Soft tissues are unremarkable. IMPRESSION: No acute fracture, pelvic bone diastasis, or dislocation. Electronically Signed   By: Rogelia Myers M.D.   On: 12/25/2023 18:29   _______________________________________________________________________________________________________ Latest  Blood pressure (!) 169/102, pulse 98, temperature  97.9 F (36.6 C), temperature source Oral, resp. rate 16, SpO2 93%.   Vitals  labs and radiology finding personally reviewed  Review of Systems:    Pertinent positives include: left knee pain   Constitutional:  No weight loss, night sweats, Fevers, chills, fatigue, weight loss  HEENT:  No headaches, Difficulty swallowing,Tooth/dental problems,Sore throat,  No sneezing, itching, ear ache, nasal congestion, post nasal drip,  Cardio-vascular:  No chest pain, Orthopnea, PND,  anasarca, dizziness, palpitations.no Bilateral lower extremity swelling  GI:  No heartburn, indigestion, abdominal pain, nausea, vomiting, diarrhea, change in bowel habits, loss of appetite, melena, blood in stool, hematemesis Resp:  no shortness of breath at rest. No dyspnea on exertion, No excess mucus, no productive cough, No non-productive cough, No coughing up of blood.No change in color of mucus.No wheezing. Skin:  no rash or lesions. No jaundice GU:  no dysuria, change in color of urine, no urgency or frequency. No straining to urinate.  No flank pain.  Musculoskeletal:  No joint pain or no joint swelling. No decreased range of motion. No back pain.  Psych:  No change in mood or affect. No depression or anxiety. No memory loss.  Neuro: no localizing neurological complaints, no tingling, no weakness, no double vision, no gait abnormality, no slurred speech, no confusion  All systems reviewed and apart from HOPI all are negative _______________________________________________________________________________________________ Past Medical History:   Past Medical History:  Diagnosis Date   DDD (degenerative disc disease), lumbar    Family history of breast cancer 08/16/2020   GERD (gastroesophageal reflux disease)    History of bronchitis    Hyperlipidemia    Hypertension    Osteomyelitis (HCC) 1946   both legs   Osteoporosis    Other idiopathic scoliosis, thoracolumbar region       Past Surgical History:  Procedure Laterality Date   ABDOMINAL HYSTERECTOMY     BREAST LUMPECTOMY Right 09/12/2020   Procedure: RIGHT BREAST LUMPECTOMY;  Surgeon: Aron Shoulders, MD;  Location: [facility] OR;  Service: General;  Laterality: Right;   CHOLECYSTECTOMY     JOINT REPLACEMENT     right knee    Social History:  Ambulatory   cane,      reports that she quit smoking about 43 years ago. Her smoking use included cigarettes. She started smoking about 48 years ago. She has a 2.5 pack-year smoking  history. She has never used smokeless tobacco. She reports that she does not drink alcohol  and does not use drugs.    Family History:   Family History  Problem Relation Age of Onset   Asthma Mother    Asthma Sister    Heart failure Father    Breast cancer Sister        contralateral; dx 95; dx 18   Breast cancer Maternal Aunt 60   Cancer Paternal Aunt        unknown female cancer; dx before 60   Throat cancer Paternal Uncle        dx after 50   ______________________________________________________________________________________________ Allergies: Allergies  Allergen Reactions   Sulfa Antibiotics Hives   Tape Itching    Paper tape     Prior to Admission medications   Medication Sig Start Date End Date Taking? Authorizing Provider  albuterol  (VENTOLIN  HFA) 108 (90 Base) MCG/ACT inhaler SMARTSIG:1 Puff(s) Via Inhaler 4 Times Daily PRN 11/08/22   [provider]  amoxicillin -clavulanate (AUGMENTIN ) 875-125 MG tablet Take 1 tablet by mouth 2 (two) times daily. 03/12/23   Kayla,  Amber S, FNP  Cholecalciferol (VITAMIN D ) 50 MCG (2000 UT) CAPS Take 4,000 Units by mouth daily.    [provider]  gabapentin  (NEURONTIN ) 300 MG capsule Take 300 mg by mouth 2 (two) times daily. 02/26/19   [provider]  HYDROcodone -acetaminophen  (NORCO) 7.5-325 MG tablet Take 1 tablet by mouth every 6 (six) hours as needed for moderate pain (pain score 4-6). 10/17/23   Duanne Butler DASEN, MD  HYDROcodone -acetaminophen  (NORCO) 7.5-325 MG tablet Take 1 tablet by mouth every 6 (six) hours as needed for moderate pain (pain score 4-6). 11/27/23   Duanne Butler DASEN, MD  letrozole  (FEMARA ) 2.5 MG tablet TAKE 1 TABLET BY MOUTH EVERY DAY 09/23/23   Odean Potts, MD  LORazepam  (ATIVAN ) 1 MG tablet Take 1 tablet (1 mg total) by mouth 2 (two) times daily as needed for anxiety. 11/27/23   Duanne Butler DASEN, MD  losartan  (COZAAR ) 50 MG tablet Take 1 tablet (50 mg total) by mouth daily. 06/24/23    Duanne Butler DASEN, MD  montelukast  (SINGULAIR ) 10 MG tablet Take 1 tablet (10 mg total) by mouth daily. 04/30/23   Duanne Butler DASEN, MD  Multiple Vitamin (MULTIVITAMIN WITH MINERALS) TABS tablet Take 1 tablet by mouth daily with supper.    [provider]  omeprazole  (PRILOSEC) 20 MG capsule TAKE 1 CAPSULE (20 MG TOTAL) BY MOUTH 2 (TWO) TIMES DAILY BEFORE A MEAL. 06/06/23   Duanne Butler DASEN, MD  rosuvastatin  (CRESTOR ) 10 MG tablet Take 1 tablet (10 mg total) by mouth daily. 01/31/23   Duanne Butler DASEN, MD  sennosides-docusate sodium  (SENOKOT-S) 8.6-50 MG tablet Take 1 tablet by mouth daily as needed for constipation.    [provider]  traZODone  (DESYREL ) 50 MG tablet Take 1 tablet (50 mg total) by mouth at bedtime as needed for sleep. 06/10/23   Duanne Butler DASEN, MD  triamcinolone  (NASACORT ) 55 MCG/ACT AERO nasal inhaler Place 2 sprays into the nose daily. 11/08/22   Duanne Butler DASEN, MD  venlafaxine  XR (EFFEXOR -XR) 37.5 MG 24 hr capsule TAKE 1 CAPSULE BY MOUTH DAILY WITH BREAKFAST. 05/05/23   Gudena, Vinay, MD  zinc  gluconate 50 MG tablet Take 50 mg by mouth daily.    [provider]    ___________________________________________________________________________________________________ Physical Exam:    12/25/2023    7:26 PM 12/25/2023    4:15 PM 08/27/2023    4:11 PM  Vitals with BMI  Height   5' 2  Weight   203 lbs  BMI   37.12  Systolic 169 176 --  Diastolic 102 90 --  Pulse 98 71      1. General:  in No  Acute distress    Chronically ill  -appearing 2. Psychological: Alert and   Oriented 3. Head/ENT:  Dry Mucous Membranes                          Head Non traumatic, neck supple                       Poor Dentition 4. SKIN: decreased Skin turgor,  Skin clean Dry and intact no rash    5. Heart: Regular rate and rhythm  noted  Murmur, no Rub or gallop 6. Lungs:   no wheezes or crackles   7. Abdomen: Soft,  non-tender, Non distended   obese  bowel  sounds present 8. Lower extremities: no clubbing, cyanosis, no  edema 9. Neurologically Grossly intact,  moving all 4 extremities equally   10. MSK: Normal range of motion limited in left knee    Chart has been reviewed  ______________________________________________________________________________________________  Assessment/Plan 83 y.o. female with medical history significant of left Knee replacement last year May 2024 and right knee replacement prior to that 12 years ago, GERD, HTN, HLD, Osteoporosis, right breast cancer, heart murmur  Admitted for distal left femoral fracture Present on Admission:  Closed fracture of left distal femur (HCC)  Hyperlipidemia  Hypertension     Closed fracture of left distal femur (HCC)  - management as per orthopedics,  plan to operate   in  a.m.  Keep nothing by mouth post midnight. Patient   not on anticoagulation or antiplatelet agents    Ordered type and screen, order a vitamin D  level  Patient at baseline  able to walk   100 feet   With out chest pain or shortness of breath.  She does have a Cardia murmur had an echo last year that showed mild MR   Patient denies any chest pain or shortness of breath currently and/or with exertion,   ECG showing no evidence of acute ischemia  no known history of coronary artery disease,  COPD  Liver failure   CKD  Given advanced age patient is at least moderate  risk  which has been discussed with family but at this point no furthther cardiac workup is indicated.     Hyperlipidemia No longer on statin  Hypertension Has been off Cozaar   Malignant neoplasm of upper-outer quadrant of right breast in female, estrogen receptor positive (HCC) Hold femara  for tonight   Other plan as per orders.  DVT prophylaxis:  SCD      Code Status:    Code Status: Prior FULL CODE as per patient   I had personally discussed CODE STATUS with patient and family  ACP   none    Family Communication:   Family  at   Bedside  plan of care was discussed   with   Son   Diet  Diet Orders (From admission, onward)     Start     Ordered   12/25/23 1633  Diet NPO time specified  Diet effective now        12/25/23 1633            Disposition Plan:     likely will need placement for rehabilitation                          Following barriers for discharge:                                                        Pain controlled with PO medications                               Periprosthetic distal femoral fracture repair                           Will need consultants to evaluate patient prior to discharge  Consult Orders  (From admission, onward)           Start     Ordered   12/25/23 1858  Consult to hospitalist  Once       Provider:  (Not yet assigned)  Question Answer Comment  Place call to: [facility]   Reason for Consult Admit      12/25/23 1857                                Consults called: orthopedics   Admission status:  ED Disposition     ED Disposition  Admit   Condition  --   Comment  Hospital Area: [facility] [[id number]]  Level of Care: Telemetry Medical [[id number]]  May admit patient to [facility] or [facility] if equivalent level of care is available:: No  Covid Evaluation: Asymptomatic - no recent exposure (last 10 days) testing not required  Diagnosis: Closed fracture of left distal femur [facility]) [[id number]]  Admitting Physician: Zeniah Briney [[id number]]  Attending Physician: Michaele Amundson [[id number]]  Certification:: I certify this patient will need inpatient services for at least 2 midnights  Expected Medical Readiness: 12/28/2023            inpatient     I Expect 2 midnight stay secondary to severity of patient's current illness need for inpatient interventions justified by the following:  hemodynamic instability despite optimal treatment (tachycardia )     That are currently affecting medical  management.   I expect  patient to be hospitalized for 2 midnights requiring inpatient medical care.  Patient is at high risk for adverse outcome (such as loss of life or disability) if not treated.  Indication for inpatient stay as follows:  severe pain requiring acute inpatient management,  Need for operative/procedural  intervention  Need for  IV fluids,, IV pain medications     Level of care     tele  For 12H  Finley Chevez 12/25/2023, 8:39 PM    [facility]     after 2 AM please page floor coverage   If 7AM-7PM, please contact the day team taking care of the patient using [email or web address]

## 2023-12-25 NOTE — Assessment & Plan Note (Addendum)
 Hold femara  for tonight

## 2023-12-25 NOTE — ED Notes (Signed)
 Report called to [facility]. [facility] contacted for transfer.

## 2023-12-25 NOTE — Assessment & Plan Note (Signed)
 Has been off Cozaar 

## 2023-12-25 NOTE — Assessment & Plan Note (Signed)
No longer on statin  

## 2023-12-26 ENCOUNTER — Inpatient Hospital Stay (HOSPITAL_COMMUNITY): Payer: Medicare (Managed Care)

## 2023-12-26 ENCOUNTER — Encounter (HOSPITAL_COMMUNITY): Payer: Self-pay | Admitting: Internal Medicine

## 2023-12-26 ENCOUNTER — Other Ambulatory Visit: Payer: Self-pay

## 2023-12-26 ENCOUNTER — Inpatient Hospital Stay (HOSPITAL_COMMUNITY): Payer: Medicare (Managed Care) | Admitting: Certified Registered"

## 2023-12-26 ENCOUNTER — Encounter (HOSPITAL_COMMUNITY)
Admission: EM | Disposition: A | Discharge status: Skilled Nursing Facility | Payer: Self-pay | Source: Home / Self Care | Attending: Family Medicine

## 2023-12-26 DIAGNOSIS — M978XXA Periprosthetic fracture around other internal prosthetic joint, initial encounter: Secondary | ICD-10-CM | POA: Diagnosis not present

## 2023-12-26 DIAGNOSIS — I10 Essential (primary) hypertension: Secondary | ICD-10-CM | POA: Diagnosis not present

## 2023-12-26 DIAGNOSIS — S72402A Unspecified fracture of lower end of left femur, initial encounter for closed fracture: Secondary | ICD-10-CM

## 2023-12-26 DIAGNOSIS — Z87891 Personal history of nicotine dependence: Secondary | ICD-10-CM | POA: Diagnosis not present

## 2023-12-26 DIAGNOSIS — J449 Chronic obstructive pulmonary disease, unspecified: Secondary | ICD-10-CM | POA: Diagnosis not present

## 2023-12-26 HISTORY — PX: ORIF FEMUR FRACTURE: SHX2119

## 2023-12-26 LAB — CBC
HCT: 37.5 % (ref 36.0–46.0)
Hemoglobin: 12.7 g/dL (ref 12.0–15.0)
MCH: 34.6 pg — ABNORMAL HIGH (ref 26.0–34.0)
MCHC: 33.9 g/dL (ref 30.0–36.0)
MCV: 102.2 fL — ABNORMAL HIGH (ref 80.0–100.0)
Platelets: 133 K/uL — ABNORMAL LOW (ref 150–400)
RBC: 3.67 MIL/uL — ABNORMAL LOW (ref 3.87–5.11)
RDW: 12.9 % (ref 11.5–15.5)
WBC: 7.5 K/uL (ref 4.0–10.5)
nRBC: 0 % (ref 0.0–0.2)

## 2023-12-26 LAB — VITAMIN D 25 HYDROXY (VIT D DEFICIENCY, FRACTURES): Vit D, 25-Hydroxy: 28.39 ng/mL — ABNORMAL LOW (ref 30–100)

## 2023-12-26 LAB — TYPE AND SCREEN
ABO/RH(D): A NEG
Antibody Screen: NEGATIVE

## 2023-12-26 SURGERY — OPEN REDUCTION INTERNAL FIXATION (ORIF) DISTAL FEMUR FRACTURE
Anesthesia: General | Laterality: Left

## 2023-12-26 MED ORDER — HYDROMORPHONE HCL 1 MG/ML IJ SOLN
0.2500 mg | INTRAMUSCULAR | Status: DC | PRN
  Administered 2023-12-26: 0.5 mg via INTRAVENOUS
  Administered 2023-12-26: 0.25 mg via INTRAVENOUS
  Administered 2023-12-26: 0.5 mg via INTRAVENOUS
  Administered 2023-12-26: 0.25 mg via INTRAVENOUS

## 2023-12-26 MED ORDER — SUGAMMADEX SODIUM 200 MG/2ML IV SOLN
INTRAVENOUS | Status: DC | PRN
Start: 2023-12-26 — End: 2023-12-26
  Administered 2023-12-26: 180 mg via INTRAVENOUS

## 2023-12-26 MED ORDER — OXYCODONE HCL 5 MG/5ML PO SOLN: 5.0000 mg | Freq: Once | ORAL | Status: DC | PRN

## 2023-12-26 MED ORDER — HYDROMORPHONE HCL 1 MG/ML IJ SOLN
INTRAMUSCULAR | Status: AC
  Filled 2023-12-26: qty 1

## 2023-12-26 MED ORDER — ROCURONIUM BROMIDE 10 MG/ML (PF) SYRINGE
PREFILLED_SYRINGE | INTRAVENOUS | Status: AC
  Filled 2023-12-26: qty 10

## 2023-12-26 MED ORDER — METOCLOPRAMIDE HCL 5 MG PO TABS: 5.0000 mg | ORAL_TABLET | Freq: Three times a day (TID) | ORAL | Status: DC | PRN

## 2023-12-26 MED ORDER — VANCOMYCIN HCL 1000 MG IV SOLR
INTRAVENOUS | Status: DC | PRN
  Administered 2023-12-26: 1000 mg

## 2023-12-26 MED ORDER — ONDANSETRON HCL 4 MG/2ML IJ SOLN
INTRAMUSCULAR | Status: DC | PRN
  Administered 2023-12-26: 4 mg via INTRAVENOUS

## 2023-12-26 MED ORDER — CHLORHEXIDINE GLUCONATE 0.12 % MT SOLN
OROMUCOSAL | Status: AC
  Administered 2023-12-26: 15 mL
  Filled 2023-12-26: qty 15

## 2023-12-26 MED ORDER — ONDANSETRON HCL 4 MG PO TABS: 4.0000 mg | ORAL_TABLET | Freq: Four times a day (QID) | ORAL | Status: DC | PRN

## 2023-12-26 MED ORDER — ONDANSETRON HCL 4 MG/2ML IJ SOLN: 4.0000 mg | Freq: Once | INTRAMUSCULAR | Status: DC | PRN

## 2023-12-26 MED ORDER — CEFAZOLIN SODIUM-DEXTROSE 2-4 GM/100ML-% IV SOLN
2.0000 g | Freq: Three times a day (TID) | INTRAVENOUS | Status: AC
Start: 2023-12-26 — End: 2023-12-28
  Administered 2023-12-26 – 2023-12-27 (×3): 2 g via INTRAVENOUS
  Filled 2023-12-26 (×3): qty 100

## 2023-12-26 MED ORDER — CEFAZOLIN SODIUM-DEXTROSE 2-4 GM/100ML-% IV SOLN
INTRAVENOUS | Status: AC
  Filled 2023-12-26: qty 100

## 2023-12-26 MED ORDER — PHENYLEPHRINE 80 MCG/ML (10ML) SYRINGE FOR IV PUSH (FOR BLOOD PRESSURE SUPPORT)
PREFILLED_SYRINGE | INTRAVENOUS | Status: AC
  Filled 2023-12-26: qty 10

## 2023-12-26 MED ORDER — PROPOFOL 10 MG/ML IV BOLUS
INTRAVENOUS | Status: AC
  Filled 2023-12-26: qty 20

## 2023-12-26 MED ORDER — LIDOCAINE 2% (20 MG/ML) 5 ML SYRINGE
INTRAMUSCULAR | Status: DC | PRN
  Administered 2023-12-26: 80 mg via INTRAVENOUS

## 2023-12-26 MED ORDER — LACTATED RINGERS IV SOLN: INTRAVENOUS | Status: DC | PRN

## 2023-12-26 MED ORDER — ROCURONIUM BROMIDE 10 MG/ML (PF) SYRINGE
PREFILLED_SYRINGE | INTRAVENOUS | Status: DC | PRN
Start: 2023-12-26 — End: 2023-12-26
  Administered 2023-12-26: 60 mg via INTRAVENOUS

## 2023-12-26 MED ORDER — VANCOMYCIN HCL 1000 MG IV SOLR
INTRAVENOUS | Status: AC
  Filled 2023-12-26: qty 20

## 2023-12-26 MED ORDER — 0.9 % SODIUM CHLORIDE (POUR BTL) OPTIME
TOPICAL | Status: DC | PRN
  Administered 2023-12-26: 1000 mL

## 2023-12-26 MED ORDER — DEXMEDETOMIDINE HCL IN NACL 80 MCG/20ML IV SOLN
INTRAVENOUS | Status: DC | PRN
  Administered 2023-12-26: 8 ug via INTRAVENOUS
  Administered 2023-12-26: 4 ug via INTRAVENOUS

## 2023-12-26 MED ORDER — PROPOFOL 10 MG/ML IV BOLUS
INTRAVENOUS | Status: DC | PRN
  Administered 2023-12-26: 150 mg via INTRAVENOUS

## 2023-12-26 MED ORDER — ENOXAPARIN SODIUM 40 MG/0.4ML IJ SOSY
40.0000 mg | PREFILLED_SYRINGE | INTRAMUSCULAR | Status: DC
  Administered 2023-12-27 – 2023-12-31 (×5): 40 mg via SUBCUTANEOUS
  Filled 2023-12-26 (×5): qty 0.4

## 2023-12-26 MED ORDER — DEXAMETHASONE SODIUM PHOSPHATE 10 MG/ML IJ SOLN
INTRAMUSCULAR | Status: AC
  Filled 2023-12-26: qty 1

## 2023-12-26 MED ORDER — DEXAMETHASONE SODIUM PHOSPHATE 10 MG/ML IJ SOLN
INTRAMUSCULAR | Status: DC | PRN
  Administered 2023-12-26: 10 mg via INTRAVENOUS

## 2023-12-26 MED ORDER — ACETAMINOPHEN 325 MG PO TABS: 325.0000 mg | ORAL_TABLET | Freq: Four times a day (QID) | ORAL | Status: DC | PRN

## 2023-12-26 MED ORDER — METOCLOPRAMIDE HCL 5 MG/ML IJ SOLN: 5.0000 mg | Freq: Three times a day (TID) | INTRAMUSCULAR | Status: DC | PRN

## 2023-12-26 MED ORDER — OXYCODONE HCL 5 MG PO TABS: 5.0000 mg | ORAL_TABLET | Freq: Once | ORAL | Status: DC | PRN

## 2023-12-26 MED ORDER — LIDOCAINE 2% (20 MG/ML) 5 ML SYRINGE
INTRAMUSCULAR | Status: AC
  Filled 2023-12-26: qty 5

## 2023-12-26 MED ORDER — POLYETHYLENE GLYCOL 3350 17 G PO PACK
17.0000 g | PACK | Freq: Every day | ORAL | Status: DC | PRN
  Administered 2023-12-31: 17 g via ORAL
  Filled 2023-12-26: qty 1

## 2023-12-26 MED ORDER — POVIDONE-IODINE 10 % EX SWAB
2.0000 | Freq: Once | CUTANEOUS | Status: AC
  Administered 2023-12-26: 2 via TOPICAL

## 2023-12-26 MED ORDER — CHLORHEXIDINE GLUCONATE 4 % EX SOLN
60.0000 mL | Freq: Once | CUTANEOUS | Status: DC
  Filled 2023-12-26: qty 60

## 2023-12-26 MED ORDER — PHENYLEPHRINE HCL-NACL 20-0.9 MG/250ML-% IV SOLN
INTRAVENOUS | Status: DC | PRN
  Administered 2023-12-26: 55 ug/min via INTRAVENOUS

## 2023-12-26 MED ORDER — SODIUM CHLORIDE 0.9 % IV SOLN
INTRAVENOUS | Status: AC
Start: 2023-12-26 — End: 2023-12-27

## 2023-12-26 MED ORDER — CEFAZOLIN SODIUM-DEXTROSE 2-4 GM/100ML-% IV SOLN
2.0000 g | INTRAVENOUS | Status: AC
  Administered 2023-12-26: 2 g via INTRAVENOUS
  Filled 2023-12-26: qty 100

## 2023-12-26 MED ORDER — HYDROCODONE-ACETAMINOPHEN 5-325 MG PO TABS
1.0000 | ORAL_TABLET | ORAL | Status: DC | PRN
  Administered 2023-12-26 (×2): 1 via ORAL
  Administered 2023-12-27 (×3): 2 via ORAL
  Administered 2023-12-28 – 2023-12-29 (×2): 1 via ORAL
  Administered 2023-12-30 – 2023-12-31 (×2): 2 via ORAL
  Filled 2023-12-26: qty 1
  Filled 2023-12-26: qty 2
  Filled 2023-12-26: qty 1
  Filled 2023-12-26 (×3): qty 2
  Filled 2023-12-26: qty 1
  Filled 2023-12-26 (×2): qty 2

## 2023-12-26 MED ORDER — ONDANSETRON HCL 4 MG/2ML IJ SOLN
INTRAMUSCULAR | Status: AC
  Filled 2023-12-26: qty 2

## 2023-12-26 MED ORDER — HYDROCODONE-ACETAMINOPHEN 7.5-325 MG PO TABS: 1.0000 | ORAL_TABLET | ORAL | Status: DC | PRN

## 2023-12-26 MED ORDER — DOCUSATE SODIUM 100 MG PO CAPS
100.0000 mg | ORAL_CAPSULE | Freq: Two times a day (BID) | ORAL | Status: DC
  Administered 2023-12-26 – 2023-12-31 (×10): 100 mg via ORAL
  Filled 2023-12-26 (×10): qty 1

## 2023-12-26 MED ORDER — FENTANYL CITRATE (PF) 250 MCG/5ML IJ SOLN
INTRAMUSCULAR | Status: DC | PRN
  Administered 2023-12-26: 100 ug via INTRAVENOUS
  Administered 2023-12-26 (×3): 50 ug via INTRAVENOUS

## 2023-12-26 MED ORDER — ONDANSETRON HCL 4 MG/2ML IJ SOLN: 4.0000 mg | Freq: Four times a day (QID) | INTRAMUSCULAR | Status: DC | PRN

## 2023-12-26 MED ORDER — FENTANYL CITRATE (PF) 250 MCG/5ML IJ SOLN
INTRAMUSCULAR | Status: AC
  Filled 2023-12-26: qty 5

## 2023-12-26 SURGICAL SUPPLY — 58 items
BAG COUNTER SPONGE SURGICOUNT (BAG) ×1 IMPLANT
BIT DRILL 4.3X300MM (BIT) IMPLANT
BIT DRILL LONG 3.3 (BIT) IMPLANT
BIT DRILL QC 3.3X195 (BIT) IMPLANT
BLADE CLIPPER SURG (BLADE) IMPLANT
BNDG COHESIVE 6X5 TAN ST LF (GAUZE/BANDAGES/DRESSINGS) ×1 IMPLANT
BNDG ELASTIC 6X10 VLCR STRL LF (GAUZE/BANDAGES/DRESSINGS) ×1 IMPLANT
BRUSH SCRUB EZ PLAIN DRY (MISCELLANEOUS) ×2 IMPLANT
CANISTER SUCTION 3000ML PPV (SUCTIONS) ×1 IMPLANT
CAP LOCK NCB (Cap) IMPLANT
CHLORAPREP W/TINT 26 (MISCELLANEOUS) ×1 IMPLANT
COVER SURGICAL LIGHT HANDLE (MISCELLANEOUS) ×1 IMPLANT
DRAPE C-ARM 42X72 X-RAY (DRAPES) ×1 IMPLANT
DRAPE C-ARMOR (DRAPES) ×1 IMPLANT
DRAPE HALF SHEET 40X57 (DRAPES) ×2 IMPLANT
DRAPE SURG 17X23 STRL (DRAPES) ×1 IMPLANT
DRAPE SURG ORHT 6 SPLT 77X108 (DRAPES) ×2 IMPLANT
DRAPE U-SHAPE 47X51 STRL (DRAPES) ×1 IMPLANT
DRESSING MEPILEX FLEX 4X4 (GAUZE/BANDAGES/DRESSINGS) IMPLANT
DRSG ADAPTIC 3X8 NADH LF (GAUZE/BANDAGES/DRESSINGS) IMPLANT
DRSG MEPILEX POST OP 4X12 (GAUZE/BANDAGES/DRESSINGS) IMPLANT
DRSG MEPILEX POST OP 4X8 (GAUZE/BANDAGES/DRESSINGS) IMPLANT
ELECTRODE REM PT RTRN 9FT ADLT (ELECTROSURGICAL) ×1 IMPLANT
GAUZE PAD ABD 8X10 STRL (GAUZE/BANDAGES/DRESSINGS) ×3 IMPLANT
GAUZE SPONGE 4X4 12PLY STRL (GAUZE/BANDAGES/DRESSINGS) ×1 IMPLANT
GLOVE BIO SURGEON STRL SZ 6.5 (GLOVE) ×3 IMPLANT
GLOVE BIO SURGEON STRL SZ7.5 (GLOVE) ×4 IMPLANT
GLOVE BIOGEL PI IND STRL 6.5 (GLOVE) ×1 IMPLANT
GLOVE BIOGEL PI IND STRL 7.5 (GLOVE) ×1 IMPLANT
GOWN STRL REUS W/ TWL LRG LVL3 (GOWN DISPOSABLE) ×3 IMPLANT
KIT BASIN OR (CUSTOM PROCEDURE TRAY) ×1 IMPLANT
KIT TURNOVER KIT B (KITS) ×1 IMPLANT
KWIRE FXSTD 280X2XNS SS (WIRE) IMPLANT
PACK TOTAL JOINT (CUSTOM PROCEDURE TRAY) ×1 IMPLANT
PAD ARMBOARD POSITIONER FOAM (MISCELLANEOUS) ×1 IMPLANT
PAD CAST 4YDX4 CTTN HI CHSV (CAST SUPPLIES) ×1 IMPLANT
PADDING CAST COTTON 6X4 STRL (CAST SUPPLIES) ×1 IMPLANT
PLATE DIST FEM 12H (Plate) IMPLANT
SCREW 5.0 70MM (Screw) IMPLANT
SCREW 5.0 80MM (Screw) IMPLANT
SCREW CORTICAL NCB 5.0X40 (Screw) IMPLANT
SCREW NCB 3.5X75X5X6.2XST (Screw) IMPLANT
SCREW NCB 4.0X40MM (Screw) IMPLANT
SCREW NCB 5.0X38 (Screw) IMPLANT
SCREW NCB 5.0X85MM (Screw) IMPLANT
SOLN 0.9% NACL 1000 ML (IV SOLUTION) ×1 IMPLANT
SOLN 0.9% NACL POUR BTL 1000ML (IV SOLUTION) ×1 IMPLANT
SOLN STERILE WATER 1000 ML (IV SOLUTION) ×2 IMPLANT
SOLN STERILE WATER BTL 1000 ML (IV SOLUTION) ×2 IMPLANT
SPONGE T-LAP 18X18 ~~LOC~~+RFID (SPONGE) IMPLANT
STAPLER SKIN PROX 35W (STAPLE) ×1 IMPLANT
SUCTION TUBE FRAZIER 10FR DISP (SUCTIONS) ×1 IMPLANT
SUT ETHILON 3 0 PS 1 (SUTURE) ×2 IMPLANT
SUT VIC AB 0 CT1 27XBRD ANBCTR (SUTURE) IMPLANT
SUT VIC AB 1 CT1 27XBRD ANBCTR (SUTURE) IMPLANT
SUT VIC AB 2-0 CT1 TAPERPNT 27 (SUTURE) ×2 IMPLANT
TOWEL GREEN STERILE (TOWEL DISPOSABLE) ×2 IMPLANT
TRAY FOLEY MTR SLVR 16FR STAT (SET/KITS/TRAYS/PACK) IMPLANT

## 2023-12-26 NOTE — Op Note (Signed)
 Orthopaedic Surgery Operative Note (CSN: [id number] ) Date of Surgery: 12/26/2023  Admit Date: 12/25/2023   Diagnoses: Pre-Op Diagnoses: Left periprosthetic supracondylar distal femur fracture  Post-Op Diagnosis: Same  Procedures: CPT 27511-Open reduction internal fixation of left supracondylar distal femur fracture  Surgeons : Primary: Kendal Franky SQUIBB, MD  Assistant: Lauraine Moores, PA-C  Location: OR 3   Anesthesia: General   Antibiotics: Ancef  2g preop with 1 gm vancomycin  powder placed topically   Tourniquet time: None    Estimated Blood Loss: 100 mL  Complications:* No complications entered in OR log *   Specimens:* No specimens in log *   Implants: Implant Name Type Inv. Item Serial No. Manufacturer Lot No. LRB No. Used Action  CAP LOCK NCB - [id number] Cap CAP LOCK NCB  [employer](ORTH,TRAU,BIO,SG)  Left 7 Implanted  PLATE DIST FEM 87Y - [id number] Plate PLATE DIST FEM 87Y  [employer](ORTH,TRAU,BIO,SG)  Left 1 Implanted  SCREW NCB 6.4K24K4K3.2XST - [id number] Screw SCREW NCB [id number]  [employer](ORTH,TRAU,BIO,SG)  Left 1 Implanted  SCREW 5.0 - [id number] Screw SCREW 5.0  [employer](ORTH,TRAU,BIO,SG)  Left 2 Implanted  SCREW CORTICAL NCB 5.0X40 - [id number] Screw SCREW CORTICAL NCB 5.0X40  [employer](ORTH,TRAU,BIO,SG)  Left 1 Implanted  SCREW NCB 5.0X85MM - [id number] Screw SCREW NCB 5.0X85MM  [employer](ORTH,TRAU,BIO,SG)  Left 1 Implanted  SCREW 5.0 - [id number] Screw SCREW 5.0  [employer](ORTH,TRAU,BIO,SG)  Left 1 Implanted  SCREW NCB 5.0X38 - [id number] Screw SCREW NCB 5.0X38  [employer](ORTH,TRAU,BIO,SG)  Left 1 Implanted  SCREW NCB 4.0X40MM - [id number] Screw SCREW NCB 4.0X40MM  [employer](ORTH,TRAU,BIO,SG)  Left 1 Implanted     Indications for Surgery: 83 year old female who sustained a ground-level fall with a left periprosthetic supracondylar distal femur fracture.  Due to the unstable nature of her injury I  recommend proceeding with open reduction internal fixation.  Risks and benefits were discussed with the patient.  Risks include but not limited to bleeding, infection, malunion, nonunion, hardware irritation, nerve and blood vessel injury, knee stiffness, DVT, even the possibility anesthetic complications.  She agreed to proceed with surgery and consent was obtained.  Operative Findings: 1.  Open reduction internal fixation of left periprosthetic supracondylar distal femur fracture using [employer] NCB distal femoral locking plate.  Procedure: The patient was identified in the preoperative holding area. Consent was confirmed with the patient and their family and all questions were answered. The operative extremity was marked after confirmation with the patient. she was then brought back to the operating room by our anesthesia colleagues.  She was placed under general anesthetic and carefully transferred over to a radiolucent flattop table.  A bump was placed under his operative hip.  The left lower extremity was then prepped and draped in usual sterile fashion.  A timeout was performed to verify the patient, the procedure, and the extremity.  Preoperative antibiotics were dosed.  The hip and knee were flexed over a triangle fluoroscopic imaging showed the unstable nature of her injury.  A lateral approach to the distal femur was made and carried down through skin and subcutaneous tissue.  I incised through the IT band and expose the lateral condyle of the femur.  The fracture aligned appropriately with gentle traction and manipulation of the triangle.  I then held the distal portion of a 12 hole [employer] plate distally with a 2.0 mm guidewire.  Attached to the targeting arm I then directed a 3.3 mm drill bit in the proximal hole  to align the proximal portion of the plate.  I then drilled and placed nonlocking screws distally to bring the plate flush to bone.  I then percutaneously placed 5.0  millimeter screws in the femoral shaft to correct the coronal alignment.  I removed the 3.3 mm drill bit and placed a 4.0 millimeter screw proximally.  I placed locking caps on the 5.0 millimeter screws and removed the targeting arm.  I then proceeded to place a total of 5 screws distally.  Locking caps were placed and all of the distal screws.  Final fluoroscopic imaging was obtained.  The incisions were copiously irrigated.  A gram vancomycin  powder was placed into the incision.  A layered closure of 0 Vicryl, 2-0 Monocryl and 3-0 Monocryl with Dermabond was used to close the skin.  Sterile dressings were applied.  The patient was then awoke from anesthesia and taken to the PACU in stable condition.  Post Op Plan/Instructions: The patient be weightbearing as tolerated to the left lower extremity.  She will receive postoperative antibiotics.  She will receive Lovenox  for DVT prophylaxis and discharged on oral DOAC.  We will have her mobilize with physical and Occupational Therapy.  I was present and performed the entire surgery.  Lauraine Moores, PA-C did assist me throughout the case. An assistant was necessary given the difficulty in approach, maintenance of reduction and ability to instrument the fracture.   Franky Light, MD [facility]

## 2023-12-26 NOTE — Transfer of Care (Signed)
 Immediate Anesthesia Transfer of Care Note  Patient: Christina Lyons  Procedure(s) Performed: OPEN REDUCTION INTERNAL FIXATION (ORIF) DISTAL FEMUR FRACTURE, LEFT (Left)  Patient Location: PACU  Anesthesia Type:General  Level of Consciousness: awake, drowsy, patient cooperative, and responds to stimulation  Airway & Oxygen  Therapy: Patient Spontanous Breathing and Patient connected to face mask oxygen   Post-op Assessment: Report given to RN and Post -op Vital signs reviewed and stable  Post vital signs: Reviewed and stable  Last Vitals:  Vitals Value Taken Time  BP 171/123 12/26/23 13:49  Temp    Pulse 94 12/26/23 13:51  Resp 12 12/26/23 13:51  SpO2 98 % 12/26/23 13:51  Vitals shown include unfiled device data.  Last Pain:  Vitals:   12/26/23 1052  TempSrc: Oral  PainSc: 0-No pain         Complications: No notable events documented.

## 2023-12-26 NOTE — Progress Notes (Signed)
 Orthopedic Tech Progress Note Patient Details:  Christina Lyons [birth year] [id number]  Patient ID: Christina Lyons, female   DOB: [birth year], 83 y.o.   MRN: [id number] No OHF. Age restricted Christina Lyons 12/26/2023, 3:53 PM

## 2023-12-26 NOTE — Anesthesia Procedure Notes (Signed)
 Procedure Name: Intubation Date/Time: 12/26/2023 12:40 PM  Performed by: Myrna Homer, CRNAPre-anesthesia Checklist: Patient identified, Emergency Drugs available, Suction available and Patient being monitored Patient Re-evaluated:Patient Re-evaluated prior to induction Oxygen  Delivery Method: Circle System Utilized Preoxygenation: Pre-oxygenation with 100% oxygen  Induction Type: IV induction Ventilation: Mask ventilation without difficulty Laryngoscope Size: Glidescope and 3 Grade View: Grade I Tube type: Oral Tube size: 7.0 mm Number of attempts: 1 Airway Equipment and Method: Stylet and Oral airway Placement Confirmation: ETT inserted through vocal cords under direct vision, positive ETCO2 and breath sounds checked- equal and bilateral Secured at: 21 cm Tube secured with: Tape Dental Injury: Teeth and Oropharynx as per pre-operative assessment

## 2023-12-26 NOTE — Anesthesia Postprocedure Evaluation (Signed)
 Anesthesia Post Note  Patient: Christina Lyons  Procedure(s) Performed: OPEN REDUCTION INTERNAL FIXATION (ORIF) DISTAL FEMUR FRACTURE, LEFT (Left)     Patient location during evaluation: PACU Anesthesia Type: General Level of consciousness: awake and alert and oriented Pain management: pain level controlled Vital Signs Assessment: post-procedure vital signs reviewed and stable Respiratory status: spontaneous breathing, nonlabored ventilation and respiratory function stable Cardiovascular status: blood pressure returned to baseline and stable Postop Assessment: no apparent nausea or vomiting Anesthetic complications: no   No notable events documented.  Last Vitals:  Vitals:   12/26/23 1415 12/26/23 1430  BP: (!) 162/88 (!) 153/87  Pulse: 92 93  Resp: 13 15  Temp:    SpO2: 94% 93%    Last Pain:  Vitals:   12/26/23 1437  TempSrc:   PainSc: 6                  Braylyn Eye A.

## 2023-12-26 NOTE — H&P (View-Only) (Signed)
 Reason for Consult:Left distal femur fx Referring Physician: Darcel Dawley Time called: 0730 Time at bedside: 0858   Christina Lyons is an 83 y.o. female.  HPI: Marvin tripped and fell at home yesterday. Christina Lyons had immediate left knee pain and could not get up. Christina Lyons was brought to the ED at [facility] where x-rays showed a distal femur fx and orthopedic surgery was consulted. Christina Lyons was transferred to [facility] for definitive care by orthopedic trauma. Christina Lyons lives at home and does not use any assistive devices to ambulate.  Past Medical History:  Diagnosis Date   DDD (degenerative disc disease), lumbar    Family history of breast cancer 08/16/2020   GERD (gastroesophageal reflux disease)    History of bronchitis    Hyperlipidemia    Hypertension    Osteomyelitis (HCC) 1946   both legs   Osteoporosis    Other idiopathic scoliosis, thoracolumbar region     Past Surgical History:  Procedure Laterality Date   ABDOMINAL HYSTERECTOMY     BREAST LUMPECTOMY Right 09/12/2020   Procedure: RIGHT BREAST LUMPECTOMY;  Surgeon: Aron Shoulders, MD;  Location: [facility] OR;  Service: General;  Laterality: Right;   CHOLECYSTECTOMY     JOINT REPLACEMENT     right knee    Family History  Problem Relation Age of Onset   Asthma Mother    Asthma Sister    Heart failure Father    Breast cancer Sister        contralateral; dx 57; dx 63   Breast cancer Maternal Aunt 83   Cancer Paternal Aunt        unknown female cancer; dx before 60   Throat cancer Paternal Uncle        dx after 82    Social History:  reports that Christina Lyons quit smoking about 43 years ago. Her smoking use included cigarettes. Christina Lyons started smoking about 48 years ago. Christina Lyons has a 2.5 pack-year smoking history. Christina Lyons has never used smokeless tobacco. Christina Lyons reports that Christina Lyons does not drink alcohol  and does not use drugs.  Allergies:  Allergies  Allergen Reactions   Sulfa Antibiotics Hives   Tape Itching    Paper tape    Medications: I have reviewed the patient's  current medications.  Results for orders placed or performed during the hospital encounter of 12/25/23 (from the past 48 hours)  Basic metabolic panel     Status: None   Collection Time: 12/25/23  5:05 PM  Result Value Ref Range   Sodium 139 135 - 145 mmol/L   Potassium 4.5 3.5 - 5.1 mmol/L    Comment: HEMOLYSIS AT THIS LEVEL MAY AFFECT RESULT   Chloride 102 98 - 111 mmol/L   CO2 25 22 - 32 mmol/L   Glucose, Bld 92 70 - 99 mg/dL    Comment: Glucose reference range applies only to samples taken after fasting for at least 8 hours.   BUN 14 8 - 23 mg/dL   Creatinine, Ser 9.43 0.44 - 1.00 mg/dL   Calcium  9.3 8.9 - 10.3 mg/dL   GFR, Estimated >39 >39 mL/min    Comment: (NOTE) Calculated using the CKD-EPI Creatinine Equation (2021)    Anion gap 12 5 - 15    Comment: Performed at [facility], [address], [place], [place] [address]  CBC with Differential     Status: Abnormal   Collection Time: 12/25/23  5:05 PM  Result Value Ref Range   WBC 9.0 4.0 - 10.5 K/uL  RBC 4.08 3.87 - 5.11 MIL/uL   Hemoglobin 13.9 12.0 - 15.0 g/dL   HCT 57.5 63.9 - 53.9 %   MCV 103.9 (H) 80.0 - 100.0 fL   MCH 34.1 (H) 26.0 - 34.0 pg   MCHC 32.8 30.0 - 36.0 g/dL   RDW 87.0 88.4 - 84.4 %   Platelets 153 150 - 400 K/uL   nRBC 0.0 0.0 - 0.2 %   Neutrophils Relative % 81 %   Neutro Abs 7.3 1.7 - 7.7 K/uL   Lymphocytes Relative 11 %   Lymphs Abs 1.0 0.7 - 4.0 K/uL   Monocytes Relative 7 %   Monocytes Absolute 0.6 0.1 - 1.0 K/uL   Eosinophils Relative 1 %   Eosinophils Absolute 0.1 0.0 - 0.5 K/uL   Basophils Relative 0 %   Basophils Absolute 0.0 0.0 - 0.1 K/uL   Immature Granulocytes 0 %   Abs Immature Granulocytes 0.03 0.00 - 0.07 K/uL    Comment: Performed at [facility], [address], [place], [place] [address]  Type and screen     Status: None   Collection Time: 12/26/23 12:43 AM  Result Value Ref Range   ABO/RH(D) A NEG    Antibody Screen NEG    Sample  Expiration      12/29/2023,2359 Performed at [facility], [address] [place], [place] [address]   CBC     Status: Abnormal   Collection Time: 12/26/23  3:29 AM  Result Value Ref Range   WBC 7.5 4.0 - 10.5 K/uL   RBC 3.67 (L) 3.87 - 5.11 MIL/uL   Hemoglobin 12.7 12.0 - 15.0 g/dL   HCT 62.4 63.9 - 53.9 %   MCV 102.2 (H) 80.0 - 100.0 fL   MCH 34.6 (H) 26.0 - 34.0 pg   MCHC 33.9 30.0 - 36.0 g/dL   RDW 87.0 88.4 - 84.4 %   Platelets 133 (L) 150 - 400 K/uL   nRBC 0.0 0.0 - 0.2 %    Comment: Performed at [facility], [address], [place], [place] [address]  VITAMIN D  25 Hydroxy (Vit-D Deficiency, Fractures)     Status: Abnormal   Collection Time: 12/26/23  3:29 AM  Result Value Ref Range   Vit D, 25-Hydroxy 28.39 (L) 30 - 100 ng/mL    Comment: (NOTE) Vitamin D  deficiency has been defined by the Institute of Medicine  and an Endocrine Society practice guideline as a level of serum 25-OH  vitamin D  less than 20 ng/mL (1,2). The Endocrine Society went on to  further define vitamin D  insufficiency as a level between 21 and 29  ng/mL (2).  1. IOM (Institute of Medicine). 2010. Dietary reference intakes for  calcium  and D. [place]: [employer]. 2. Holick MF, Binkley Walton, Bischoff-Ferrari HA, et al. Evaluation,  treatment, and prevention of vitamin D  deficiency: an Endocrine  Society clinical practice guideline, JCEM. 2011 Jul; 96(7): 1911-30.  Performed at [facility], [address], [place], [place] [address]     DG Chest Portable 1 View Result Date: 12/25/2023 CLINICAL DATA:  preop EXAM: PORTABLE CHEST 1 VIEW COMPARISON:  Chest x-ray 04/16/2019 FINDINGS: The heart and mediastinal contours are within normal limits. Elevated left hemidiaphragm. No focal consolidation. No pulmonary edema. No pleural effusion. No pneumothorax. No acute osseous abnormality. IMPRESSION: No active disease. Electronically Signed   By: Morgane  Naveau M.D.    On: 12/25/2023 19:53   CT  Head Wo Contrast Result Date: 12/25/2023 CLINICAL DATA:  Head trauma, minor, normal mental status (Age 75-64y). Trip and fall EXAM: CT HEAD WITHOUT CONTRAST TECHNIQUE: Contiguous axial images were obtained from the base of the skull through the vertex without intravenous contrast. RADIATION DOSE REDUCTION: This exam was performed according to the departmental dose-optimization program which includes automated exposure control, adjustment of the mA and/or kV according to patient size and/or use of iterative reconstruction technique. COMPARISON:  Ct max face 12/26/14, CT head 01/14/09 FINDINGS: Brain: Cerebral ventricle sizes are concordant with the degree of cerebral volume loss. Patchy and confluent areas of decreased attenuation are noted throughout the deep and periventricular white matter of the cerebral hemispheres bilaterally, compatible with chronic microvascular ischemic disease. Bilateral basal ganglia mineralization. No evidence of large-territorial acute infarction. No parenchymal hemorrhage. No mass lesion. No extra-axial collection. No mass effect or midline shift. No hydrocephalus. Basilar cisterns are patent. Vascular: No hyperdense vessel. Atherosclerotic calcifications are present within the cavernous internal carotid arteries. Skull: No acute fracture or focal lesion. Sinuses/Orbits: Paranasal sinuses and mastoid air cells are clear. Bilateral lens replacement. Otherwise the orbits are unremarkable. Other: None. IMPRESSION: No acute intracranial abnormality. Electronically Signed   By: Morgane  Naveau M.D.   On: 12/25/2023 19:44   DG Knee Complete 4 Views Left Result Date: 12/25/2023 CLINICAL DATA:  Trip and fall injury with left knee swelling and pain. EXAM: LEFT KNEE - COMPLETE 4+ VIEW COMPARISON:  05/29/2021 FINDINGS: Postoperative changes with left total knee arthroplasty including patellar femoral component. There is an acute transverse periprosthetic fracture of  the distal left femoral metaphysis with impaction and about 1.5 cm posterior displacement of the distal fracture fragment. Moderate left knee effusion. The proximal tibia and fibula appear intact. IMPRESSION: Left total knee arthroplasty. Acute periprosthetic fracture of the distal femoral metaphysis with posterior displacement of fracture fragments. Moderate effusion. Electronically Signed   By: Elsie Gravely M.D.   On: 12/25/2023 18:32   DG Hip Unilat W or Wo Pelvis 2-3 Views Left Result Date: 12/25/2023 CLINICAL DATA:  fall EXAM: DG HIP (WITH OR WITHOUT PELVIS) 2-3V LEFT COMPARISON:  None Available. FINDINGS: Osteopenia.No evidence of pelvic fracture or diastasis.No acute hip fracture or dislocation.Mild-to-moderate joint space loss of both hips. Degenerative disc disease in the lower lumbar spine. Soft tissues are unremarkable. IMPRESSION: No acute fracture, pelvic bone diastasis, or dislocation. Electronically Signed   By: Rogelia Myers M.D.   On: 12/25/2023 18:29    Review of Systems  HENT:  Negative for ear discharge, ear pain, hearing loss and tinnitus.   Eyes:  Negative for photophobia and pain.  Respiratory:  Negative for cough and shortness of breath.   Cardiovascular:  Negative for chest pain.  Gastrointestinal:  Negative for abdominal pain, nausea and vomiting.  Genitourinary:  Negative for dysuria, flank pain, frequency and urgency.  Musculoskeletal:  Positive for arthralgias (Left knee). Negative for back pain, myalgias and neck pain.  Neurological:  Negative for dizziness and headaches.  Hematological:  Does not bruise/bleed easily.  Psychiatric/Behavioral:  The patient is not nervous/anxious.    Blood pressure 131/76, pulse 87, temperature 98.3 F (36.8 C), temperature source Oral, resp. rate 19, SpO2 90%. Physical Exam Constitutional:      General: Christina Lyons is not in acute distress.    Appearance: Christina Lyons is well-developed. Christina Lyons is not diaphoretic.  HENT:     Head: Normocephalic  and atraumatic.  Eyes:     General: No scleral icterus.  Right eye: No discharge.        Left eye: No discharge.     Conjunctiva/sclera: Conjunctivae normal.  Cardiovascular:     Rate and Rhythm: Normal rate and regular rhythm.  Pulmonary:     Effort: Pulmonary effort is normal. No respiratory distress.  Musculoskeletal:     Cervical back: Normal range of motion.     Comments: LLE No traumatic wounds, ecchymosis, or rash  Mod TTP knee  No ankle effusion  Sens DPN, SPN, TN intact  Motor EHL, ext, flex, evers 5/5  DP 2+, PT 2+, No significant edema  Skin:    General: Skin is warm and dry.  Neurological:     Mental Status: Christina Lyons is alert.  Psychiatric:        Mood and Affect: Mood normal.        Behavior: Behavior normal.     Assessment/Plan: Left distal femur fx -- Plan ORIF today with Dr. Kendal. Please keep NPO. Multiple medical problems including degenerative disc disease, osteoporosis, hypertension, and scoliosis -- per primary service    Ozell DOROTHA Ned, PA-C Orthopedic Surgery [phone] 12/26/2023, 9:06 AM

## 2023-12-26 NOTE — Consult Note (Signed)
 Reason for Consult:Left distal femur fx Referring Physician: Darcel Dawley Time called: 0730 Time at bedside: 0858   Christina Lyons is an 83 y.o. female.  HPI: Marvin tripped and fell at home yesterday. She had immediate left knee pain and could not get up. She was brought to the ED at [facility] where x-rays showed a distal femur fx and orthopedic surgery was consulted. She was transferred to [facility] for definitive care by orthopedic trauma. She lives at home and does not use any assistive devices to ambulate.  Past Medical History:  Diagnosis Date   DDD (degenerative disc disease), lumbar    Family history of breast cancer 08/16/2020   GERD (gastroesophageal reflux disease)    History of bronchitis    Hyperlipidemia    Hypertension    Osteomyelitis (HCC) 1946   both legs   Osteoporosis    Other idiopathic scoliosis, thoracolumbar region     Past Surgical History:  Procedure Laterality Date   ABDOMINAL HYSTERECTOMY     BREAST LUMPECTOMY Right 09/12/2020   Procedure: RIGHT BREAST LUMPECTOMY;  Surgeon: Aron Shoulders, MD;  Location: [facility] OR;  Service: General;  Laterality: Right;   CHOLECYSTECTOMY     JOINT REPLACEMENT     right knee    Family History  Problem Relation Age of Onset   Asthma Mother    Asthma Sister    Heart failure Father    Breast cancer Sister        contralateral; dx 57; dx 63   Breast cancer Maternal Aunt 83   Cancer Paternal Aunt        unknown female cancer; dx before 60   Throat cancer Paternal Uncle        dx after 82    Social History:  reports that she quit smoking about 43 years ago. Her smoking use included cigarettes. She started smoking about 48 years ago. She has a 2.5 pack-year smoking history. She has never used smokeless tobacco. She reports that she does not drink alcohol  and does not use drugs.  Allergies:  Allergies  Allergen Reactions   Sulfa Antibiotics Hives   Tape Itching    Paper tape    Medications: I have reviewed the patient's  current medications.  Results for orders placed or performed during the hospital encounter of 12/25/23 (from the past 48 hours)  Basic metabolic panel     Status: None   Collection Time: 12/25/23  5:05 PM  Result Value Ref Range   Sodium 139 135 - 145 mmol/L   Potassium 4.5 3.5 - 5.1 mmol/L    Comment: HEMOLYSIS AT THIS LEVEL MAY AFFECT RESULT   Chloride 102 98 - 111 mmol/L   CO2 25 22 - 32 mmol/L   Glucose, Bld 92 70 - 99 mg/dL    Comment: Glucose reference range applies only to samples taken after fasting for at least 8 hours.   BUN 14 8 - 23 mg/dL   Creatinine, Ser 9.43 0.44 - 1.00 mg/dL   Calcium  9.3 8.9 - 10.3 mg/dL   GFR, Estimated >39 >39 mL/min    Comment: (NOTE) Calculated using the CKD-EPI Creatinine Equation (2021)    Anion gap 12 5 - 15    Comment: Performed at [facility], [address], [place], [place] [address]  CBC with Differential     Status: Abnormal   Collection Time: 12/25/23  5:05 PM  Result Value Ref Range   WBC 9.0 4.0 - 10.5 K/uL  RBC 4.08 3.87 - 5.11 MIL/uL   Hemoglobin 13.9 12.0 - 15.0 g/dL   HCT 57.5 63.9 - 53.9 %   MCV 103.9 (H) 80.0 - 100.0 fL   MCH 34.1 (H) 26.0 - 34.0 pg   MCHC 32.8 30.0 - 36.0 g/dL   RDW 87.0 88.4 - 84.4 %   Platelets 153 150 - 400 K/uL   nRBC 0.0 0.0 - 0.2 %   Neutrophils Relative % 81 %   Neutro Abs 7.3 1.7 - 7.7 K/uL   Lymphocytes Relative 11 %   Lymphs Abs 1.0 0.7 - 4.0 K/uL   Monocytes Relative 7 %   Monocytes Absolute 0.6 0.1 - 1.0 K/uL   Eosinophils Relative 1 %   Eosinophils Absolute 0.1 0.0 - 0.5 K/uL   Basophils Relative 0 %   Basophils Absolute 0.0 0.0 - 0.1 K/uL   Immature Granulocytes 0 %   Abs Immature Granulocytes 0.03 0.00 - 0.07 K/uL    Comment: Performed at [facility], [address], [place], [place] [address]  Type and screen     Status: None   Collection Time: 12/26/23 12:43 AM  Result Value Ref Range   ABO/RH(D) A NEG    Antibody Screen NEG    Sample  Expiration      12/29/2023,2359 Performed at [facility], [address] [place], [place] [address]   CBC     Status: Abnormal   Collection Time: 12/26/23  3:29 AM  Result Value Ref Range   WBC 7.5 4.0 - 10.5 K/uL   RBC 3.67 (L) 3.87 - 5.11 MIL/uL   Hemoglobin 12.7 12.0 - 15.0 g/dL   HCT 62.4 63.9 - 53.9 %   MCV 102.2 (H) 80.0 - 100.0 fL   MCH 34.6 (H) 26.0 - 34.0 pg   MCHC 33.9 30.0 - 36.0 g/dL   RDW 87.0 88.4 - 84.4 %   Platelets 133 (L) 150 - 400 K/uL   nRBC 0.0 0.0 - 0.2 %    Comment: Performed at [facility], [address], [place], [place] [address]  VITAMIN D  25 Hydroxy (Vit-D Deficiency, Fractures)     Status: Abnormal   Collection Time: 12/26/23  3:29 AM  Result Value Ref Range   Vit D, 25-Hydroxy 28.39 (L) 30 - 100 ng/mL    Comment: (NOTE) Vitamin D  deficiency has been defined by the Institute of Medicine  and an Endocrine Society practice guideline as a level of serum 25-OH  vitamin D  less than 20 ng/mL (1,2). The Endocrine Society went on to  further define vitamin D  insufficiency as a level between 21 and 29  ng/mL (2).  1. IOM (Institute of Medicine). 2010. Dietary reference intakes for  calcium  and D. [place]: [employer]. 2. Holick MF, Binkley Walton, Bischoff-Ferrari HA, et al. Evaluation,  treatment, and prevention of vitamin D  deficiency: an Endocrine  Society clinical practice guideline, JCEM. 2011 Jul; 96(7): 1911-30.  Performed at [facility], [address], [place], [place] [address]     DG Chest Portable 1 View Result Date: 12/25/2023 CLINICAL DATA:  preop EXAM: PORTABLE CHEST 1 VIEW COMPARISON:  Chest x-ray 04/16/2019 FINDINGS: The heart and mediastinal contours are within normal limits. Elevated left hemidiaphragm. No focal consolidation. No pulmonary edema. No pleural effusion. No pneumothorax. No acute osseous abnormality. IMPRESSION: No active disease. Electronically Signed   By: Morgane  Naveau M.D.    On: 12/25/2023 19:53   CT  Head Wo Contrast Result Date: 12/25/2023 CLINICAL DATA:  Head trauma, minor, normal mental status (Age 75-64y). Trip and fall EXAM: CT HEAD WITHOUT CONTRAST TECHNIQUE: Contiguous axial images were obtained from the base of the skull through the vertex without intravenous contrast. RADIATION DOSE REDUCTION: This exam was performed according to the departmental dose-optimization program which includes automated exposure control, adjustment of the mA and/or kV according to patient size and/or use of iterative reconstruction technique. COMPARISON:  Ct max face 12/26/14, CT head 01/14/09 FINDINGS: Brain: Cerebral ventricle sizes are concordant with the degree of cerebral volume loss. Patchy and confluent areas of decreased attenuation are noted throughout the deep and periventricular white matter of the cerebral hemispheres bilaterally, compatible with chronic microvascular ischemic disease. Bilateral basal ganglia mineralization. No evidence of large-territorial acute infarction. No parenchymal hemorrhage. No mass lesion. No extra-axial collection. No mass effect or midline shift. No hydrocephalus. Basilar cisterns are patent. Vascular: No hyperdense vessel. Atherosclerotic calcifications are present within the cavernous internal carotid arteries. Skull: No acute fracture or focal lesion. Sinuses/Orbits: Paranasal sinuses and mastoid air cells are clear. Bilateral lens replacement. Otherwise the orbits are unremarkable. Other: None. IMPRESSION: No acute intracranial abnormality. Electronically Signed   By: Morgane  Naveau M.D.   On: 12/25/2023 19:44   DG Knee Complete 4 Views Left Result Date: 12/25/2023 CLINICAL DATA:  Trip and fall injury with left knee swelling and pain. EXAM: LEFT KNEE - COMPLETE 4+ VIEW COMPARISON:  05/29/2021 FINDINGS: Postoperative changes with left total knee arthroplasty including patellar femoral component. There is an acute transverse periprosthetic fracture of  the distal left femoral metaphysis with impaction and about 1.5 cm posterior displacement of the distal fracture fragment. Moderate left knee effusion. The proximal tibia and fibula appear intact. IMPRESSION: Left total knee arthroplasty. Acute periprosthetic fracture of the distal femoral metaphysis with posterior displacement of fracture fragments. Moderate effusion. Electronically Signed   By: Elsie Gravely M.D.   On: 12/25/2023 18:32   DG Hip Unilat W or Wo Pelvis 2-3 Views Left Result Date: 12/25/2023 CLINICAL DATA:  fall EXAM: DG HIP (WITH OR WITHOUT PELVIS) 2-3V LEFT COMPARISON:  None Available. FINDINGS: Osteopenia.No evidence of pelvic fracture or diastasis.No acute hip fracture or dislocation.Mild-to-moderate joint space loss of both hips. Degenerative disc disease in the lower lumbar spine. Soft tissues are unremarkable. IMPRESSION: No acute fracture, pelvic bone diastasis, or dislocation. Electronically Signed   By: Rogelia Myers M.D.   On: 12/25/2023 18:29    Review of Systems  HENT:  Negative for ear discharge, ear pain, hearing loss and tinnitus.   Eyes:  Negative for photophobia and pain.  Respiratory:  Negative for cough and shortness of breath.   Cardiovascular:  Negative for chest pain.  Gastrointestinal:  Negative for abdominal pain, nausea and vomiting.  Genitourinary:  Negative for dysuria, flank pain, frequency and urgency.  Musculoskeletal:  Positive for arthralgias (Left knee). Negative for back pain, myalgias and neck pain.  Neurological:  Negative for dizziness and headaches.  Hematological:  Does not bruise/bleed easily.  Psychiatric/Behavioral:  The patient is not nervous/anxious.    Blood pressure 131/76, pulse 87, temperature 98.3 F (36.8 C), temperature source Oral, resp. rate 19, SpO2 90%. Physical Exam Constitutional:      General: She is not in acute distress.    Appearance: She is well-developed. She is not diaphoretic.  HENT:     Head: Normocephalic  and atraumatic.  Eyes:     General: No scleral icterus.  Right eye: No discharge.        Left eye: No discharge.     Conjunctiva/sclera: Conjunctivae normal.  Cardiovascular:     Rate and Rhythm: Normal rate and regular rhythm.  Pulmonary:     Effort: Pulmonary effort is normal. No respiratory distress.  Musculoskeletal:     Cervical back: Normal range of motion.     Comments: LLE No traumatic wounds, ecchymosis, or rash  Mod TTP knee  No ankle effusion  Sens DPN, SPN, TN intact  Motor EHL, ext, flex, evers 5/5  DP 2+, PT 2+, No significant edema  Skin:    General: Skin is warm and dry.  Neurological:     Mental Status: She is alert.  Psychiatric:        Mood and Affect: Mood normal.        Behavior: Behavior normal.     Assessment/Plan: Left distal femur fx -- Plan ORIF today with Dr. Kendal. Please keep NPO. Multiple medical problems including degenerative disc disease, osteoporosis, hypertension, and scoliosis -- per primary service    Ozell DOROTHA Ned, PA-C Orthopedic Surgery [phone] 12/26/2023, 9:06 AM

## 2023-12-26 NOTE — Interval H&P Note (Signed)
 History and Physical Interval Note:  12/26/2023 12:24 PM  Christina Lyons  has presented today for surgery, with the diagnosis of Left distal femur fracture.  The various methods of treatment have been discussed with the patient and family. After consideration of risks, benefits and other options for treatment, the patient has consented to  Procedure(s): OPEN REDUCTION INTERNAL FIXATION (ORIF) DISTAL FEMUR FRACTURE (Left) as a surgical intervention.  The patient's history has been reviewed, patient examined, no change in status, stable for surgery.  I have reviewed the patient's chart and labs.  Questions were answered to the patient's satisfaction.     Ajene Carchi P Hara Milholland

## 2023-12-26 NOTE — Plan of Care (Signed)

## 2023-12-26 NOTE — Anesthesia Preprocedure Evaluation (Signed)
 Anesthesia Evaluation  Patient identified by MRN, date of birth, ID band Patient awake    Reviewed: Allergy & Precautions, NPO status , Patient's Chart, lab work & pertinent test results  Airway Mallampati: I  TM Distance: >3 FB     Dental no notable dental hx. (+) Dental Advisory Given   Pulmonary COPD,  COPD inhaler, former smoker   Pulmonary exam normal breath sounds clear to auscultation       Cardiovascular hypertension, Pt. on medications Normal cardiovascular exam Rhythm:Regular Rate:Normal     Neuro/Psych negative neurological ROS  negative psych ROS   GI/Hepatic Neg liver ROS,GERD  ,,  Endo/Other  Obesity HLD  Renal/GU negative Renal ROS  negative genitourinary   Musculoskeletal  (+) Arthritis , Osteoarthritis,  Left distal femur Fx DDD lumbar spine Thoracolumbar scoliosis   Abdominal  (+) + obese  Peds  Hematology  (+) Blood dyscrasia, anemia   Anesthesia Other Findings   Reproductive/Obstetrics                              Anesthesia Physical Anesthesia Plan  ASA: 3  Anesthesia Plan: General   Post-op Pain Management: Dilaudid  IV   Induction:   PONV Risk Score and Plan: 3 and Treatment may vary due to age or medical condition, Propofol  infusion, Ondansetron  and Dexamethasone   Airway Management Planned: Oral ETT  Additional Equipment: None  Intra-op Plan:   Post-operative Plan: Extubation in OR  Informed Consent: I have reviewed the patients History and Physical, chart, labs and discussed the procedure including the risks, benefits and alternatives for the proposed anesthesia with the patient or authorized representative who has indicated his/her understanding and acceptance.     Dental advisory given  Plan Discussed with:   Anesthesia Plan Comments:          Anesthesia Quick Evaluation

## 2023-12-26 NOTE — Progress Notes (Signed)
 PROGRESS NOTE    TAL NEER  FMW:[id number] DOB: Sep 25, 1940 DOA: 12/25/2023 PCP: Duanne Butler DASEN, MD   Brief Narrative:  This 83 years old female with PMH significant for bilateral knee replacement, GERD, hypertension, hyperlipidemia, osteoporosis, right breast cancer, heart murmur presented status post mechanical fall, hit her head but denies any loss of consciousness, not on any anticoagulation.  She has developed pain in the left knee.  She has stopped taking her blood pressure and hyperlipidemia medication 4 months ago.  Imaging confirms closed fracture of left distal femur.  Patient admitted for further evaluation,  orthopedics consulted and scheduled for ORIF today.   Assessment & Plan:   Principal Problem:   Closed fracture of left distal femur (HCC) Active Problems:   Hyperlipidemia   Hypertension   Malignant neoplasm of upper-outer quadrant of right breast in female, estrogen receptor positive (HCC)   Closed fracture of left distal femur: Status post mechanical fall: Patient presented status post mechanical fall with left leg pain. X-ray confirms left distal femur fracture. Continue adequate pain control. Orthopedics is consulted.  Patient is scheduled for ORIF today. PT/OT evaluation likely needs SNF.  Hyperlipidemia: Patient is not on any statins. Check lipid profile.  Hypertension: Patient has been off of blood pressure medications.  Malignant neoplasm of the upper outer quadrant of right breast: Hold Femara  for tonight. Follow-up outpatient.  DVT prophylaxis: SCDs Code Status: Full code Family Communication: No family at bedside Disposition Plan:    Status is: Inpatient Remains inpatient appropriate because: Admitted status post mechanical fall with closed fracture of left distal femur. Ortho was consulted patient is scheduled for ORIF today.   Consultants:  Orthopedics  Procedures:  Scheduled ORIF today Antimicrobials:  Anti-infectives (From  admission, onward)    Start     Dose/Rate Route Frequency Ordered Stop   12/26/23 1029  ceFAZolin  (ANCEF ) 2-4 GM/100ML-% IVPB  Status:  Discontinued       Note to Pharmacy: [facility], [place]: cabinet override      12/26/23 1029 12/26/23 1056   12/26/23 1015  ceFAZolin  (ANCEF ) IVPB 2g/100 mL premix        2 g 200 mL/hr over 30 Minutes Intravenous On call to O.R. 12/26/23 9072 12/27/23 0559      Subjective: Patient was seen and examined at bedside.  Overnight events noted. Patient reports pain is reasonably controlled,  She just has received morphine .   She is scheduled to have ORIF today.   She is anticipating Fast recovery.  Objective: Vitals:   12/25/23 2212 12/26/23 0506 12/26/23 0759 12/26/23 1052  BP: (!) 172/95 (!) 146/71 131/76 (!) 162/79  Pulse: 95 91 87 91  Resp: 17 16 19 18   Temp: 98.4 F (36.9 C) 98.5 F (36.9 C) 98.3 F (36.8 C) 98.4 F (36.9 C)  TempSrc: Oral Oral Oral Oral  SpO2: 90% 92% 90% 91%  Weight:    88 kg  Height:    5' (1.524 m)   No intake or output data in the 24 hours ending 12/26/23 1134 Filed Weights   12/26/23 1052  Weight: 88 kg    Examination:  General exam: Appears calm and comfortable, not in any acute distress.  Deconditioned. Respiratory system: Clear to auscultation. Respiratory effort normal.  RR 14 Cardiovascular system: S1 & S2 heard, RRR. No JVD, murmurs, rubs, gallops or clicks.  Gastrointestinal system: Abdomen is non distended, soft and non tender.Normal bowel sounds heard. Central nervous system: Alert and oriented x 3. No focal neurological  deficits. Extremities: Left knee tenderness, reduced movements. Skin: No rashes, lesions or ulcers Psychiatry: Judgement and insight appear normal. Mood & affect appropriate.     Data Reviewed: I have personally reviewed following labs and imaging studies  CBC: Recent Labs  Lab 12/25/23 1705 12/26/23 0329  WBC 9.0 7.5  NEUTROABS 7.3  --   HGB 13.9 12.7  HCT 42.4 37.5  MCV 103.9*  102.2*  PLT 153 133*   Basic Metabolic Panel: Recent Labs  Lab 12/25/23 1705  NA 139  K 4.5  CL 102  CO2 25  GLUCOSE 92  BUN 14  CREATININE 0.56  CALCIUM  9.3   GFR: Estimated Creatinine Clearance: 55.3 mL/min (by C-G formula based on SCr of 0.56 mg/dL). Liver Function Tests: No results for input(s): AST, ALT, ALKPHOS, BILITOT, PROT, ALBUMIN in the last 168 hours. No results for input(s): LIPASE, AMYLASE in the last 168 hours. No results for input(s): AMMONIA in the last 168 hours. Coagulation Profile: No results for input(s): INR, PROTIME in the last 168 hours. Cardiac Enzymes: No results for input(s): CKTOTAL, CKMB, CKMBINDEX, TROPONINI in the last 168 hours. BNP (last 3 results) No results for input(s): PROBNP in the last 8760 hours. HbA1C: No results for input(s): HGBA1C in the last 72 hours. CBG: No results for input(s): GLUCAP in the last 168 hours. Lipid Profile: No results for input(s): CHOL, HDL, LDLCALC, TRIG, CHOLHDL, LDLDIRECT in the last 72 hours. Thyroid  Function Tests: No results for input(s): TSH, T4TOTAL, FREET4, T3FREE, THYROIDAB in the last 72 hours. Anemia Panel: No results for input(s): VITAMINB12, FOLATE, FERRITIN, TIBC, IRON, RETICCTPCT in the last 72 hours. Sepsis Labs: No results for input(s): PROCALCITON, LATICACIDVEN in the last 168 hours.  No results found for this or any previous visit (from the past 240 hours).   Radiology Studies: DG Chest Portable 1 View Result Date: 12/25/2023 CLINICAL DATA:  preop EXAM: PORTABLE CHEST 1 VIEW COMPARISON:  Chest x-ray 04/16/2019 FINDINGS: The heart and mediastinal contours are within normal limits. Elevated left hemidiaphragm. No focal consolidation. No pulmonary edema. No pleural effusion. No pneumothorax. No acute osseous abnormality. IMPRESSION: No active disease. Electronically Signed   By: Morgane  Naveau M.D.   On: 12/25/2023  19:53   CT Head Wo Contrast Result Date: 12/25/2023 CLINICAL DATA:  Head trauma, minor, normal mental status (Age 45-64y). Trip and fall EXAM: CT HEAD WITHOUT CONTRAST TECHNIQUE: Contiguous axial images were obtained from the base of the skull through the vertex without intravenous contrast. RADIATION DOSE REDUCTION: This exam was performed according to the departmental dose-optimization program which includes automated exposure control, adjustment of the mA and/or kV according to patient size and/or use of iterative reconstruction technique. COMPARISON:  Ct max face 12/26/14, CT head 01/14/09 FINDINGS: Brain: Cerebral ventricle sizes are concordant with the degree of cerebral volume loss. Patchy and confluent areas of decreased attenuation are noted throughout the deep and periventricular white matter of the cerebral hemispheres bilaterally, compatible with chronic microvascular ischemic disease. Bilateral basal ganglia mineralization. No evidence of large-territorial acute infarction. No parenchymal hemorrhage. No mass lesion. No extra-axial collection. No mass effect or midline shift. No hydrocephalus. Basilar cisterns are patent. Vascular: No hyperdense vessel. Atherosclerotic calcifications are present within the cavernous internal carotid arteries. Skull: No acute fracture or focal lesion. Sinuses/Orbits: Paranasal sinuses and mastoid air cells are clear. Bilateral lens replacement. Otherwise the orbits are unremarkable. Other: None. IMPRESSION: No acute intracranial abnormality. Electronically Signed   By: Morgane  Naveau M.D.   On:  12/25/2023 19:44   DG Knee Complete 4 Views Left Result Date: 12/25/2023 CLINICAL DATA:  Trip and fall injury with left knee swelling and pain. EXAM: LEFT KNEE - COMPLETE 4+ VIEW COMPARISON:  05/29/2021 FINDINGS: Postoperative changes with left total knee arthroplasty including patellar femoral component. There is an acute transverse periprosthetic fracture of the distal left  femoral metaphysis with impaction and about 1.5 cm posterior displacement of the distal fracture fragment. Moderate left knee effusion. The proximal tibia and fibula appear intact. IMPRESSION: Left total knee arthroplasty. Acute periprosthetic fracture of the distal femoral metaphysis with posterior displacement of fracture fragments. Moderate effusion. Electronically Signed   By: Elsie Gravely M.D.   On: 12/25/2023 18:32   DG Hip Unilat W or Wo Pelvis 2-3 Views Left Result Date: 12/25/2023 CLINICAL DATA:  fall EXAM: DG HIP (WITH OR WITHOUT PELVIS) 2-3V LEFT COMPARISON:  None Available. FINDINGS: Osteopenia.No evidence of pelvic fracture or diastasis.No acute hip fracture or dislocation.Mild-to-moderate joint space loss of both hips. Degenerative disc disease in the lower lumbar spine. Soft tissues are unremarkable. IMPRESSION: No acute fracture, pelvic bone diastasis, or dislocation. Electronically Signed   By: Rogelia Myers M.D.   On: 12/25/2023 18:29   Scheduled Meds:  chlorhexidine   60 mL Topical Once   [MAR Hold] pantoprazole   40 mg Oral Daily   Continuous Infusions:   ceFAZolin  (ANCEF ) IV       LOS: 1 day    Time spent: 50 mins    Darcel Dawley, MD [facility]   If 7PM-7AM, please contact night-coverage

## 2023-12-26 NOTE — TOC CAGE-AID Note (Signed)
 Transition of Care [facility]) - CAGE-AID Screening   Patient Details  Name: Christina Lyons MRN: [id number] Date of Birth: 09/15/1940  Transition of Care [facility]) CM/SW Contact:    Thea Holshouser E Sameeha Rockefeller, LCSW Phone Number: 12/26/2023, 9:07 AM   Clinical Narrative: No SA noted.   CAGE-AID Screening:    Have You Ever Felt You Ought to Cut Down on Your Drinking or Drug Use?: No Have People Annoyed You By Critizing Your Drinking Or Drug Use?: No Have You Felt Bad Or Guilty About Your Drinking Or Drug Use?: No      Substance Abuse Education Offered: No

## 2023-12-26 NOTE — Progress Notes (Signed)
 Initial Nutrition Assessment  DOCUMENTATION CODES:   Obesity unspecified  INTERVENTION:  When diet is advanced following surgery, recommend: Liberalized regular diet to provide increased options and promote adequate intake of calories and protein for post op healing Ensure Plus High Protein po BID, each supplement provides 350 kcal and 20 grams of protein.  Attached nutrition discharge instructions to AVS  NUTRITION DIAGNOSIS:   Increased nutrient needs related to post-op healing as evidenced by estimated needs.  GOAL:   Patient will meet greater than or equal to 90% of their needs  MONITOR:   PO intake, Supplement acceptance, Diet advancement  REASON FOR ASSESSMENT:   Consult Assessment of nutrition requirement/status, Hip fracture protocol  ASSESSMENT:   Pt with hx of  GERD, HTN, HLD, osteoporosis, and R breast cancer. Hx of L knee replacement (07/2022) and breast lumpectomy (08/2020). Admitted after mechanical fall, diagnosed L distal femur fx.  9/25 admitted 9/26 ORIF L distal femur  Pt unavailable at time of assessment, in OR for surgery. Unable to obtain diet and wt hx at this time. No significant wt changes seen in chart review, wt appears stable. Pt had been living at home PTA and suffered mechanical fall at home. Pt would benefit from nutrition focused physical exam at follow. Will recommend supplements and liberalized diet to promote increased intake following surgery.   Medications: Protonix  Cefazolin    Labs reviewed   NUTRITION - FOCUSED PHYSICAL EXAM: Deferred to follow up  Diet Order:   Diet Order             Diet NPO time specified Except for: Sips with Meds, Ice Chips  Diet effective now                   EDUCATION NEEDS:   Not appropriate for education at this time  Skin:  Skin Assessment: Reviewed RN Assessment  Last BM:  PTA  Height:   Ht Readings from Last 1 Encounters:  12/26/23 5' (1.524 m)    Weight:   Wt Readings from  Last 1 Encounters:  12/26/23 88 kg    Ideal Body Weight:  50 kg  BMI:  Body mass index is 37.89 kg/m.  Estimated Nutritional Needs:   Kcal:  1400-1600  Protein:  60-80g  Fluid:  1.4-1.6L    Josette Glance, MS, RDN, LDN Clinical Dietitian I Please reach out via secure chat

## 2023-12-26 NOTE — Discharge Instructions (Addendum)
 [facility] Discharge Instructions   General Discharge Instructions  WEIGHT BEARING STATUS: Weightbearing as tolerated left lower extremity  RANGE OF MOTION/ACTIVITY: Okay for unrestricted range of motion  Wound Care: You may remove your surgical dressing on postoperative day #3 (Monday, 12/29/2023). Incisions can be left open to air if there is no drainage. Once the incision is completely dry and without drainage, it may be left open to air out.  Showering may begin postoperative day #4 (Tuesday, 12/30/2023) as long as there is no drainage from incision.  Clean incision gently with soap and water.  DVT/PE prophylaxis: Eliquis 2.5 mg twice daily x 30 days  Diet: as you were eating previously.  Can use over the counter stool softeners and bowel preparations, such as Miralax , to help with bowel movements.  Narcotics can be constipating.  Be sure to drink plenty of fluids  PAIN MEDICATION USE AND EXPECTATIONS  You have likely been given narcotic medications to help control your pain.  After a traumatic event that results in an fracture (broken bone) with or without surgery, it is ok to use narcotic pain medications to help control one's pain.  We understand that everyone responds to pain differently and each individual patient will be evaluated on a regular basis for the continued need for narcotic medications. Ideally, narcotic medication use should last no more than 6-8 weeks (coinciding with fracture healing).   As a patient it is your responsibility as well to monitor narcotic medication use and report the amount and frequency you use these medications when you come to your office visit.   We would also advise that if you are using narcotic medications, you should take a dose prior to therapy to maximize you participation.  IF YOU ARE ON NARCOTIC MEDICATIONS IT IS NOT PERMISSIBLE TO OPERATE A MOTOR VEHICLE (MOTORCYCLE/CAR/TRUCK/MOPED) OR HEAVY MACHINERY DO NOT MIX NARCOTICS  WITH OTHER CNS (CENTRAL NERVOUS SYSTEM) DEPRESSANTS SUCH AS ALCOHOL   POST-OPERATIVE OPIOID TAPER INSTRUCTIONS: It is important to wean off of your opioid medication as soon as possible. If you do not need pain medication after your surgery it is ok to stop day one. Opioids include: Codeine , Hydrocodone (Norco, Vicodin), Oxycodone (Percocet, oxycontin ) and hydromorphone  amongst others.  Long term and even short term use of opiods can cause: Increased pain response Dependence Constipation Depression Respiratory depression And more.  Withdrawal symptoms can include Flu like symptoms Nausea, vomiting And more Techniques to manage these symptoms Hydrate well Eat regular healthy meals Stay active Use relaxation techniques(deep breathing, meditating, yoga) Do Not substitute Alcohol  to help with tapering If you have been on opioids for less than two weeks and do not have pain than it is ok to stop all together.  Plan to wean off of opioids This plan should start within one week post op of your fracture surgery  Maintain the same interval or time between taking each dose and first decrease the dose.  Cut the total daily intake of opioids by one tablet each day Next start to increase the time between doses. The last dose that should be eliminated is the evening dose.    STOP SMOKING OR USING NICOTINE PRODUCTS!!!!  As discussed nicotine severely impairs your body's ability to heal surgical and traumatic wounds but also impairs bone healing.  Wounds and bone heal by forming microscopic blood vessels (angiogenesis) and nicotine is a vasoconstrictor (essentially, shrinks blood vessels).  Therefore, if vasoconstriction occurs to these microscopic blood vessels they essentially disappear and are unable to  deliver necessary nutrients to the healing tissue.  This is one modifiable factor that you can do to dramatically increase your chances of healing your injury.  (This means no smoking, no nicotine  gum, patches, etc)  DO NOT USE NONSTEROIDAL ANTI-INFLAMMATORY DRUGS (NSAID'S)  Using products such as Advil (ibuprofen), Aleve (naproxen), Motrin (ibuprofen) for additional pain control during fracture healing can delay and/or prevent the healing response.  If you would like to take over the counter (OTC) medication, Tylenol  (acetaminophen ) is ok.  However, some narcotic medications that are given for pain control contain acetaminophen  as well. Therefore, you should not exceed more than 4000 mg of tylenol  in a day if you do not have liver disease.  Also note that there are may OTC medicines, such as cold medicines and allergy medicines that my contain tylenol  as well.  If you have any questions about medications and/or interactions please ask your doctor/PA or your pharmacist.      ICE AND ELEVATE INJURED/OPERATIVE EXTREMITY  Using ice and elevating the injured extremity above your heart can help with swelling and pain control.  Icing in a pulsatile fashion, such as 20 minutes on and 20 minutes off, can be followed.    Do not place ice directly on skin. Make sure there is a barrier between to skin and the ice pack.    Using frozen items such as frozen peas works well as the conform nicely to the are that needs to be iced.  USE AN ACE WRAP OR TED HOSE FOR SWELLING CONTROL  In addition to icing and elevation, Ace wraps or TED hose are used to help limit and resolve swelling.  It is recommended to use Ace wraps or TED hose until you are informed to stop.    When using Ace Wraps start the wrapping distally (farthest away from the body) and wrap proximally (closer to the body)   Example: If you had surgery on your leg or thing and you do not have a splint on, start the ace wrap at the toes and work your way up to the thigh        If you had surgery on your upper extremity and do not have a splint on, start the ace wrap at your fingers and work your way up to the upper arm   CALL THE OFFICE FOR  MEDICATION REFILLS OR WITH ANY QUESTIONS/CONCERNS: [phone]   VISIT OUR WEBSITE FOR ADDITIONAL INFORMATION: [email or web address]   Discharge Wound Care Instructions  Do NOT apply any ointments, solutions or lotions to pin sites or surgical wounds.  These prevent needed drainage and even though solutions like hydrogen peroxide kill bacteria, they also damage cells lining the pin sites that help fight infection.  Applying lotions or ointments can keep the wounds moist and can cause them to breakdown and open up as well. This can increase the risk for infection. When in doubt call the office.  Surgical incisions should be dressed daily.  If any drainage is noted, use one layer of adaptic or Mepitel, then gauze, Kerlix, and an ace wrap. - These dressing supplies should be available at local medical supply stores ([facility], [facility], etc) as well as Insurance claims handler ([facility], [facility], [employer], etc)  Once the incision is completely dry and without drainage, it may be left open to air out.  Showering may begin 36-48 hours later.  Cleaning gently with soap and water.   Call office for the following: Temperature greater than 101F Persistent nausea  and vomiting Severe uncontrolled pain Redness, tenderness, or signs of infection (pain, swelling, redness, odor or green/yellow discharge around the site) Difficulty breathing, headache or visual disturbances Hives Persistent dizziness or light-headedness Extreme fatigue Any other questions or concerns you may have after discharge  In an emergency, call 911 or go to an Emergency Department at a nearby hospital  OTHER HELPFUL INFORMATION  If you had a block, it will wear off between 8-24 hrs postop typically.  This is period when your pain may go from nearly zero to the pain you would have had postop without the block.  This is an abrupt transition but nothing dangerous is happening.  You may take an extra dose of narcotic when this  happens.  You should wean off your narcotic medicines as soon as you are able.  Most patients will be off or using minimal narcotics before their first postop appointment.   We suggest you use the pain medication the first night prior to going to bed, in order to ease any pain when the anesthesia wears off. You should avoid taking pain medications on an empty stomach as it will make you nauseous.  Do not drink alcoholic beverages or take illicit drugs when taking pain medications.  In most states it is against the law to drive while you are in a splint or sling.  And certainly against the law to drive while taking narcotics.  You may return to work/school in the next couple of days when you feel up to it.   Pain medication may make you constipated.  Below are a few solutions to try in this order: Decrease the amount of pain medication if you aren't having pain. Drink lots of decaffeinated fluids. Drink prune juice and/or each dried prunes  If the first 3 don't work start with additional solutions Take Colace - an over-the-counter stool softener Take Senokot - an over-the-counter laxative Take Miralax  - a stronger over-the-counter laxative       Nutrition Post Hospital Stay Proper nutrition can help your body recover from illness and injury.   Foods and beverages high in protein, vitamins, and minerals help rebuild muscle loss, promote healing, & reduce fall risk.   In addition to eating healthy foods, a nutrition shake is an easy, delicious way to get the nutrition you need during and after your hospital stay  It is recommended that you continue to drink 2 bottles per day of:       Ensure/Boost for at least 1 month (30 days) after your hospital stay   Tips for adding a nutrition shake into your routine: As allowed, drink one with vitamins or medications instead of water or juice Enjoy one as a tasty mid-morning or afternoon snack Drink cold or make a milkshake out of it Drink  one instead of milk with cereal or snacks Use as a coffee creamer   Available at the following grocery stores and pharmacies:           * [place] [facility] * [employer] * [employer]  * [facility] [facility]          * [employer] * [employer]  * [facility]      * Target  * [employer]   * [facility]  * [employer]   * [facility] [phone]            For COUPONS visit: [email or web address]/join or [email or web address]   Suggested Substitutions Ensure Plus = Boost Plus = Carnation Breakfast Essentials = Boost  Compact Ensure Active Clear = Boost Breeze Glucerna Shake = Boost Glucose Control = Carnation Breakfast Essentials SUGAR FREE

## 2023-12-26 NOTE — Progress Notes (Signed)
 I tried calling pt's son [name],  left a message and a call back number.

## 2023-12-27 DIAGNOSIS — S72402A Unspecified fracture of lower end of left femur, initial encounter for closed fracture: Secondary | ICD-10-CM | POA: Diagnosis not present

## 2023-12-27 LAB — CBC
HCT: 30 % — ABNORMAL LOW (ref 36.0–46.0)
Hemoglobin: 9.9 g/dL — ABNORMAL LOW (ref 12.0–15.0)
MCH: 34.1 pg — ABNORMAL HIGH (ref 26.0–34.0)
MCHC: 33 g/dL (ref 30.0–36.0)
MCV: 103.4 fL — ABNORMAL HIGH (ref 80.0–100.0)
Platelets: 127 K/uL — ABNORMAL LOW (ref 150–400)
RBC: 2.9 MIL/uL — ABNORMAL LOW (ref 3.87–5.11)
RDW: 13.1 % (ref 11.5–15.5)
WBC: 11.5 K/uL — ABNORMAL HIGH (ref 4.0–10.5)
nRBC: 0 % (ref 0.0–0.2)

## 2023-12-27 LAB — BASIC METABOLIC PANEL WITH GFR
Anion gap: 11 (ref 5–15)
BUN: 12 mg/dL (ref 8–23)
CO2: 25 mmol/L (ref 22–32)
Calcium: 8.2 mg/dL — ABNORMAL LOW (ref 8.9–10.3)
Chloride: 102 mmol/L (ref 98–111)
Creatinine, Ser: 0.68 mg/dL (ref 0.44–1.00)
GFR, Estimated: >60 mL/min (ref >60)
Glucose, Bld: 121 mg/dL — ABNORMAL HIGH (ref 70–99)
Potassium: 3.6 mmol/L (ref 3.5–5.1)
Sodium: 138 mmol/L (ref 135–145)

## 2023-12-27 MED ORDER — VITAMIN D 25 MCG (1000 UNIT) PO TABS
4000.0000 [IU] | ORAL_TABLET | Freq: Every day | ORAL | Status: DC
  Administered 2023-12-27 – 2023-12-31 (×5): 4000 [IU] via ORAL
  Filled 2023-12-27 (×8): qty 4

## 2023-12-27 NOTE — Evaluation (Signed)
 Physical Therapy Evaluation Patient Details Name: Christina Lyons MRN: [id number] DOB: [birth year] Today's Date: 12/27/2023  History of Present Illness  Christina Lyons is a 83 y.o. female admitted 12/24/24 after ground-level fall, sustaining left periprosthetic supracondylar distal femur fracture. Pt s/p ORIF 9/26. PMHx: DDD, GERD, HLD, HTN, osteoporosis, idiopathic thoracolumbar scoliosis, right breast cancer, heart murmur, and B TKA.   Clinical Impression  Pt admitted with above diagnosis. PTA, pt was modI for functional mobility furniture walking within the household and using a SPC in the community. She is independent with ADLs and most IADLs. Pt lives with her son in a one story house with a ramped entrance. Pt currently with functional limitations due to the deficits listed below (see PT Problem List). She required CGA-minA for bed mobility, modA for sit<>stand using RW, and modA for lateral scoot bed>chair transfer. Pt was unable to take steps or pivot on her feet. Educated pt on LLE HEP and provided handout. She is highly motivated to regain her PLOF and very receptive to education and cues. Pt will benefit from acute skilled PT to increase her independence and safety with mobility to allow discharge. Recommend intensive inpatient follow-up therapy, >3 hours/day.    If plan is discharge home, recommend the following: Two people to help with walking and/or transfers;A lot of help with bathing/dressing/bathroom;Assistance with cooking/housework;Assist for transportation;Help with stairs or ramp for entrance   Can travel by private vehicle        Equipment Recommendations Wheelchair (measurements PT);Wheelchair cushion (measurements PT);Other (comment);BSC/3in1 (drop arm)  Recommendations for Other Services  Rehab consult    Functional Status Assessment Patient has had a recent decline in their functional status and demonstrates the ability to make significant improvements in function in a  reasonable and predictable amount of time.     Precautions / Restrictions Precautions Precautions: Fall Recall of Precautions/Restrictions: Intact Restrictions Weight Bearing Restrictions Per Provider Order: Yes LLE Weight Bearing Per Provider Order: Weight bearing as tolerated      Mobility  Bed Mobility Overal bed mobility: Needs Assistance Bed Mobility: Supine to Sit     Supine to sit: Contact guard, Min assist, HOB elevated, Used rails     General bed mobility comments: Pt sat up on R side of bed with increased time. Cues for seqeuencing. She brought LLE towards EOB with light CGA to clear the bed. Assist to elevate trunk and scoot hips fwd with use of bed pad.    Transfers Overall transfer level: Needs assistance Equipment used: Rolling walker (2 wheels) Transfers: Sit to/from Stand, Bed to chair/wheelchair/BSC Sit to Stand: From elevated surface, Mod assist          Lateral/Scoot Transfers: Mod assist, From elevated surface General transfer comment: Pt stood from raised bed height. Cued proper hand placement using RW and increased knee flex bilat. Educated pt on use of momentum. Powered up with modA. Pt was unable to step or pivot on feet. Returned pt to sitting EOB. Brought recliner chair touching bed and dropped arm. Educated pt on head-hips relationship and liff, shift, lower technique. She scooted to her right to transfer to recliner chair with modA.    Ambulation/Gait               General Gait Details: Unable  Stairs            Wheelchair Mobility     Tilt Bed    Modified Rankin (Stroke Patients Only)       Balance Overall  balance assessment: Needs assistance Sitting-balance support: Bilateral upper extremity supported, Feet supported Sitting balance-Leahy Scale: Fair Sitting balance - Comments: Pt sat EOB with close supervision and engaged in scooting with modA   Standing balance support: Bilateral upper extremity supported, During  functional activity, Reliant on assistive device for balance Standing balance-Leahy Scale: Poor Standing balance comment: Pt dependent on RW                             Pertinent Vitals/Pain Pain Assessment Pain Assessment: 0-10 Pain Score: 5  Pain Location: L hip Pain Descriptors / Indicators: Discomfort, Aching, Sore Pain Intervention(s): Monitored during session    Home Living Family/patient expects to be discharged to:: Private residence Living Arrangements: Children (Son) Available Help at Discharge: Family;Available 24 hours/day Type of Home: House Home Access: Ramped entrance       Home Layout: One level Home Equipment: Shower seat;Grab bars - tub/shower;Hand held shower head;Standard Walker;Cane - single point      Prior Function Prior Level of Function : Independent/Modified Independent;Driving;History of Falls (last six months)             Mobility Comments: Pt reports furniture walking within the house and using SPC outside the home. 1 fall leading to current admission d/t trip. ADLs Comments: Indep with ADLs. Son manages household chores. Pt occassionally dusts, washs cloths, and light cooking. Manages her own medications. Drives.     Extremity/Trunk Assessment   Upper Extremity Assessment Upper Extremity Assessment: Defer to OT evaluation    Lower Extremity Assessment Lower Extremity Assessment: LLE deficits/detail LLE Deficits / Details: Pt POD 1 s/p L femur ORIF. She is in a dressing from her ankle to hip. Decreased hip and knee AROM, ankle AROM WFL. Grossly 3-/5 strength. LLE Sensation: WNL LLE Coordination: decreased gross motor    Cervical / Trunk Assessment Cervical / Trunk Assessment: Other exceptions Cervical / Trunk Exceptions: Thoracic Rounding  Communication   Communication Communication: No apparent difficulties    Cognition Arousal: Alert Behavior During Therapy: WFL for tasks assessed/performed   PT - Cognitive  impairments: No apparent impairments                       PT - Cognition Comments: Pt A,Ox4 Following commands: Intact       Cueing Cueing Techniques: Verbal cues     General Comments General comments (skin integrity, edema, etc.): Pt is experiencing urinary incontinence. Assisted with pericare.    Exercises Other Exercises Other Exercises: Educated pt on LLE HEP. Demonstrated each exercise and reviewed technique. Provided pt with handout.   Assessment/Plan    PT Assessment Patient needs continued PT services  PT Problem List Decreased strength;Decreased range of motion;Decreased activity tolerance;Decreased balance;Decreased mobility;Decreased knowledge of use of DME;Decreased coordination       PT Treatment Interventions DME instruction;Gait training;Functional mobility training;Therapeutic activities;Therapeutic exercise;Balance training;Patient/family education    PT Goals (Current goals can be found in the Care Plan section)  Acute Rehab PT Goals Patient Stated Goal: Regain independence and return home PT Goal Formulation: With patient Time For Goal Achievement: 01/10/24 Potential to Achieve Goals: Good    Frequency Min 3X/week     Co-evaluation               AM-PAC PT 6 Clicks Mobility  Outcome Measure Help needed turning from your back to your side while in a flat bed without using bedrails?: A Little Help  needed moving from lying on your back to sitting on the side of a flat bed without using bedrails?: A Little Help needed moving to and from a bed to a chair (including a wheelchair)?: A Lot Help needed standing up from a chair using your arms (e.g., wheelchair or bedside chair)?: A Lot Help needed to walk in hospital room?: Total Help needed climbing 3-5 steps with a railing? : Total 6 Click Score: 12    End of Session Equipment Utilized During Treatment: Gait belt Activity Tolerance: Patient tolerated treatment well Patient left: in  chair;with call bell/phone within reach;with chair alarm set Nurse Communication: Mobility status;Need for lift equipment (stedy) PT Visit Diagnosis: Difficulty in walking, not elsewhere classified (R26.2);Other abnormalities of gait and mobility (R26.89);Unsteadiness on feet (R26.81)    Time: 8841-8771 PT Time Calculation (min) (ACUTE ONLY): 30 min   Charges:   PT Evaluation $PT Eval Moderate Complexity: 1 Mod PT Treatments $Therapeutic Activity: 8-22 mins PT General Charges $$ ACUTE PT VISIT: 1 Visit         Randall SAUNDERS, PT, DPT Acute Rehabilitation Services Office: [phone] Secure Chat Preferred  Delon CHRISTELLA Callander 12/27/2023, 1:44 PM

## 2023-12-27 NOTE — Progress Notes (Signed)
 Orthopaedic Trauma Progress Note  SUBJECTIVE: Doing fairly well this morning.  Pain controlled.  Is not been out of bed yet since surgery.  Denies any numbness or tingling throughout operative extremity.  No chest pain. No SOB. No nausea/vomiting. No other complaints.  No family at bedside currently.  OBJECTIVE:  Vitals:   12/26/23 1947 12/27/23 0413  BP: 135/75 120/66  Pulse:  100  Resp: 16 15  Temp: 97.7 F (36.5 C) 98.7 F (37.1 C)  SpO2: 94% (!) 86%    Opiates Today (MME): Today's  total administered Morphine  Milligram Equivalents: 10 Opiates Yesterday (MME): Yesterday's total administered Morphine  Milligram Equivalents: 128  General: Sitting up in bed eating breakfast, no acute distress.  Pleasant and cooperative Respiratory: No increased work of breathing.  Operative Extremity (left lower extremity): Dressing clean, dry, intact.  Some soreness with palpation through the mid to distal thigh as expected.  No significant calf tenderness.  Nontender over the hip.  Ankle DF/PF intact.  Compartments are soft compressible.  Toes warm and well-perfused. + DP pulse  IMAGING: Stable post op imaging left femur  LABS:  Results for orders placed or performed during the hospital encounter of 12/25/23 (from the past 24 hours)  Basic metabolic panel     Status: Abnormal   Collection Time: 12/27/23  5:58 AM  Result Value Ref Range   Sodium 138 135 - 145 mmol/L   Potassium 3.6 3.5 - 5.1 mmol/L   Chloride 102 98 - 111 mmol/L   CO2 25 22 - 32 mmol/L   Glucose, Bld 121 (H) 70 - 99 mg/dL   BUN 12 8 - 23 mg/dL   Creatinine, Ser 9.31 0.44 - 1.00 mg/dL   Calcium  8.2 (L) 8.9 - 10.3 mg/dL   GFR, Estimated >39 >39 mL/min   Anion gap 11 5 - 15  CBC     Status: Abnormal   Collection Time: 12/27/23  5:58 AM  Result Value Ref Range   WBC 11.5 (H) 4.0 - 10.5 K/uL   RBC 2.90 (L) 3.87 - 5.11 MIL/uL   Hemoglobin 9.9 (L) 12.0 - 15.0 g/dL   HCT 69.9 (L) 63.9 - 53.9 %   MCV 103.4 (H) 80.0 - 100.0 fL    MCH 34.1 (H) 26.0 - 34.0 pg   MCHC 33.0 30.0 - 36.0 g/dL   RDW 86.8 88.4 - 84.4 %   Platelets 127 (L) 150 - 400 K/uL   nRBC 0.0 0.0 - 0.2 %    ASSESSMENT: Christina Lyons is a 83 y.o. female, 1 Day Post-Op s/p fall at home Procedures: OPEN REDUCTION INTERNAL FIXATION LEFT DISTAL FEMUR FRACTURE  CV/Blood loss: Acute blood loss anemia, Hgb 9.9 this AM. Hemodynamically stable  PLAN: Weightbearing: WBAT LLE ROM: Unrestricted ROM Incisional and dressing care: Reinforce dressings as needed  Showering: Okay begin getting incisions wet starting 12/30/2023 Orthopedic device(s): None  Pain management:  1. Tylenol  325-650 mg q 6 hours scheduled 2. Robaxin  500 mg q 6 hours PRN 3. Norco 5-325 or 7.5-325 mg q 4 hours PRN 4. Morphine  0.5 mg q 3 hours PRN VTE prophylaxis: Lovenox , SCDs ID:  Ancef  2gm post op Foley/Lines:  No foley, KVO IVFs Impediments to Fracture Healing: Vitamin D  level 28, continue on home dose vitamin D  supplementation 4000 units daily  Dispo: PT/OT evaluation today, dispo pending.  Patient will likely need SNF at discharge.  Ortho issues stable.  Okay for discharge from ortho standpoint once cleared by medicine team and therapies  D/C recommendations: - Norco, Robaxin , Tylenol  as needed for pain control - Eliquis 2.5 mg twice daily x 30 days for DVT prophylaxis - Continue home dose Vit D supplementation  Follow - up plan: 2 weeks after d/c for wound check and repeat x-rays   Contact information:  Franky Light MD, Lauraine Moores PA-C. After hours and holidays please check [email or web address] for group call information for [facility]   Lauraine PATRIC Moores, PA-C [phone] (office) [email or web address]

## 2023-12-27 NOTE — Progress Notes (Signed)
 PROGRESS NOTE    Christina Lyons  FMW:[id number] DOB: 06/11/1940 DOA: 12/25/2023 PCP: Christina Lyons   Brief Narrative:  This 83 years old female with PMH significant for bilateral knee replacement, GERD, hypertension, hyperlipidemia, osteoporosis, right breast cancer, heart murmur presented status post mechanical fall, hit her head but denies any loss of consciousness, not on any anticoagulation.  She has developed pain in the left knee.  She has stopped taking her blood pressure and hyperlipidemia medication 4 months ago.  Imaging confirms closed fracture of left distal femur.  Patient admitted for further evaluation,  orthopedics consulted , status post ORIF POD #1.   Assessment & Plan:   Principal Problem:   Closed fracture of left distal femur (HCC) Active Problems:   Hyperlipidemia   Hypertension   Malignant neoplasm of upper-outer quadrant of right breast in female, estrogen receptor positive (HCC)   Closed fracture of left distal femur: Status post mechanical fall: Patient presented status post mechanical fall with left leg pain. X-ray confirms left distal femur fracture. Continue adequate pain control. Orthopedics is consulted. Status post ORIF POD #1. PT/OT evaluation pending, likely needs SNF. WBAT LLE, adequate pain control. Ortho signed off , can be discharged once SNF available. Follow-up 2 weeks in office for wound check and repeat x-ray.  Hyperlipidemia: Patient is not on any statins. Check lipid profile.  Hypertension: Patient has been off of blood pressure medications. BP controlled off medications.  Malignant neoplasm of the upper outer quadrant of right breast: Hold Femara  for tonight. Follow-up outpatient.  DVT prophylaxis: SCDs Code Status: Full code Family Communication: No family at bedside. Disposition Plan:    Status is: Inpatient Remains inpatient appropriate because: Admitted status post mechanical fall with closed fracture of left  distal femur. Status post ORIF POD #1.  PT/ OT pending ,  TOC notified for possible SNF placement   Consultants:  Orthopedics  Procedures:  S/P ORIF POD #1 Antimicrobials:  Anti-infectives (From admission, onward)    Start     Dose/Rate Route Frequency Ordered Stop   12/26/23 1900  ceFAZolin  (ANCEF ) IVPB 2g/100 mL premix        2 g 200 mL/hr over 30 Minutes Intravenous Every 8 hours 12/26/23 1553 12/27/23 2159   12/26/23 1304  vancomycin  (VANCOCIN ) powder  Status:  Discontinued          As needed 12/26/23 1304 12/26/23 1346   12/26/23 1029  ceFAZolin  (ANCEF ) 2-4 GM/100ML-% IVPB  Status:  Discontinued       Note to Pharmacy: [facility], Christina Lyons      12/26/23 1029 12/26/23 1056   12/26/23 1015  ceFAZolin  (ANCEF ) IVPB 2g/100 mL premix        2 g 200 mL/hr over 30 Minutes Intravenous On call to O.R. 12/26/23 0927 12/26/23 1243      Subjective: Patient was seen and examined at bedside.Overnight events noted. Patient reports pain is reasonably controlled, She has not participated therapy yet. She is status post ORIF POD #1.  Objective: Vitals:   12/26/23 1553 12/26/23 1556 12/26/23 1947 12/27/23 0413  BP: (!) 163/90  135/75 120/66  Pulse: (!) 108   100  Resp: 16  16 15   Temp: 98.2 F (36.8 C)  97.7 F (36.5 C) 98.7 F (37.1 C)  TempSrc: Oral  Oral Oral  SpO2: (!) 84% 93% 94% (!) 86%  Weight:      Height:        Intake/Output Summary (Last 24 hours) at  12/27/2023 1053 Last data filed at 12/27/2023 0900 Gross per 24 hour  Intake 600 ml  Output 800 ml  Net -200 ml   Filed Weights   12/26/23 1052  Weight: 88 kg    Examination:  General exam: Appears calm and comfortable, not in any acute distress.  Deconditioned. Respiratory system: CTA Bilaterally.  Respiratory effort normal.  RR 14 Cardiovascular system: S1 & S2 heard, RRR. No JVD, murmurs, rubs, gallops or clicks.  Gastrointestinal system: Abdomen is non distended, soft and non tender.Normal bowel  sounds heard. Central nervous system: Alert and oriented x 3. No focal neurological deficits. Extremities: Status post ORIF POD #1. Skin: No rashes, lesions or ulcers Psychiatry: Judgement and insight appear normal. Mood & affect appropriate.     Data Reviewed: I have personally reviewed following labs and imaging studies  CBC: Recent Labs  Lab 12/25/23 1705 12/26/23 0329 12/27/23 0558  WBC 9.0 7.5 11.5*  NEUTROABS 7.3  --   --   HGB 13.9 12.7 9.9*  HCT 42.4 37.5 30.0*  MCV 103.9* 102.2* 103.4*  PLT 153 133* 127*   Basic Metabolic Panel: Recent Labs  Lab 12/25/23 1705 12/27/23 0558  NA 139 138  K 4.5 3.6  CL 102 102  CO2 25 25  GLUCOSE 92 121*  BUN 14 12  CREATININE 0.56 0.68  CALCIUM  9.3 8.2*   GFR: Estimated Creatinine Clearance: 55.3 mL/min (by C-G formula based on SCr of 0.68 mg/dL). Liver Function Tests: No results for input(s): AST, ALT, ALKPHOS, BILITOT, PROT, ALBUMIN in the last 168 hours. No results for input(s): LIPASE, AMYLASE in the last 168 hours. No results for input(s): AMMONIA in the last 168 hours. Coagulation Profile: No results for input(s): INR, PROTIME in the last 168 hours. Cardiac Enzymes: No results for input(s): CKTOTAL, CKMB, CKMBINDEX, TROPONINI in the last 168 hours. BNP (last 3 results) No results for input(s): PROBNP in the last 8760 hours. HbA1C: No results for input(s): HGBA1C in the last 72 hours. CBG: No results for input(s): GLUCAP in the last 168 hours. Lipid Profile: No results for input(s): CHOL, HDL, LDLCALC, TRIG, CHOLHDL, LDLDIRECT in the last 72 hours. Thyroid  Function Tests: No results for input(s): TSH, T4TOTAL, FREET4, T3FREE, THYROIDAB in the last 72 hours. Anemia Panel: No results for input(s): VITAMINB12, FOLATE, FERRITIN, TIBC, IRON, RETICCTPCT in the last 72 hours. Sepsis Labs: No results for input(s): PROCALCITON, LATICACIDVEN in the  last 168 hours.  No results found for this or any previous visit (from the past 240 hours).   Radiology Studies: DG FEMUR PORT MIN 2 VIEWS LEFT Result Date: 12/26/2023 EXAM: 2 VIEW(S) XRAY OF THE LEFT FEMUR 12/26/2023 03:08:00 PM COMPARISON: None available. CLINICAL HISTORY: [id number] Fracture [id number]. Fracture [id number] FINDINGS: BONES AND JOINTS: Left total knee arthroplasty in place. Lateral plate and screw fixation of left femur in place. Distal femoral periprosthetic fracture with near anatomic alignment. No joint dislocation. SOFT TISSUES: Expected soft tissue air noted. IMPRESSION: 1. Distal femoral periprosthetic fracture with near anatomic alignment. 2. Lateral plate and screw fixation of left femur in place. 3. Left total knee arthroplasty in place. Electronically signed by: Lonni Necessary Lyons 12/26/2023 07:30 PM EDT RP Workstation: [id number]   DG FEMUR MIN 2 VIEWS LEFT Result Date: 12/26/2023 EXAM: XR Left Distal Femur, 2 Views TECHNIQUE: Fluoroscopy was provided by the radiology department for procedure. Radiologist was not present during examination. FLUOROSCOPY DOSE AND TYPE: Radiation Dose Index: Reference Air Kerma (in mGy) = 3.84 Fluoroscopy time: 49 seconds.  COMPARISON: None available. CLINICAL HISTORY: Surgery, elective [id number]. ORIF Left Distal Femur; RSTO by TMS; LMP verified prior to exam, TMS; :49 sec fluoro; 3.84 mGy FINDINGS: 6 intraoperative images are submitted. Lateral plate and screw fixation was performed. The comminuted distal femur fracture is reduced. Minimal medial displacement remains. Hardware is intact. No new fractures are present. IMPRESSION: 1. Comminuted distal femur fracture reduced with minimal medial displacement. 2. Lateral plate and screw fixation performed with intact hardware. 3. No new fractures. NOTE: Intraoperative fluoroscopic spot images as above. Please refer to the intraoperative report for full details. Electronically signed by: Lonni Necessary Lyons  12/26/2023 07:29 PM EDT RP Workstation: [id number]   DG C-Arm 1-60 Min-No Report Result Date: 12/26/2023 Fluoroscopy was utilized by the requesting physician.  No radiographic interpretation.   DG Chest Portable 1 View Result Date: 12/25/2023 CLINICAL DATA:  preop EXAM: PORTABLE CHEST 1 VIEW COMPARISON:  Chest x-ray 04/16/2019 FINDINGS: The heart and mediastinal contours are within normal limits. Elevated left hemidiaphragm. No focal consolidation. No pulmonary edema. No pleural effusion. No pneumothorax. No acute osseous abnormality. IMPRESSION: No active disease. Electronically Signed   By: Morgane  Naveau M.D.   On: 12/25/2023 19:53   CT Head Wo Contrast Result Date: 12/25/2023 CLINICAL DATA:  Head trauma, minor, normal mental status (Age 81-64y). Trip and fall EXAM: CT HEAD WITHOUT CONTRAST TECHNIQUE: Contiguous axial images were obtained from the base of the skull through the vertex without intravenous contrast. RADIATION DOSE REDUCTION: This exam was performed according to the departmental dose-optimization program which includes automated exposure control, adjustment of the mA and/or kV according to patient size and/or use of iterative reconstruction technique. COMPARISON:  Ct max face 12/26/14, CT head 01/14/09 FINDINGS: Brain: Cerebral ventricle sizes are concordant with the degree of cerebral volume loss. Patchy and confluent areas of decreased attenuation are noted throughout the deep and periventricular white matter of the cerebral hemispheres bilaterally, compatible with chronic microvascular ischemic disease. Bilateral basal ganglia mineralization. No evidence of large-territorial acute infarction. No parenchymal hemorrhage. No mass lesion. No extra-axial collection. No mass effect or midline shift. No hydrocephalus. Basilar cisterns are patent. Vascular: No hyperdense vessel. Atherosclerotic calcifications are present within the cavernous internal carotid arteries. Skull: No acute fracture or  focal lesion. Sinuses/Orbits: Paranasal sinuses and mastoid air cells are clear. Bilateral lens replacement. Otherwise the orbits are unremarkable. Other: None. IMPRESSION: No acute intracranial abnormality. Electronically Signed   By: Morgane  Naveau M.D.   On: 12/25/2023 19:44   DG Knee Complete 4 Views Left Result Date: 12/25/2023 CLINICAL DATA:  Trip and fall injury with left knee swelling and pain. EXAM: LEFT KNEE - COMPLETE 4+ VIEW COMPARISON:  05/29/2021 FINDINGS: Postoperative changes with left total knee arthroplasty including patellar femoral component. There is an acute transverse periprosthetic fracture of the distal left femoral metaphysis with impaction and about 1.5 cm posterior displacement of the distal fracture fragment. Moderate left knee effusion. The proximal tibia and fibula appear intact. IMPRESSION: Left total knee arthroplasty. Acute periprosthetic fracture of the distal femoral metaphysis with posterior displacement of fracture fragments. Moderate effusion. Electronically Signed   By: Elsie Gravely M.D.   On: 12/25/2023 18:32   DG Hip Unilat W or Wo Pelvis 2-3 Views Left Result Date: 12/25/2023 CLINICAL DATA:  fall EXAM: DG HIP (WITH OR WITHOUT PELVIS) 2-3V LEFT COMPARISON:  None Available. FINDINGS: Osteopenia.No evidence of pelvic fracture or diastasis.No acute hip fracture or dislocation.Mild-to-moderate joint space loss of both hips. Degenerative disc disease in the lower  lumbar spine. Soft tissues are unremarkable. IMPRESSION: No acute fracture, pelvic bone diastasis, or dislocation. Electronically Signed   By: Rogelia Myers M.D.   On: 12/25/2023 18:29   Scheduled Meds:  cholecalciferol  4,000 Units Oral Daily   docusate sodium   100 mg Oral BID   enoxaparin  (LOVENOX ) injection  40 mg Subcutaneous Q24H   pantoprazole   40 mg Oral Daily   Continuous Infusions:  sodium chloride  40 mL/hr at 12/26/23 1609    ceFAZolin  (ANCEF ) IV 2 g (12/27/23 0605)     LOS: 2 days     Time spent: 35 mins    Darcel Dawley, Lyons [facility]   If 7PM-7AM, please contact night-coverage

## 2023-12-27 NOTE — Progress Notes (Signed)
 Inpatient Rehab Admissions Coordinator:   Per therapy recommendations, patient was screened for CIR candidacy by Leita Kleine, MS, CCC-SLP. At this time, Pt. does not appear to demonstrate medical necessity to justify in hospital rehabilitation/CIR.I do not think insurance would approve an AIR admit. I  will not pursue a rehab consult for this Pt.   Recommend other rehab venues to be pursued.  Please contact me with any questions.  Leita Kleine, MS, CCC-SLP Rehab Admissions Coordinator  [phone] (celll) [phone] (office)

## 2023-12-28 DIAGNOSIS — S72402A Unspecified fracture of lower end of left femur, initial encounter for closed fracture: Secondary | ICD-10-CM | POA: Diagnosis not present

## 2023-12-28 LAB — CBC
HCT: 27.1 % — ABNORMAL LOW (ref 36.0–46.0)
Hemoglobin: 8.9 g/dL — ABNORMAL LOW (ref 12.0–15.0)
MCH: 34.1 pg — ABNORMAL HIGH (ref 26.0–34.0)
MCHC: 32.8 g/dL (ref 30.0–36.0)
MCV: 103.8 fL — ABNORMAL HIGH (ref 80.0–100.0)
Platelets: 116 K/uL — ABNORMAL LOW (ref 150–400)
RBC: 2.61 MIL/uL — ABNORMAL LOW (ref 3.87–5.11)
RDW: 13.4 % (ref 11.5–15.5)
WBC: 9.1 K/uL (ref 4.0–10.5)
nRBC: 0 % (ref 0.0–0.2)

## 2023-12-28 MED ORDER — LORAZEPAM 1 MG PO TABS
1.0000 mg | ORAL_TABLET | Freq: Two times a day (BID) | ORAL | Status: DC | PRN
  Administered 2023-12-28 – 2023-12-30 (×3): 1 mg via ORAL
  Filled 2023-12-28 (×3): qty 1

## 2023-12-28 MED ORDER — ALUM & MAG HYDROXIDE-SIMETH 200-200-20 MG/5ML PO SUSP
30.0000 mL | ORAL | Status: DC | PRN
  Administered 2023-12-28 (×2): 30 mL via ORAL
  Filled 2023-12-28 (×2): qty 30

## 2023-12-28 NOTE — Progress Notes (Signed)
 PROGRESS NOTE    Christina Lyons  FMW:[id number] DOB: 12-15-1940 DOA: 12/25/2023 PCP: Duanne Butler DASEN, MD   Brief Narrative:  This 83 years old female with PMH significant for bilateral knee replacement, GERD, hypertension, hyperlipidemia, osteoporosis, right breast cancer, heart murmur presented status post mechanical fall, hit her head but denies any loss of consciousness, not on any anticoagulation.  She has developed pain in the left knee.  She has stopped taking her blood pressure and hyperlipidemia medication 4 months ago.  Imaging confirms closed fracture of left distal femur.  Patient admitted for further evaluation,  orthopedics consulted , status post ORIF POD #1.   Assessment & Plan:   Principal Problem:   Closed fracture of left distal femur (HCC) Active Problems:   Hyperlipidemia   Hypertension   Malignant neoplasm of upper-outer quadrant of right breast in female, estrogen receptor positive (HCC)   Closed fracture of left distal femur: Status post mechanical fall: Patient presented status post mechanical fall with left leg pain. X-ray confirms left distal femur fracture. Continue adequate pain control. Orthopedics is consulted. Status post ORIF POD #2. PT/OT  recommended Acute inpatient rehab, but declined ,  likely needs SNF. WBAT LLE, adequate pain control. Ortho signed off , can be discharged once SNF available. Follow-up 2 weeks in office for wound check and repeat x-ray.  Hyperlipidemia: Patient is not on any statins. Check lipid profile.  Hypertension: Patient has been off of blood pressure medications. BP controlled off medications.  Malignant neoplasm of the upper outer quadrant of right breast: Hold Femara  for tonight. Follow-up outpatient.  Urinary burning: Obtain UA to rule out urinary tract infection.  DVT prophylaxis: SCDs Code Status: Full code Family Communication: No family at bedside. Disposition Plan:    Status is:  Inpatient Remains inpatient appropriate because: Admitted status post mechanical fall with closed fracture of left distal femur. Status post ORIF POD # 2.  PT/ OT pending ,  TOC notified for possible SNF placement   Consultants:  Orthopedics  Procedures:  S/P ORIF POD # 2 Antimicrobials:  Anti-infectives (From admission, onward)    Start     Dose/Rate Route Frequency Ordered Stop   12/26/23 1900  ceFAZolin  (ANCEF ) IVPB 2g/100 mL premix        2 g 200 mL/hr over 30 Minutes Intravenous Every 8 hours 12/26/23 1553 12/28/23 0755   12/26/23 1304  vancomycin  (VANCOCIN ) powder  Status:  Discontinued          As needed 12/26/23 1304 12/26/23 1346   12/26/23 1029  ceFAZolin  (ANCEF ) 2-4 GM/100ML-% IVPB  Status:  Discontinued       Note to Pharmacy: [facility] [place], [place]: cabinet override      12/26/23 1029 12/26/23 1056   12/26/23 1015  ceFAZolin  (ANCEF ) IVPB 2g/100 mL premix        2 g 200 mL/hr over 30 Minutes Intravenous On call to O.R. 12/26/23 0927 12/26/23 1243      Subjective: Patient was seen and examined at bedside.Overnight events noted. Patient reports pain is reasonably controlled, She has participated with physical therapy yesterday. CIR was recommended. She is status post ORIF POD #2.  She reports urinary burning.  Objective: Vitals:   12/27/23 1429 12/27/23 1945 12/28/23 0339 12/28/23 0729  BP: 116/74 129/62 123/77 (!) 140/76  Pulse: 96 (!) 106 97 94  Resp: 16   17  Temp: 97.9 F (36.6 C) 99.7 F (37.6 C) 98.1 F (36.7 C) 98.4 F (36.9 C)  TempSrc: Oral  Oral Oral Oral  SpO2: 92% 97% 92% 96%  Weight:      Height:        Intake/Output Summary (Last 24 hours) at 12/28/2023 1029 Last data filed at 12/28/2023 0300 Gross per 24 hour  Intake 1417.93 ml  Output 350 ml  Net 1067.93 ml   Filed Weights   12/26/23 1052  Weight: 88 kg    Examination:  General exam: Appears calm and comfortable, not in any acute distress.  Deconditioned. Respiratory system: CTA  Bilaterally.  Respiratory effort normal.  RR 15 Cardiovascular system: S1 & S2 heard, RRR. No JVD, murmurs, rubs, gallops or clicks.  Gastrointestinal system: Abdomen is non distended, soft and non tender.Normal bowel sounds heard. Central nervous system: Alert and oriented x 3. No focal neurological deficits. Extremities: Status post ORIF POD # 2. Skin: No rashes, lesions or ulcers Psychiatry: Judgement and insight appear normal. Mood & affect appropriate.     Data Reviewed: I have personally reviewed following labs and imaging studies  CBC: Recent Labs  Lab 12/25/23 1705 12/26/23 0329 12/27/23 0558 12/28/23 0539  WBC 9.0 7.5 11.5* 9.1  NEUTROABS 7.3  --   --   --   HGB 13.9 12.7 9.9* 8.9*  HCT 42.4 37.5 30.0* 27.1*  MCV 103.9* 102.2* 103.4* 103.8*  PLT 153 133* 127* 116*   Basic Metabolic Panel: Recent Labs  Lab 12/25/23 1705 12/27/23 0558  NA 139 138  K 4.5 3.6  CL 102 102  CO2 25 25  GLUCOSE 92 121*  BUN 14 12  CREATININE 0.56 0.68  CALCIUM  9.3 8.2*   GFR: Estimated Creatinine Clearance: 55.3 mL/min (by C-G formula based on SCr of 0.68 mg/dL). Liver Function Tests: No results for input(s): AST, ALT, ALKPHOS, BILITOT, PROT, ALBUMIN in the last 168 hours. No results for input(s): LIPASE, AMYLASE in the last 168 hours. No results for input(s): AMMONIA in the last 168 hours. Coagulation Profile: No results for input(s): INR, PROTIME in the last 168 hours. Cardiac Enzymes: No results for input(s): CKTOTAL, CKMB, CKMBINDEX, TROPONINI in the last 168 hours. BNP (last 3 results) No results for input(s): PROBNP in the last 8760 hours. HbA1C: No results for input(s): HGBA1C in the last 72 hours. CBG: No results for input(s): GLUCAP in the last 168 hours. Lipid Profile: No results for input(s): CHOL, HDL, LDLCALC, TRIG, CHOLHDL, LDLDIRECT in the last 72 hours. Thyroid  Function Tests: No results for input(s): TSH,  T4TOTAL, FREET4, T3FREE, THYROIDAB in the last 72 hours. Anemia Panel: No results for input(s): VITAMINB12, FOLATE, FERRITIN, TIBC, IRON, RETICCTPCT in the last 72 hours. Sepsis Labs: No results for input(s): PROCALCITON, LATICACIDVEN in the last 168 hours.  No results found for this or any previous visit (from the past 240 hours).   Radiology Studies: DG FEMUR PORT MIN 2 VIEWS LEFT Result Date: 12/26/2023 EXAM: 2 VIEW(S) XRAY OF THE LEFT FEMUR 12/26/2023 03:08:00 PM COMPARISON: None available. CLINICAL HISTORY: [id number] Fracture [id number]. Fracture [id number] FINDINGS: BONES AND JOINTS: Left total knee arthroplasty in place. Lateral plate and screw fixation of left femur in place. Distal femoral periprosthetic fracture with near anatomic alignment. No joint dislocation. SOFT TISSUES: Expected soft tissue air noted. IMPRESSION: 1. Distal femoral periprosthetic fracture with near anatomic alignment. 2. Lateral plate and screw fixation of left femur in place. 3. Left total knee arthroplasty in place. Electronically signed by: Lonni Necessary MD 12/26/2023 07:30 PM EDT RP Workstation: [id number]   DG FEMUR MIN 2 VIEWS LEFT Result Date: 12/26/2023  EXAM: XR Left Distal Femur, 2 Views TECHNIQUE: Fluoroscopy was provided by the radiology department for procedure. Radiologist was not present during examination. FLUOROSCOPY DOSE AND TYPE: Radiation Dose Index: Reference Air Kerma (in mGy) = 3.84 Fluoroscopy time: 49 seconds. COMPARISON: None available. CLINICAL HISTORY: Surgery, elective [id number]. ORIF Left Distal Femur; RSTO by TMS; LMP verified prior to exam, TMS; :49 sec fluoro; 3.84 mGy FINDINGS: 6 intraoperative images are submitted. Lateral plate and screw fixation was performed. The comminuted distal femur fracture is reduced. Minimal medial displacement remains. Hardware is intact. No new fractures are present. IMPRESSION: 1. Comminuted distal femur fracture reduced with minimal medial  displacement. 2. Lateral plate and screw fixation performed with intact hardware. 3. No new fractures. NOTE: Intraoperative fluoroscopic spot images as above. Please refer to the intraoperative report for full details. Electronically signed by: Lonni Necessary MD 12/26/2023 07:29 PM EDT RP Workstation: [id number]   DG C-Arm 1-60 Min-No Report Result Date: 12/26/2023 Fluoroscopy was utilized by the requesting physician.  No radiographic interpretation.   Scheduled Meds:  cholecalciferol  4,000 Units Oral Daily   docusate sodium   100 mg Oral BID   enoxaparin  (LOVENOX ) injection  40 mg Subcutaneous Q24H   pantoprazole   40 mg Oral Daily   Continuous Infusions:     LOS: 3 days    Time spent: 35 mins    Darcel Dawley, MD [facility]   If 7PM-7AM, please contact night-coverage

## 2023-12-28 NOTE — Progress Notes (Signed)
 Patient's family would like to request [facility] in [place] for SNF placement

## 2023-12-28 NOTE — Plan of Care (Signed)

## 2023-12-28 NOTE — Progress Notes (Signed)
 Orthopaedic Trauma Progress Note  SUBJECTIVE: Doing ok this morning, but feels a bit anxious.  Notes she takes lorazepam  1 mg twice daily as needed at home.  Asking if this can be restarted while here in the hospital.  Had a difficult time sleeping last night, thinks she slept for about 2 hours.  Pain in the leg fairly well controlled.  Was able to mobilize to bedside chair yesterday and sit up for few hours.  Denies any numbness or tingling throughout operative extremity.  No chest pain. No SOB. No nausea/vomiting.  Patient has noted some burning and increased frequency with urination.  UA ordered today.  Patient was tolerating diet and fluids.  No family at bedside currently.  OBJECTIVE:  Vitals:   12/28/23 0339 12/28/23 0729  BP: 123/77 (!) 140/76  Pulse: 97 94  Resp:  17  Temp: 98.1 F (36.7 C) 98.4 F (36.9 C)  SpO2: 92% 96%    Opiates Today (MME): Today's  total administered Morphine  Milligram Equivalents: 0 Opiates Yesterday (MME): Yesterday's total administered Morphine  Milligram Equivalents: 30  General: Sitting up in bedside chair, no acute distress.  Pleasant and cooperative Respiratory: No increased work of breathing.  Operative Extremity (left lower extremity): Ace wrap dressing and Mepilex removed, small amount of bloody/serosanguineous drainage from anterior lateral knee incision.  Proximal incision stable.  New Mepilex dressings applied.  Some soreness with palpation through the mid to distal thigh as expected.  No significant calf tenderness.  Nontender over the hip.  Ankle DF/PF intact.  Compartments are soft compressible.  Toes warm and well-perfused. + DP pulse  IMAGING: Stable post op imaging left femur  LABS:  Results for orders placed or performed during the hospital encounter of 12/25/23 (from the past 24 hours)  CBC     Status: Abnormal   Collection Time: 12/28/23  5:39 AM  Result Value Ref Range   WBC 9.1 4.0 - 10.5 K/uL   RBC 2.61 (L) 3.87 - 5.11 MIL/uL    Hemoglobin 8.9 (L) 12.0 - 15.0 g/dL   HCT 72.8 (L) 63.9 - 53.9 %   MCV 103.8 (H) 80.0 - 100.0 fL   MCH 34.1 (H) 26.0 - 34.0 pg   MCHC 32.8 30.0 - 36.0 g/dL   RDW 86.5 88.4 - 84.4 %   Platelets 116 (L) 150 - 400 K/uL   nRBC 0.0 0.0 - 0.2 %    ASSESSMENT: Christina Lyons is a 83 y.o. female, 2 Days Post-Op s/p fall at home Procedures: OPEN REDUCTION INTERNAL FIXATION LEFT DISTAL FEMUR FRACTURE  CV/Blood loss: Acute blood loss anemia, Hgb 8.9 this AM. Hemodynamically stable  PLAN: Weightbearing: WBAT LLE ROM: Unrestricted ROM Incisional and dressing care: Change Mepilex dressings as needed Showering: Okay begin getting incisions wet starting 12/30/2023 if no drainage from incisions Orthopedic device(s): None  Pain management:  1. Tylenol  325-650 mg q 6 hours scheduled 2. Robaxin  500 mg q 6 hours PRN 3. Norco 5-325 or 7.5-325 mg q 4 hours PRN 4. Morphine  0.5 mg q 3 hours PRN VTE prophylaxis: Lovenox , SCDs ID:  Ancef  2gm post op completed Foley/Lines:  No foley, KVO IVFs Impediments to Fracture Healing: Vitamin D  level 28, continue on home dose vitamin D  supplementation 4000 units daily  Dispo: PT/OT evaluation ongoing, initially recommended CIR but admission coordinator does not feel patient demonstrates medical necessity.  Recommending pursuit of other rehab venues.  Ortho issues stable.  Okay for discharge from ortho standpoint once cleared by medicine team and  therapies.  Will hold off on sending discharge Rx until discharge location confirmed. D/C recommendations: - Norco, Robaxin , Tylenol  as needed for pain control - Eliquis 2.5 mg twice daily x 30 days for DVT prophylaxis - Continue home dose Vit D supplementation  Follow - up plan: 2 weeks after d/c for wound check and repeat x-rays   Contact information:  Franky Light MD, Lauraine Moores PA-C. After hours and holidays please check [email or web address] for group call information for [facility]   Lauraine PATRIC Moores, PA-C [phone] (office) [email or web address]

## 2023-12-28 NOTE — Evaluation (Signed)
 Occupational Therapy Evaluation Patient Details Name: Christina Lyons MRN: [id number] DOB: 07/09/1940 Today's Date: 12/28/2023   History of Present Illness   Christina Lyons is a 83 y.o. female admitted 12/24/24 after ground-level fall, sustaining left periprosthetic supracondylar distal femur fracture. Pt s/p ORIF 9/26. PMHx: DDD, GERD, HLD, HTN, osteoporosis, idiopathic thoracolumbar scoliosis, right breast cancer, heart murmur, and B TKA.     Clinical Impressions Pt currently at max assist level for simulated selfcare tasks sit to stand and for transfers attempted stand pivot with use of the RW.  She was unable to adequately take steps in standing for transfer at this time secondary to LLE weakness and increased pain.  Prior to admission she was living with her son and was independent to modified independent for ADLs and mobility.  She will now benefit from acute care OT at this time in order to increase independence and decreased caregiver burden.  Feel she will need post acute continued inpatient follow up therapy, <3 hours/day to progress to a safe level for return home.        If plan is discharge home, recommend the following:   A lot of help with walking and/or transfers;A lot of help with bathing/dressing/bathroom;Assist for transportation;Assistance with cooking/housework     Functional Status Assessment   Patient has had a recent decline in their functional status and demonstrates the ability to make significant improvements in function in a reasonable and predictable amount of time.     Equipment Recommendations   Other (comment) (TBD next venue of care)      Precautions/Restrictions   Precautions Precautions: Fall Recall of Precautions/Restrictions: Intact Restrictions Weight Bearing Restrictions Per Provider Order: No LLE Weight Bearing Per Provider Order: Weight bearing as tolerated     Mobility Bed Mobility Overal bed mobility: Needs Assistance Bed  Mobility: Supine to Sit, Rolling Rolling: Mod assist   Supine to sit: Mod assist     General bed mobility comments: Pt needed assist with bringing LLE over to the edge of the bed and then scooting hips out.  HOB elevated with use of hand rail during this session.    Transfers Overall transfer level: Needs assistance Equipment used: Rolling walker (2 wheels) Transfers: Sit to/from Stand, Bed to chair/wheelchair/BSC Sit to Stand: From elevated surface, Mod assist     Step pivot transfers: Max assist     General transfer comment: Pt able to complete sit to stand with mod assist and increased time from elevated EOB but needed max assist to take steps toward the chair.  She was only able to take 2 then had to rely on heel to toe method with use of the RW to complete.      Balance Overall balance assessment: Needs assistance Sitting-balance support: Bilateral upper extremity supported, Feet supported Sitting balance-Leahy Scale: Fair Sitting balance - Comments: Pt sat EOB with close supervision   Standing balance support: Bilateral upper extremity supported, During functional activity, Reliant on assistive device for balance Standing balance-Leahy Scale: Poor Standing balance comment: Max assist needed for transfers using the RW for BUE support.                           ADL either performed or assessed with clinical judgement   ADL Overall ADL's : Needs assistance/impaired Eating/Feeding: Independent;Sitting   Grooming: Wash/dry hands;Wash/dry face;Set up;Sitting   Upper Body Bathing: Supervision/ safety;Sitting   Lower Body Bathing: Maximal assistance;Sit to/from stand  Upper Body Dressing : Minimal assistance;Sitting   Lower Body Dressing: Maximal assistance;Sit to/from stand   Toilet Transfer: Maximal assistance;Stand-pivot;Rolling walker (2 wheels);BSC/3in1 Toilet Transfer Details (indicate cue type and reason): simulated Toileting- Clothing Manipulation  and Hygiene: Maximal assistance;Sit to/from stand       Functional mobility during ADLs: Maximal assistance (stand pivot with RW) General ADL Comments: Pt unable to unweight the non-involved RLE for taking steps secondary to weakness and pain.  Started transfer with 1-2 small steps and then transitioned to the heel to toe method on the right foot.  Increased stress incontinence noted with all movements.  Pt also with decreased ability to reach down to her feet for dressing tasks.  Will need AE trial for greater independence.     Vision Baseline Vision/History: 1 Wears glasses Ability to See in Adequate Light: 0 Adequate Patient Visual Report: No change from baseline Vision Assessment?: No apparent visual deficits     Perception Perception: Within Functional Limits       Praxis Praxis: WFL       Pertinent Vitals/Pain Pain Assessment Pain Assessment: Faces Faces Pain Scale: Hurts even more Pain Location: L hip Pain Descriptors / Indicators: Discomfort, Aching, Sore Pain Intervention(s): Limited activity within patient's tolerance, Repositioned, Monitored during session     Extremity/Trunk Assessment Upper Extremity Assessment Upper Extremity Assessment: Generalized weakness (bilateral shoulder flexion 0-100 degrees secondary to thoracic rounding.  All other joints AROM WFLs.  Strength 3+/5 in shoulders and 4/5 elbow flexion/ext and grip strength)   Lower Extremity Assessment Lower Extremity Assessment: Defer to PT evaluation   Cervical / Trunk Assessment Cervical / Trunk Exceptions: Thoracic Rounding   Communication Communication Communication: No apparent difficulties   Cognition Arousal: Alert Behavior During Therapy: WFL for tasks assessed/performed                                 Following commands: Intact       Cueing  General Comments   Cueing Techniques: Verbal cues;Tactile cues                 OT Problem List: Decreased  strength;Decreased knowledge of use of DME or AE;Decreased activity tolerance;Impaired balance (sitting and/or standing);Pain   OT Treatment/Interventions: Self-care/ADL training;Patient/family education;Balance training;Therapeutic activities;DME and/or AE instruction      OT Goals(Current goals can be found in the care plan section)   Acute Rehab OT Goals Patient Stated Goal: Pt wants to get moving better OT Goal Formulation: With patient Time For Goal Achievement: 01/12/24 Potential to Achieve Goals: Fair   OT Frequency:  Min 1X/week       AM-PAC OT 6 Clicks Daily Activity     Outcome Measure Help from another person eating meals?: None Help from another person taking care of personal grooming?: A Little Help from another person toileting, which includes using toliet, bedpan, or urinal?: Total Help from another person bathing (including washing, rinsing, drying)?: Total Help from another person to put on and taking off regular upper body clothing?: A Little Help from another person to put on and taking off regular lower body clothing?: Total 6 Click Score: 13   End of Session Equipment Utilized During Treatment: Gait belt;Rolling walker (2 wheels) Nurse Communication: Need for lift equipment  Activity Tolerance: Patient limited by pain Patient left: in chair;with call bell/phone within reach;with chair alarm set  OT Visit Diagnosis: Unsteadiness on feet (R26.81);Other abnormalities of gait and mobility (  R26.89);Muscle weakness (generalized) (M62.81);Repeated falls (R29.6);Pain Pain - Right/Left: Left Pain - part of body: Leg                Time: 9084-9045 OT Time Calculation (min): 39 min Charges:  OT General Charges $OT Visit: 1 Visit OT Treatments $Self Care/Home Management : 23-37 mins  Lynwood Constant, OTR/L Acute Rehabilitation Services  Office [phone] 12/28/2023

## 2023-12-29 ENCOUNTER — Encounter (HOSPITAL_COMMUNITY): Payer: Self-pay | Admitting: Student

## 2023-12-29 LAB — CBC
HCT: 24.8 % — ABNORMAL LOW (ref 36.0–46.0)
Hemoglobin: 8.3 g/dL — ABNORMAL LOW (ref 12.0–15.0)
MCH: 34.6 pg — ABNORMAL HIGH (ref 26.0–34.0)
MCHC: 33.5 g/dL (ref 30.0–36.0)
MCV: 103.3 fL — ABNORMAL HIGH (ref 80.0–100.0)
Platelets: 133 K/uL — ABNORMAL LOW (ref 150–400)
RBC: 2.4 MIL/uL — ABNORMAL LOW (ref 3.87–5.11)
RDW: 13.5 % (ref 11.5–15.5)
WBC: 8.6 K/uL (ref 4.0–10.5)
nRBC: 0 % (ref 0.0–0.2)

## 2023-12-29 LAB — LIPID PANEL
Cholesterol: 168 mg/dL (ref 0–200)
HDL: 52 mg/dL (ref >40)
LDL Cholesterol: 99 mg/dL (ref 0–99)
Total CHOL/HDL Ratio: 3.2 ratio
Triglycerides: 83 mg/dL (ref <150)
VLDL: 17 mg/dL (ref 0–40)

## 2023-12-29 MED ORDER — ACETAMINOPHEN 325 MG PO TABS: 325.0000 mg | ORAL_TABLET | Freq: Four times a day (QID) | ORAL | Status: AC | PRN

## 2023-12-29 MED ORDER — METHOCARBAMOL 500 MG PO TABS: 500.0000 mg | ORAL_TABLET | Freq: Four times a day (QID) | ORAL | 0 refills | Status: DC | PRN

## 2023-12-29 MED ORDER — HYDROCODONE-ACETAMINOPHEN 5-325 MG PO TABS: 1.0000 | ORAL_TABLET | ORAL | 0 refills | Status: DC | PRN

## 2023-12-29 MED ORDER — APIXABAN 2.5 MG PO TABS: 2.5000 mg | ORAL_TABLET | Freq: Two times a day (BID) | ORAL | 0 refills | Status: DC

## 2023-12-29 NOTE — Care Management Important Message (Signed)
 Important Message  Patient Details  Name: Christina Lyons MRN: [id number] Date of Birth: November 03, 1940   Important Message Given:  Yes - Medicare IM     Jon Cruel 12/29/2023, 12:56 PM

## 2023-12-29 NOTE — Progress Notes (Signed)
 Physical Therapy Treatment Patient Details Name: Christina Lyons MRN: [id number] DOB: [birth year] Today's Date: 12/29/2023   History of Present Illness Christina Lyons is a 83 y.o. female admitted 12/24/24 after ground-level fall, sustaining left periprosthetic supracondylar distal femur fracture. Pt s/p ORIF 9/26. PMHx: DDD, GERD, HLD, HTN, osteoporosis, idiopathic thoracolumbar scoliosis, right breast cancer, heart murmur, and B TKA.    PT Comments  Pt resting in bed on arrival, pleasant and agreeable to session. Pt continues to be limited by LLE pain, limiting weight bearing and standing tolerance and gait progression. Pt able to come to sitting EOB with mod A and boost to stand from elevated EOB with mod A. Pt unable to progress gait this session due to pain and weakness, however able to pivot on R with max A and RW for support EOB>chair with assist to maintain balance and manage RW. Pt up in chair at end of session. Pt continues to benefit from skilled PT services to progress toward functional mobility goals.      If plan is discharge home, recommend the following: Two people to help with walking and/or transfers;A lot of help with bathing/dressing/bathroom;Assistance with cooking/housework;Assist for transportation;Help with stairs or ramp for entrance   Can travel by private vehicle        Equipment Recommendations  Wheelchair (measurements PT);Wheelchair cushion (measurements PT);Other (comment);BSC/3in1 (drop arm)    Recommendations for Other Services       Precautions / Restrictions Precautions Precautions: Fall Recall of Precautions/Restrictions: Intact Restrictions Weight Bearing Restrictions Per Provider Order: No LLE Weight Bearing Per Provider Order: Weight bearing as tolerated     Mobility  Bed Mobility Overal bed mobility: Needs Assistance Bed Mobility: Supine to Sit     Supine to sit: Mod assist     General bed mobility comments: Pt needed assist with bringing  LLE over to the edge of the bed and then scooting hips out.  HOB elevated with use of hand rail during this session.    Transfers Overall transfer level: Needs assistance Equipment used: Rolling walker (2 wheels) Transfers: Sit to/from Stand, Bed to chair/wheelchair/BSC Sit to Stand: From elevated surface, Mod assist Stand pivot transfers: Max assist         General transfer comment: Pt able to complete sit to stand with mod assist and increased time from elevated EOB but needed max assist to pivot on RLE as pt unable to advace LLE or accept weight to take steps    Ambulation/Gait               General Gait Details: Unable   Stairs             Wheelchair Mobility     Tilt Bed    Modified Rankin (Stroke Patients Only)       Balance Overall balance assessment: Needs assistance Sitting-balance support: Bilateral upper extremity supported, Feet supported Sitting balance-Leahy Scale: Fair Sitting balance - Comments: Pt sat EOB with close supervision   Standing balance support: Bilateral upper extremity supported, During functional activity, Reliant on assistive device for balance Standing balance-Leahy Scale: Poor Standing balance comment: Max assist needed for transfers using the RW for BUE support.                            Communication Communication Communication: No apparent difficulties  Cognition Arousal: Alert Behavior During Therapy: WFL for tasks assessed/performed   PT - Cognitive impairments: No apparent impairments  Following commands: Intact      Cueing Cueing Techniques: Verbal cues, Tactile cues  Exercises      General Comments General comments (skin integrity, edema, etc.): Pt is experiencing urinary incontinence. Assisted with pericare.      Pertinent Vitals/Pain Pain Assessment Pain Assessment: Faces Faces Pain Scale: Hurts little more Pain Location: L hip Pain Descriptors /  Indicators: Discomfort, Aching, Sore Pain Intervention(s): Monitored during session, Limited activity within patient's tolerance, Repositioned    Home Living                          Prior Function            PT Goals (current goals can now be found in the care plan section) Acute Rehab PT Goals Patient Stated Goal: Regain independence and return home PT Goal Formulation: With patient Time For Goal Achievement: 01/10/24 Progress towards PT goals: Progressing toward goals    Frequency    Min 3X/week      PT Plan      Co-evaluation              AM-PAC PT 6 Clicks Mobility   Outcome Measure  Help needed turning from your back to your side while in a flat bed without using bedrails?: A Little Help needed moving from lying on your back to sitting on the side of a flat bed without using bedrails?: A Little Help needed moving to and from a bed to a chair (including a wheelchair)?: A Lot Help needed standing up from a chair using your arms (e.g., wheelchair or bedside chair)?: A Lot Help needed to walk in hospital room?: Total Help needed climbing 3-5 steps with a railing? : Total 6 Click Score: 12    End of Session Equipment Utilized During Treatment: Gait belt Activity Tolerance: Patient tolerated treatment well Patient left: in chair;with call bell/phone within reach;with chair alarm set Nurse Communication: Mobility status PT Visit Diagnosis: Difficulty in walking, not elsewhere classified (R26.2);Other abnormalities of gait and mobility (R26.89);Unsteadiness on feet (R26.81)     Time: 1004-1030 PT Time Calculation (min) (ACUTE ONLY): 26 min  Charges:    $Therapeutic Activity: 23-37 mins PT General Charges $$ ACUTE PT VISIT: 1 Visit                     Darchelle Nunes R. PTA Acute Rehabilitation Services Office: [phone]   Therisa CHRISTELLA Boor 12/29/2023, 2:24 PM

## 2023-12-29 NOTE — NC FL2 (Signed)
 [place]  MEDICAID FL2 LEVEL OF CARE FORM     IDENTIFICATION  Patient Name: Christina Lyons Birthdate: 27-Nov-1940 Sex: female Admission Date (Current Location): 12/25/2023  [facility] and [place] Number:  Producer, television/film/video and Address:  [facility], [address] [place], [place] [address]      Provider Number: [id number]  Attending Physician Name and Address:  Leotis Bogus, MD  Relative Name and Phone Number:  Luise [name] Blades   [phone]    Current Level of Care: Hospital Recommended Level of Care: Skilled Nursing Facility Prior Approval Number:    Date Approved/Denied:   PASRR Number: [id number]  Discharge Plan: SNF    Current Diagnoses: Patient Active Problem List   Diagnosis Date Noted   Closed fracture of left distal femur (HCC) 12/25/2023   Bacterial sinusitis 03/12/2023   Screening for malignant neoplasm of colon 11/08/2022   Gastroesophageal reflux disease 11/08/2022   Periumbilical pain 11/08/2022   Rectal bleeding 11/08/2022   Chronic venous insufficiency 02/19/2022   Primary osteoarthritis of left knee 05/29/2021   Body mass index 40.0-44.9, adult (HCC) 05/29/2021   Morbid obesity (HCC) 05/29/2021   Acute exacerbation of chronic bronchitis (HCC) 04/19/2021   Osteoporosis 10/04/2020   DDD (degenerative disc disease), lumbar 10/04/2020   Genetic testing 08/30/2020   Family history of breast cancer 08/16/2020   Malignant neoplasm of upper-outer quadrant of right breast in female, estrogen receptor positive (HCC) 08/15/2020   COVID-19 04/16/2019   Cough 10/06/2015   S/P total knee arthroplasty 01/07/2013   Lymphadenopathy 12/14/2012   Sinusitis, acute 12/14/2012   Hyperlipidemia    Hypertension    Other idiopathic scoliosis, thoracolumbar region     Orientation RESPIRATION BLADDER Height & Weight     Self, Time, Situation, Place  Normal Incontinent Weight: 194 lb (88 kg) Height:  5' (152.4 cm)  BEHAVIORAL  SYMPTOMS/MOOD NEUROLOGICAL BOWEL NUTRITION STATUS        Diet (see discharge summary)  AMBULATORY STATUS COMMUNICATION OF NEEDS Skin   Total Care Verbally Surgical wounds                       Personal Care Assistance Level of Assistance  Bathing, Feeding, Dressing Bathing Assistance: Maximum assistance Feeding assistance: Independent Dressing Assistance: Maximum assistance     Functional Limitations Info  Sight, Hearing, Speech Sight Info: Adequate Hearing Info: Adequate Speech Info: Adequate    SPECIAL CARE FACTORS FREQUENCY  PT (By licensed PT), OT (By licensed OT)     PT Frequency: 5x week OT Frequency: 5x week            Contractures Contractures Info: Not present    Additional Factors Info  Code Status, Allergies Code Status Info: full Allergies Info: sulfa antibiotics           Current Medications (12/29/2023):  This is the current hospital active medication list Current Facility-Administered Medications  Medication Dose Route Frequency Provider Last Rate Last Admin   acetaminophen  (TYLENOL ) tablet 325-650 mg  325-650 mg Oral Q6H PRN Danton, Sarah A, PA-C       alum & mag hydroxide-simeth (MAALOX/MYLANTA) 200-200-20 MG/5ML suspension 30 mL  30 mL Oral Q4H PRN Leotis Bogus, MD   30 mL at 12/28/23 0836   cholecalciferol (VITAMIN D3) tablet 4,000 Units  4,000 Units Oral Daily Danton Lauraine LABOR, PA-C   4,000 Units at 12/29/23 0900   docusate sodium  (COLACE) capsule 100 mg  100 mg Oral BID McClung,  Lauraine LABOR, PA-C   100 mg at 12/29/23 9097   enoxaparin  (LOVENOX ) injection 40 mg  40 mg Subcutaneous Q24H Danton Lauraine LABOR, PA-C   40 mg at 12/29/23 0901   HYDROcodone -acetaminophen  (NORCO) 7.5-325 MG per tablet 1-2 tablet  1-2 tablet Oral Q4H PRN Danton Lauraine LABOR, PA-C       HYDROcodone -acetaminophen  (NORCO/VICODIN) 5-325 MG per tablet 1-2 tablet  1-2 tablet Oral Q4H PRN Danton Lauraine LABOR, PA-C   1 tablet at 12/28/23 2102   LORazepam  (ATIVAN ) tablet 1 mg  1 mg Oral  BID PRN Danton Lauraine LABOR, PA-C   1 mg at 12/28/23 2106   methocarbamol  (ROBAXIN ) tablet 500 mg  500 mg Oral Q6H PRN Danton Lauraine LABOR, PA-C       Or   methocarbamol  (ROBAXIN ) injection 500 mg  500 mg Intravenous Q6H PRN Danton Lauraine LABOR, PA-C       metoCLOPramide  (REGLAN ) tablet 5-10 mg  5-10 mg Oral Q8H PRN Danton, Sarah A, PA-C       Or   metoCLOPramide  (REGLAN ) injection 5-10 mg  5-10 mg Intravenous Q8H PRN Danton Lauraine LABOR, PA-C       morphine  (PF) 2 MG/ML injection 0.5 mg  0.5 mg Intravenous Q3H PRN Danton Lauraine LABOR, PA-C   0.5 mg at 12/26/23 9182   ondansetron  (ZOFRAN ) tablet 4 mg  4 mg Oral Q6H PRN Danton Lauraine LABOR, PA-C       Or   ondansetron  (ZOFRAN ) injection 4 mg  4 mg Intravenous Q6H PRN Danton Lauraine LABOR, PA-C       pantoprazole  (PROTONIX ) EC tablet 40 mg  40 mg Oral Daily Danton Lauraine A, PA-C   40 mg at 12/29/23 0900   polyethylene glycol (MIRALAX  / GLYCOLAX ) packet 17 g  17 g Oral Daily PRN Danton Lauraine LABOR, PA-C         Discharge Medications: Please see discharge summary for a list of discharge medications.  Relevant Imaging Results:  Relevant Lab Results:   Additional Information SSN: [id number]  Bridget Cordella Simmonds, LCSW

## 2023-12-29 NOTE — Plan of Care (Signed)
  Problem: Pain Managment: Goal: General experience of comfort will improve and/or be controlled Outcome: Progressing   Problem: Safety: Goal: Ability to remain free from injury will improve Outcome: Progressing

## 2023-12-29 NOTE — TOC Initial Note (Addendum)
 Transition of Care [facility]) - Initial/Assessment Note    Patient Details  Name: Christina Lyons MRN: [id number] Date of Birth: 11-28-1940  Transition of [facility] CM/SW Contact:    Bridget Cordella Simmonds, LCSW Phone Number: 12/29/2023, 12:14 PM  Clinical Narrative:  [facility] unable to accept pt, CSW spoke with pt regarding SNF and pt is agreeable, requests [facility] or [facility].  Pt from home with son [name], no current services.  Permission given to speak with son and with niece [name].  Referral sent out in hub for SNF, CSW reached out to [facility] and [facility]: both are out of network with [employer].           1535: Bed offers provided to pt on medicare choice document.  Her son is coming to the hospital shortly, they will review.        Expected Discharge Plan: Skilled Nursing Facility Barriers to Discharge: SNF Pending bed offer   Patient Goals and CMS Choice Patient states their goals for this hospitalization and ongoing recovery are:: do what I want to do   Choice offered to / list presented to : Patient (pt requesting [facility] or [facility])      Expected Discharge Plan and Services In-house Referral: Clinical Social Work   Post Acute Care Choice: Skilled Nursing Facility Living arrangements for the past 2 months: Single Family Home                                      Prior Living Arrangements/Services Living arrangements for the past 2 months: Single Family Home Lives with:: Adult Children (with son [name]) Patient language and need for interpreter reviewed:: Yes Do you feel safe going back to the place where you live?: Yes      Need for Family Participation in Patient Care: Yes (Comment) Care giver support system in place?: Yes (comment) Current home services: Other (comment) (none) Criminal Activity/Legal Involvement Pertinent to Current Situation/Hospitalization: No - Comment as needed  Activities of Daily Living      Permission  Sought/Granted Permission sought to share information with : Family Supports Permission granted to share information with : Yes, Verbal Permission Granted  Share Information with NAME: son [name], niece [name]  Permission granted to share info w AGENCY: SNF        Emotional Assessment Appearance:: Appears stated age Attitude/Demeanor/Rapport: Engaged Affect (typically observed): Apprehensive, Pleasant Orientation: : Oriented to Self, Oriented to Place, Oriented to  Time, Oriented to Situation      Admission diagnosis:  Closed fracture of left distal femur (HCC) [S72.402A] Closed fracture of distal end of left femur, unspecified fracture morphology, initial encounter (HCC) [S72.402A] Patient Active Problem List   Diagnosis Date Noted   Closed fracture of left distal femur (HCC) 12/25/2023   Bacterial sinusitis 03/12/2023   Screening for malignant neoplasm of colon 11/08/2022   Gastroesophageal reflux disease 11/08/2022   Periumbilical pain 11/08/2022   Rectal bleeding 11/08/2022   Chronic venous insufficiency 02/19/2022   Primary osteoarthritis of left knee 05/29/2021   Body mass index 40.0-44.9, adult (HCC) 05/29/2021   Morbid obesity (HCC) 05/29/2021   Acute exacerbation of chronic bronchitis (HCC) 04/19/2021   Osteoporosis 10/04/2020   DDD (degenerative disc disease), lumbar 10/04/2020   Genetic testing 08/30/2020   Family history of breast cancer 08/16/2020   Malignant neoplasm of upper-outer quadrant of right breast in female, estrogen receptor positive (  HCC) 08/15/2020   COVID-19 04/16/2019   Cough 10/06/2015   S/P total knee arthroplasty 01/07/2013   Lymphadenopathy 12/14/2012   Sinusitis, acute 12/14/2012   Hyperlipidemia    Hypertension    Other idiopathic scoliosis, thoracolumbar region    PCP:  Duanne Butler DASEN, MD Pharmacy:   [facility] [phone] [place], [place] - [address] [place] [address] Phone:  [phone] Fax: [phone]  [facility] [address] [place] [address] Phone: [phone] Fax: [phone]     Social Drivers of Health (SDOH) Social History: SDOH Screenings   Food Insecurity: No Food Insecurity (12/26/2023)  Housing: Unknown (12/26/2023)  Transportation Needs: No Transportation Needs (12/26/2023)  Utilities: Not At Risk (12/26/2023)  Alcohol  Screen: Low Risk  (08/27/2023)  Depression (PHQ2-9): Low Risk  (08/27/2023)  Financial Resource Strain: Low Risk  (08/27/2023)  Physical Activity: Inactive (08/27/2023)  Social Connections: Moderately Isolated (12/26/2023)  Stress: No Stress Concern Present (08/27/2023)  Tobacco Use: Medium Risk (12/26/2023)  Health Literacy: Adequate Health Literacy (08/27/2023)   SDOH Interventions:     Readmission Risk Interventions     No data to display

## 2023-12-29 NOTE — Progress Notes (Signed)
 PROGRESS NOTE    Christina Lyons  FMW:[id number] DOB: 10-12-1940 DOA: 12/25/2023 PCP: Duanne Butler DASEN, MD   Brief Narrative:  This 83 years old female with PMH significant for bilateral knee replacement, GERD, hypertension, hyperlipidemia, osteoporosis, right breast cancer, heart murmur presented status post mechanical fall, hit her head but denies any loss of consciousness, not on any anticoagulation.  She has developed pain in the left knee.  She has stopped taking her blood pressure and hyperlipidemia medication 4 months ago.  Imaging confirms closed fracture of left distal femur.  Patient admitted for further evaluation,  orthopedics consulted , status post ORIF POD #1.   Assessment & Plan:   Principal Problem:   Closed fracture of left distal femur (HCC) Active Problems:   Hyperlipidemia   Hypertension   Malignant neoplasm of upper-outer quadrant of right breast in female, estrogen receptor positive (HCC)   Closed fracture of left distal femur: Status post mechanical fall: Patient presented status post mechanical fall with left leg pain. X-ray confirms left distal femur fracture. Continue adequate pain control. Orthopedics is consulted. Status post ORIF POD # 3. PT/OT  recommended Acute inpatient rehab, but declined ,  likely needs SNF. WBAT LLE, adequate pain control. Ortho signed off , can be discharged once SNF available. Follow-up 2 weeks in office for wound check and repeat x-ray.  Hyperlipidemia: Patient is not on any statins. LDL at goal.  Hypertension: Patient has been off of blood pressure medications. BP controlled off medications.  Malignant neoplasm of the upper outer quadrant of right breast: Hold Femara  for tonight. Follow-up outpatient.  Urinary burning: Obtain UA to rule out urinary tract infection.  DVT prophylaxis: SCDs Code Status: Full code Family Communication: No family at bedside. Disposition Plan:    Status is: Inpatient Remains  inpatient appropriate because: Admitted status post mechanical fall with closed fracture of left distal femur. Status post ORIF POD # 3.  PT/ OT pending ,  TOC notified for possible SNF placement   Consultants:  Orthopedics  Procedures:  S/P ORIF POD # 2 Antimicrobials:  Anti-infectives (From admission, onward)    Start     Dose/Rate Route Frequency Ordered Stop   12/26/23 1900  ceFAZolin  (ANCEF ) IVPB 2g/100 mL premix        2 g 200 mL/hr over 30 Minutes Intravenous Every 8 hours 12/26/23 1553 12/28/23 0755   12/26/23 1304  vancomycin  (VANCOCIN ) powder  Status:  Discontinued          As needed 12/26/23 1304 12/26/23 1346   12/26/23 1029  ceFAZolin  (ANCEF ) 2-4 GM/100ML-% IVPB  Status:  Discontinued       Note to Pharmacy: [facility], [place]: cabinet override      12/26/23 1029 12/26/23 1056   12/26/23 1015  ceFAZolin  (ANCEF ) IVPB 2g/100 mL premix        2 g 200 mL/hr over 30 Minutes Intravenous On call to O.R. 12/26/23 0927 12/26/23 1243      Subjective: Patient was seen and examined at bedside.Overnight events noted. Patient reports pain is reasonably controlled, She has participated with physical therapy yesterday. CIR was recommended. She is status post ORIF POD # 3.  She reports urinary burning.  Objective: Vitals:   12/28/23 1337 12/28/23 1942 12/29/23 0353 12/29/23 0735  BP: (!) 153/76 133/64 120/70 119/60  Pulse: 94 100 89 92  Resp:  15 15 17   Temp: 98 F (36.7 C) 98.6 F (37 C) 97.9 F (36.6 C) 98.2 F (36.8 C)  TempSrc:  Oral Oral  SpO2: 99% 99% (!) 77% 95%  Weight:      Height:        Intake/Output Summary (Last 24 hours) at 12/29/2023 1135 Last data filed at 12/29/2023 0900 Gross per 24 hour  Intake 240 ml  Output --  Net 240 ml   Filed Weights   12/26/23 1052  Weight: 88 kg    Examination:  General exam: Appears calm and comfortable, not in any acute distress.  Deconditioned. Respiratory system: CTA Bilaterally.  Respiratory effort normal.  RR  15 Cardiovascular system: S1 & S2 heard, RRR. No JVD, murmurs, rubs, gallops or clicks.  Gastrointestinal system: Abdomen is non distended, soft and non tender.Normal bowel sounds heard. Central nervous system: Alert and oriented x 3. No focal neurological deficits. Extremities: Status post ORIF POD # 3. Skin: No rashes, lesions or ulcers Psychiatry: Judgement and insight appear normal. Mood & affect appropriate.     Data Reviewed: I have personally reviewed following labs and imaging studies  CBC: Recent Labs  Lab 12/25/23 1705 12/26/23 0329 12/27/23 0558 12/28/23 0539 12/29/23 0357  WBC 9.0 7.5 11.5* 9.1 8.6  NEUTROABS 7.3  --   --   --   --   HGB 13.9 12.7 9.9* 8.9* 8.3*  HCT 42.4 37.5 30.0* 27.1* 24.8*  MCV 103.9* 102.2* 103.4* 103.8* 103.3*  PLT 153 133* 127* 116* 133*   Basic Metabolic Panel: Recent Labs  Lab 12/25/23 1705 12/27/23 0558  NA 139 138  K 4.5 3.6  CL 102 102  CO2 25 25  GLUCOSE 92 121*  BUN 14 12  CREATININE 0.56 0.68  CALCIUM  9.3 8.2*   GFR: Estimated Creatinine Clearance: 55.3 mL/min (by C-G formula based on SCr of 0.68 mg/dL). Liver Function Tests: No results for input(s): AST, ALT, ALKPHOS, BILITOT, PROT, ALBUMIN in the last 168 hours. No results for input(s): LIPASE, AMYLASE in the last 168 hours. No results for input(s): AMMONIA in the last 168 hours. Coagulation Profile: No results for input(s): INR, PROTIME in the last 168 hours. Cardiac Enzymes: No results for input(s): CKTOTAL, CKMB, CKMBINDEX, TROPONINI in the last 168 hours. BNP (last 3 results) No results for input(s): PROBNP in the last 8760 hours. HbA1C: No results for input(s): HGBA1C in the last 72 hours. CBG: No results for input(s): GLUCAP in the last 168 hours. Lipid Profile: Recent Labs    12/29/23 0357  CHOL 168  HDL 52  LDLCALC 99  TRIG 83  CHOLHDL 3.2   Thyroid  Function Tests: No results for input(s): TSH, T4TOTAL,  FREET4, T3FREE, THYROIDAB in the last 72 hours. Anemia Panel: No results for input(s): VITAMINB12, FOLATE, FERRITIN, TIBC, IRON, RETICCTPCT in the last 72 hours. Sepsis Labs: No results for input(s): PROCALCITON, LATICACIDVEN in the last 168 hours.  No results found for this or any previous visit (from the past 240 hours).   Radiology Studies: No results found.  Scheduled Meds:  cholecalciferol  4,000 Units Oral Daily   docusate sodium   100 mg Oral BID   enoxaparin  (LOVENOX ) injection  40 mg Subcutaneous Q24H   pantoprazole   40 mg Oral Daily   Continuous Infusions:     LOS: 4 days    Time spent: 35 mins    Darcel Dawley, MD [facility]   If 7PM-7AM, please contact night-coverage

## 2023-12-30 LAB — HEMOGLOBIN AND HEMATOCRIT, BLOOD
HCT: 27.8 % — ABNORMAL LOW (ref 36.0–46.0)
Hemoglobin: 9.2 g/dL — ABNORMAL LOW (ref 12.0–15.0)

## 2023-12-30 MED ORDER — ENSURE PLUS HIGH PROTEIN PO LIQD
237.0000 mL | Freq: Two times a day (BID) | ORAL | Status: DC
Start: 2023-12-30 — End: 2023-12-31
  Administered 2023-12-31: 237 mL via ORAL

## 2023-12-30 NOTE — Progress Notes (Signed)
 Physical Therapy Treatment Patient Details Name: Christina Lyons MRN: [id number] DOB: 01-08-1941 Today's Date: 12/30/2023   History of Present Illness Christina Lyons is a 83 y.o. female admitted 12/24/24 after ground-level fall, sustaining left periprosthetic supracondylar distal femur fracture. Pt s/p ORIF 9/26. PMHx: DDD, GERD, HLD, HTN, osteoporosis, idiopathic thoracolumbar scoliosis, right breast cancer, heart murmur, and B TKA.    PT Comments  Pt resting in bed on arrival, pleasant and agreeable to session with slow but steady progress towards acute goals. Pt continues to be limited by LLE pain and weakness, limiting standing and weight bearing tolerance. Pt completing bed mobility with mod A to manage LLE and trunk and able to come to stand from slightly elevated EOB with grossly mod A. Pt able to step pivot EOB>BSC with min A to maintain balance. Pt performing seated LE exercises (see below) and verbalizing understanding of completion throughout day. Pt continues to benefit from skilled PT services to progress toward functional mobility goals.     If plan is discharge home, recommend the following: Two people to help with walking and/or transfers;A lot of help with bathing/dressing/bathroom;Assistance with cooking/housework;Assist for transportation;Help with stairs or ramp for entrance   Can travel by private vehicle        Equipment Recommendations  Wheelchair (measurements PT);Wheelchair cushion (measurements PT);Other (comment);BSC/3in1 (drop arm)    Recommendations for Other Services       Precautions / Restrictions Precautions Precautions: Fall Recall of Precautions/Restrictions: Intact Restrictions Weight Bearing Restrictions Per Provider Order: Yes LLE Weight Bearing Per Provider Order: Weight bearing as tolerated     Mobility  Bed Mobility Overal bed mobility: Needs Assistance Bed Mobility: Supine to Sit     Supine to sit: Mod assist     General bed mobility  comments: Pt needed assist with bringing LLE over to the edge of the bed and then scooting hips out.  HOB elevated with use of hand rail during this session.    Transfers Overall transfer level: Needs assistance Equipment used: Rolling walker (2 wheels) Transfers: Sit to/from Stand, Bed to chair/wheelchair/BSC Sit to Stand: From elevated surface, Mod assist   Step pivot transfers: Mod assist       General transfer comment: Pt able to complete sit to stand with mod assist and increased time from elevated EOB and pivot on R to BSC, pt able to step RLE x1, pt standing from [facility] with min A with BSC removed and chair pulled up behind pt as she bagan to fatigue in standing    Ambulation/Gait               General Gait Details: Unable   Stairs             Wheelchair Mobility     Tilt Bed    Modified Rankin (Stroke Patients Only)       Balance Overall balance assessment: Needs assistance Sitting-balance support: Bilateral upper extremity supported, Feet supported Sitting balance-Leahy Scale: Fair Sitting balance - Comments: Pt sat EOB with close supervision   Standing balance support: Bilateral upper extremity supported, During functional activity, Reliant on assistive device for balance Standing balance-Leahy Scale: Poor Standing balance comment: Mod assist needed for transfers using the RW for BUE support.                            Communication Communication Communication: No apparent difficulties  Cognition Arousal: Alert Behavior During Therapy: [facility] for tasks  assessed/performed   PT - Cognitive impairments: No apparent impairments                         Following commands: Intact      Cueing Cueing Techniques: Verbal cues, Tactile cues  Exercises Other Exercises Other Exercises: educated pt on ankle pumps, quad sets and glute sets while up in chair throughout day with pt verbalizing undestanding    General Comments         Pertinent Vitals/Pain Pain Assessment Pain Assessment: Faces Faces Pain Scale: Hurts little more Pain Location: L hip Pain Descriptors / Indicators: Discomfort, Aching, Sore Pain Intervention(s): Monitored during session, Limited activity within patient's tolerance    Home Living                          Prior Function            PT Goals (current goals can now be found in the care plan section) Acute Rehab PT Goals Patient Stated Goal: Regain independence and return home PT Goal Formulation: With patient Time For Goal Achievement: 01/10/24 Progress towards PT goals: Progressing toward goals    Frequency    Min 3X/week      PT Plan      Co-evaluation              AM-PAC PT 6 Clicks Mobility   Outcome Measure  Help needed turning from your back to your side while in a flat bed without using bedrails?: A Little Help needed moving from lying on your back to sitting on the side of a flat bed without using bedrails?: A Little Help needed moving to and from a bed to a chair (including a wheelchair)?: A Lot Help needed standing up from a chair using your arms (e.g., wheelchair or bedside chair)?: A Lot Help needed to walk in hospital room?: Total Help needed climbing 3-5 steps with a railing? : Total 6 Click Score: 12    End of Session Equipment Utilized During Treatment: Gait belt Activity Tolerance: Patient tolerated treatment well Patient left: in chair;with call bell/phone within reach;with chair alarm set Nurse Communication: Mobility status PT Visit Diagnosis: Difficulty in walking, not elsewhere classified (R26.2);Other abnormalities of gait and mobility (R26.89);Unsteadiness on feet (R26.81)     Time: 9045-8991 PT Time Calculation (min) (ACUTE ONLY): 14 min  Charges:    $Therapeutic Activity: 8-22 mins PT General Charges $$ ACUTE PT VISIT: 1 Visit                     Christina Lyons R. PTA Acute Rehabilitation Services Office:  [phone]   Christina Lyons 12/30/2023, 4:01 PM

## 2023-12-30 NOTE — Progress Notes (Signed)
 PROGRESS NOTE    [name]  FMW:[id number] DOB: 1940/05/11 DOA: 12/25/2023 PCP: Duanne Butler DASEN, MD   Brief Narrative:  This 83 years old female with PMH significant for bilateral knee replacement, GERD, hypertension, hyperlipidemia, osteoporosis, right breast cancer, heart murmur presented status post mechanical fall, hit her head but denies any loss of consciousness, not on any anticoagulation.  She has developed pain in the left knee.  She has stopped taking her blood pressure and hyperlipidemia medication 4 months ago.  Imaging confirms closed fracture of left distal femur.  Patient admitted for further evaluation,  orthopedics consulted , status post ORIF POD #1.   Assessment & Plan:   Principal Problem:   Closed fracture of left distal femur (HCC) Active Problems:   Hyperlipidemia   Hypertension   Malignant neoplasm of upper-outer quadrant of right breast in female, estrogen receptor positive (HCC)   Closed fracture of left distal femur: Status post mechanical fall: Patient presented status post mechanical fall with left leg pain. X-ray confirms left distal femur fracture. Continue adequate pain control. Orthopedics is consulted. Status post ORIF POD # 4. PT/OT  recommended Acute inpatient rehab, but declined ,  likely needs SNF. WBAT LLE, adequate pain control. Ortho signed off , can be discharged once SNF available. Follow-up 2 weeks in office for wound check and repeat x-ray. Insurance authorization pending.  Patient medically cleared.  Hyperlipidemia: Patient is not on any statins. LDL at goal.  Hypertension: Patient has been off of blood pressure medications. BP controlled off medications.  Malignant neoplasm of the upper outer quadrant of right breast: Hold Femara  for tonight. Follow-up outpatient.  Urinary burning: Obtain UA to rule out urinary tract infection.  DVT prophylaxis: SCDs Code Status: Full code Family Communication: No family at  bedside. Disposition Plan:    Status is: Inpatient Remains inpatient appropriate because: Admitted status post mechanical fall with closed fracture of left distal femur. Status post ORIF POD # 3.  Patient medically cleared,  awaiting SNF placement.   Consultants:  Orthopedics  Procedures:  S/P ORIF POD # 2 Antimicrobials:  Anti-infectives (From admission, onward)    Start     Dose/Rate Route Frequency Ordered Stop   12/26/23 1900  ceFAZolin  (ANCEF ) IVPB 2g/100 mL premix        2 g 200 mL/hr over 30 Minutes Intravenous Every 8 hours 12/26/23 1553 12/28/23 0755   12/26/23 1304  vancomycin  (VANCOCIN ) powder  Status:  Discontinued          As needed 12/26/23 1304 12/26/23 1346   12/26/23 1029  ceFAZolin  (ANCEF ) 2-4 GM/100ML-% IVPB  Status:  Discontinued       Note to Pharmacy: [facility], [place]: cabinet override      12/26/23 1029 12/26/23 1056   12/26/23 1015  ceFAZolin  (ANCEF ) IVPB 2g/100 mL premix        2 g 200 mL/hr over 30 Minutes Intravenous On call to O.R. 12/26/23 0927 12/26/23 1243      Subjective: Patient was seen and examined at bedside.Overnight events noted. Patient reports pain is reasonably controlled, She has participated with physical therapy. She is status post ORIF POD # 3.   Objective: Vitals:   12/29/23 1438 12/29/23 2007 12/30/23 0606 12/30/23 0717  BP: 129/65 135/61 129/70 131/69  Pulse:  96 92 95  Resp: 17 17 18 17   Temp: 98 F (36.7 C) 98.4 F (36.9 C) 98.4 F (36.9 C) 97.7 F (36.5 C)  TempSrc: Oral  Oral Oral  SpO2:  99% 97% 95% 96%  Weight:      Height:        Intake/Output Summary (Last 24 hours) at 12/30/2023 1329 Last data filed at 12/30/2023 0618 Gross per 24 hour  Intake 480 ml  Output 1100 ml  Net -620 ml   Filed Weights   12/26/23 1052  Weight: 88 kg    Examination:  General exam: Appears calm and comfortable, not in any acute distress.  Deconditioned. Respiratory system: CTA Bilaterally.  Respiratory effort normal.  RR  15 Cardiovascular system: S1 & S2 heard, RRR. No JVD, murmurs, rubs, gallops or clicks.  Gastrointestinal system: Abdomen is non distended, soft and non tender.Normal bowel sounds heard. Central nervous system: Alert and oriented x 3. No focal neurological deficits. Extremities: Status post ORIF POD # 3. Skin: No rashes, lesions or ulcers Psychiatry: Judgement and insight appear normal. Mood & affect appropriate.     Data Reviewed: I have personally reviewed following labs and imaging studies  CBC: Recent Labs  Lab 12/25/23 1705 12/26/23 0329 12/27/23 0558 12/28/23 0539 12/29/23 0357 12/30/23 1028  WBC 9.0 7.5 11.5* 9.1 8.6  --   NEUTROABS 7.3  --   --   --   --   --   HGB 13.9 12.7 9.9* 8.9* 8.3* 9.2*  HCT 42.4 37.5 30.0* 27.1* 24.8* 27.8*  MCV 103.9* 102.2* 103.4* 103.8* 103.3*  --   PLT 153 133* 127* 116* 133*  --    Basic Metabolic Panel: Recent Labs  Lab 12/25/23 1705 12/27/23 0558  NA 139 138  K 4.5 3.6  CL 102 102  CO2 25 25  GLUCOSE 92 121*  BUN 14 12  CREATININE 0.56 0.68  CALCIUM  9.3 8.2*   GFR: Estimated Creatinine Clearance: 55.3 mL/min (by C-G formula based on SCr of 0.68 mg/dL). Liver Function Tests: No results for input(s): AST, ALT, ALKPHOS, BILITOT, PROT, ALBUMIN in the last 168 hours. No results for input(s): LIPASE, AMYLASE in the last 168 hours. No results for input(s): AMMONIA in the last 168 hours. Coagulation Profile: No results for input(s): INR, PROTIME in the last 168 hours. Cardiac Enzymes: No results for input(s): CKTOTAL, CKMB, CKMBINDEX, TROPONINI in the last 168 hours. BNP (last 3 results) No results for input(s): PROBNP in the last 8760 hours. HbA1C: No results for input(s): HGBA1C in the last 72 hours. CBG: No results for input(s): GLUCAP in the last 168 hours. Lipid Profile: Recent Labs    12/29/23 0357  CHOL 168  HDL 52  LDLCALC 99  TRIG 83  CHOLHDL 3.2   Thyroid  Function  Tests: No results for input(s): TSH, T4TOTAL, FREET4, T3FREE, THYROIDAB in the last 72 hours. Anemia Panel: No results for input(s): VITAMINB12, FOLATE, FERRITIN, TIBC, IRON, RETICCTPCT in the last 72 hours. Sepsis Labs: No results for input(s): PROCALCITON, LATICACIDVEN in the last 168 hours.  No results found for this or any previous visit (from the past 240 hours).   Radiology Studies: No results found.  Scheduled Meds:  cholecalciferol  4,000 Units Oral Daily   docusate sodium   100 mg Oral BID   enoxaparin  (LOVENOX ) injection  40 mg Subcutaneous Q24H   feeding supplement  237 mL Oral BID BM   pantoprazole   40 mg Oral Daily   Continuous Infusions:   LOS: 5 days    Time spent: 35 mins    Darcel Dawley, MD [facility]   If 7PM-7AM, please contact night-coverage

## 2023-12-30 NOTE — Progress Notes (Signed)
 Nutrition Follow Up  DOCUMENTATION CODES:   Non-severe (moderate) malnutrition in context of social or environmental circumstances, Obesity unspecified (poor/decreased appetite associated with aging)  INTERVENTION:  Continue regular diet to provide increased options and promote adequate intake of calories and protein for post op healing; encouraged ordering protein at all meals  Continue vitamin d  supplementation 1,000 units daily  Ensure Plus High Protein po BID, each supplement provides 350 kcal and 20 grams of protein.  Attached nutrition discharge instructions to AVS  NUTRITION DIAGNOSIS:   Moderate Malnutrition related to social / environmental circumstances, decreased appetite as evidenced by mild fat depletion, mild muscle depletion, moderate muscle depletion. New diagnosis  GOAL:   Patient will meet greater than or equal to 90% of their needs Progressing  MONITOR:   PO intake, Supplement acceptance  REASON FOR ASSESSMENT:   Consult Assessment of nutrition requirement/status, Hip fracture protocol  ASSESSMENT:   Pt with hx of  GERD, HTN, HLD, osteoporosis, and R breast cancer. Hx of L knee replacement (07/2022) and breast lumpectomy (08/2020). Admitted after mechanical fall, diagnosed L distal femur fx.  9/25 admitted 9/26 ORIF L distal femur  Expected discharge to SNF. Pt reports good appetite, documentation shows 100% intake. Discussed importance of protein intake post op and encouraged ordering protein at all meals even once discharged. Pt agreeable to Ensure shakes as well and recommend her to continue intake for 1 month after discharge. Pt reports no constipation but has not had recorded bowel movement in 5 days, will continue to monitor.   Pt reports she would eat 2-3 x per day PTA, but would eat smaller portions. Endorses lower appetite than a few years ago. Pt reports drinking coffee in morning then eating a light lunch and dinner. Pt reports she takes care of  son and son does not like a lot of meat so she usually does not prepare meat at home since it just the two of them. She reports she does like eggs and will prepare those sometimes. Discussed importance of protein to help post op recovery and encouraged ordering protein at all meals while admitted here and at SNF since the prep work is taken care of.   Nutrition focused physical exam shows mild fat depletion and moderate/mild muscle depletion. Suspect depletion related to malnutrition in the context of decreased appetite with aging and inadequate protein intake.   Average Meal Completion: 9/27-9/29: 100% average intake x 4 recorded meals  Medications: Colace BID Protonix  Cefazolin   Vitamin D3 1000 units daily  Labs reviewed Vitamin D  28.39 (low)  NUTRITION - FOCUSED PHYSICAL EXAM:  Flowsheet Row Most Recent Value  Orbital Region Mild depletion  Upper Arm Region Mild depletion  Thoracic and Lumbar Region No depletion  Buccal Region Mild depletion  Temple Region Mild depletion  Clavicle Bone Region Moderate depletion  Clavicle and Acromion Bone Region Moderate depletion  Scapular Bone Region Moderate depletion  Dorsal Hand Moderate depletion  Patellar Region Unable to assess  [BLE edema]  Anterior Thigh Region Unable to assess  [BLE edema]  Posterior Calf Region Unable to assess  [BLE edema]  Edema (RD Assessment) Mild  [BLE edema]  Hair Reviewed  Eyes Reviewed  Mouth Reviewed  Skin Reviewed  Nails Reviewed    Diet Order:   Diet Order             Diet regular Room service appropriate? Yes; Fluid consistency: Thin  Diet effective now  EDUCATION NEEDS:   Education needs have been addressed  Skin:  Skin Assessment: Reviewed RN Assessment  Last BM:  9/25  Height:   Ht Readings from Last 1 Encounters:  12/26/23 5' (1.524 m)    Weight:   Wt Readings from Last 1 Encounters:  12/26/23 88 kg    Ideal Body Weight:  50 kg  BMI:  Body mass  index is 37.89 kg/m.  Estimated Nutritional Needs:   Kcal:  1400-1600  Protein:  60-80g  Fluid:  1.4-1.6L    Josette Glance, MS, RDN, LDN Clinical Dietitian I Please reach out via secure chat

## 2023-12-30 NOTE — Plan of Care (Signed)

## 2023-12-30 NOTE — TOC Progression Note (Addendum)
 Transition of Care [facility]) - Progression Note    Patient Details  Name: Christina Lyons MRN: [id number] Date of Birth: June 07, 1940  Transition of [facility]) CM/SW Contact  Bridget Cordella Simmonds, LCSW Phone Number: 12/30/2023, 10:41 AM  Clinical Narrative:   Bed offers discussed with pt and niece [name] (on speakerphone)  CSW unable to reach son.  They will accept offer at [facility].  1055:  [facility] start [employer] auth.     Expected Discharge Plan: Skilled Nursing Facility Barriers to Discharge: SNF Pending bed offer               Expected Discharge Plan and Services In-house Referral: Clinical Social Work   Post Acute Care Choice: Skilled Nursing Facility Living arrangements for the past 2 months: Single Family Home                                       Social Drivers of Health (SDOH) Interventions SDOH Screenings   Food Insecurity: No Food Insecurity (12/26/2023)  Housing: Unknown (12/26/2023)  Transportation Needs: No Transportation Needs (12/26/2023)  Utilities: Not At Risk (12/26/2023)  Alcohol  Screen: Low Risk  (08/27/2023)  Depression (PHQ2-9): Low Risk  (08/27/2023)  Financial Resource Strain: Low Risk  (08/27/2023)  Physical Activity: Inactive (08/27/2023)  Social Connections: Moderately Isolated (12/26/2023)  Stress: No Stress Concern Present (08/27/2023)  Tobacco Use: Medium Risk (12/26/2023)  Health Literacy: Adequate Health Literacy (08/27/2023)    Readmission Risk Interventions     No data to display

## 2023-12-31 DIAGNOSIS — F411 Generalized anxiety disorder: Secondary | ICD-10-CM | POA: Diagnosis not present

## 2023-12-31 DIAGNOSIS — C50919 Malignant neoplasm of unspecified site of unspecified female breast: Secondary | ICD-10-CM | POA: Diagnosis not present

## 2023-12-31 DIAGNOSIS — I10 Essential (primary) hypertension: Secondary | ICD-10-CM | POA: Diagnosis not present

## 2023-12-31 DIAGNOSIS — S72002A Fracture of unspecified part of neck of left femur, initial encounter for closed fracture: Secondary | ICD-10-CM | POA: Diagnosis not present

## 2023-12-31 DIAGNOSIS — E440 Moderate protein-calorie malnutrition: Secondary | ICD-10-CM | POA: Insufficient documentation

## 2023-12-31 DIAGNOSIS — M62838 Other muscle spasm: Secondary | ICD-10-CM | POA: Diagnosis not present

## 2023-12-31 LAB — CBC
HCT: 23.8 % — ABNORMAL LOW (ref 36.0–46.0)
Hemoglobin: 7.7 g/dL — ABNORMAL LOW (ref 12.0–15.0)
MCH: 34.1 pg — ABNORMAL HIGH (ref 26.0–34.0)
MCHC: 32.4 g/dL (ref 30.0–36.0)
MCV: 105.3 fL — ABNORMAL HIGH (ref 80.0–100.0)
Platelets: 164 K/uL (ref 150–400)
RBC: 2.26 MIL/uL — ABNORMAL LOW (ref 3.87–5.11)
RDW: 13.9 % (ref 11.5–15.5)
WBC: 7.4 K/uL (ref 4.0–10.5)
nRBC: 0 % (ref 0.0–0.2)

## 2023-12-31 LAB — PHOSPHORUS: Phosphorus: 3.9 mg/dL (ref 2.5–4.6)

## 2023-12-31 LAB — BASIC METABOLIC PANEL WITH GFR
Anion gap: 10 (ref 5–15)
BUN: 17 mg/dL (ref 8–23)
CO2: 26 mmol/L (ref 22–32)
Calcium: 8.2 mg/dL — ABNORMAL LOW (ref 8.9–10.3)
Chloride: 103 mmol/L (ref 98–111)
Creatinine, Ser: 0.55 mg/dL (ref 0.44–1.00)
GFR, Estimated: >60 mL/min (ref >60)
Glucose, Bld: 107 mg/dL — ABNORMAL HIGH (ref 70–99)
Potassium: 3.4 mmol/L — ABNORMAL LOW (ref 3.5–5.1)
Sodium: 139 mmol/L (ref 135–145)

## 2023-12-31 LAB — MAGNESIUM: Magnesium: 2.2 mg/dL (ref 1.7–2.4)

## 2023-12-31 MED ORDER — MONTELUKAST SODIUM 10 MG PO TABS
10.0000 mg | ORAL_TABLET | Freq: Every day | ORAL | Status: AC
Start: 2023-12-31 — End: ?

## 2023-12-31 MED ORDER — VITAMIN D3 25 MCG PO TABS: 4000.0000 [IU] | ORAL_TABLET | Freq: Every day | ORAL | Status: AC

## 2023-12-31 MED ORDER — ENSURE PLUS HIGH PROTEIN PO LIQD: 237.0000 mL | Freq: Two times a day (BID) | ORAL | Status: AC

## 2023-12-31 MED ORDER — LORAZEPAM 1 MG PO TABS: 1.0000 mg | ORAL_TABLET | Freq: Two times a day (BID) | ORAL | 0 refills | Status: DC | PRN

## 2023-12-31 MED ORDER — POLYETHYLENE GLYCOL 3350 17 G PO PACK: 17.0000 g | PACK | Freq: Every day | ORAL | Status: AC

## 2023-12-31 MED ORDER — ALUM & MAG HYDROXIDE-SIMETH 200-200-20 MG/5ML PO SUSP: 30.0000 mL | ORAL | Status: AC | PRN

## 2023-12-31 MED ORDER — DOCUSATE SODIUM 100 MG PO CAPS: 100.0000 mg | ORAL_CAPSULE | Freq: Two times a day (BID) | ORAL | Status: AC

## 2023-12-31 NOTE — TOC Progression Note (Signed)
 Transition of Care [facility]) - Progression Note    Patient Details  Name: Christina Lyons MRN: [id number] Date of Birth: December 13, 1940  Transition of Care [facility]) CM/SW Contact  Bridget Cordella Simmonds, LCSW Phone Number: 12/31/2023, 9:59 AM  Clinical Narrative:   Message from [facility]: [facility]leen [facility] approved, they can receive pt today.  MD informed.     Expected Discharge Plan: Skilled Nursing Facility Barriers to Discharge: SNF Pending bed offer               Expected Discharge Plan and Services In-house Referral: Clinical Social Work   Post Acute Care Choice: Skilled Nursing Facility Living arrangements for the past 2 months: Single Family Home                                       Social Drivers of Health (SDOH) Interventions SDOH Screenings   Food Insecurity: No Food Insecurity (12/26/2023)  Housing: Unknown (12/26/2023)  Transportation Needs: No Transportation Needs (12/26/2023)  Utilities: Not At Risk (12/26/2023)  Alcohol  Screen: Low Risk  (08/27/2023)  Depression (PHQ2-9): Low Risk  (08/27/2023)  Financial Resource Strain: Low Risk  (08/27/2023)  Physical Activity: Inactive (08/27/2023)  Social Connections: Moderately Isolated (12/26/2023)  Stress: No Stress Concern Present (08/27/2023)  Tobacco Use: Medium Risk (12/26/2023)  Health Literacy: Adequate Health Literacy (08/27/2023)    Readmission Risk Interventions     No data to display

## 2023-12-31 NOTE — Discharge Summary (Signed)
 Physician Discharge Summary   Patient: Christina Lyons MRN: [id number] DOB: 01-18-1941  Admit date:     12/25/2023  Discharge date: 12/31/23  Discharge Physician: Burnard DELENA Cunning   PCP: Duanne Butler DASEN, MD   Recommendations at discharge:    Orthopedic Surgery Recommends -- -- Weightbearing as tolerated on LEFT Leg -- Range of Motion: Unrestricted  -- Incisional and dressing care: Change Mepilex dressings as needed -- Showering: Okay begin getting incisions wet starting 12/30/2023 if no drainage from incisions -- Follow-up in clinic 2 weeks after discharge from the hospital for wound check and repeat x-rays  Follow up with your Primary Care provider in 1-2 weeks Repeat CBC, CMP at follow up  Discharge Diagnoses: Principal Problem:   Closed fracture of left distal femur (HCC) Active Problems:   Hyperlipidemia   Hypertension   Malignant neoplasm of upper-outer quadrant of right breast in female, estrogen receptor positive (HCC)   Malnutrition of moderate degree  Resolved Problems:   * No resolved hospital problems. *  Hospital Course:  83 years old female with PMH significant for bilateral knee replacement, GERD, hypertension, hyperlipidemia, osteoporosis, right breast cancer, heart murmur presented status post mechanical fall, hit her head but denies any loss of consciousness, not on any anticoagulation. She has developed pain in the left knee. She has stopped taking her blood pressure and hyperlipidemia medication 4 months ago. Imaging confirms closed fracture of left distal femur. Patient admitted for further evaluation, orthopedics consulted , status post ORIF on 12/26/2023 with Dr. Kendal.  Further hospital course and management as outlined below.  12/31/23 AM -- pt seen and examined at bedside this AM.  She reports constipation has finally resolved with stool softeners, having good bowel movements now.  Pain is controlled adequately.  Pt denies other complaints or concerns  and is medically stable for discharge to SNF for rehab today.    Assessment and Plan:  Closed fracture of left distal femur: Status post mechanical fall: Patient presented status post mechanical fall with left leg pain. X-ray confirmed left distal femur fracture. Continue pain control per orders.  Pt reports pain control has been adequate. Orthopedics consulted.  Status post ORIF on 12/26/23 with Dr. Kendal. PT/OT  recommended Acute inpatient rehab, but declined Will discharge to SNF for rehab Weight-bearing as tolerated on LLE Follow-up with Ortho in 2 weeks in office for wound check and repeat x-ray. Authorization approved for SNF/rehab discharge today   Hyperlipidemia: Patient is not on statin. LDL at goal.   Hypertension: BP controlled off medications. Monitor BP's in follow up   Malignant neoplasm of the upper outer quadrant of right breast: On Femara   Follow-up outpatient with oncology as scheduled.   Urinary burning - resolved UA was ordered 9/29 but never collected. Pt afebrile and reports this is improved. ?Atrophic vaginitis in post-menopause. Continue to monitor in follow up.       Consultants: Orthopedic surgery Procedures performed:  Open reduction internal fixation of left supracondylar distal femur fracture  Disposition: Skilled nursing facility Diet recommendation:  Regular diet DISCHARGE MEDICATION: Allergies as of 12/31/2023       Reactions   Sulfa Antibiotics Hives        Medication List     STOP taking these medications    HYDROcodone -acetaminophen  7.5-325 MG tablet Commonly known as: Norco Replaced by: HYDROcodone -acetaminophen  5-325 MG tablet   losartan  50 MG tablet Commonly known as: COZAAR    omeprazole  20 MG capsule Commonly known as: PRILOSEC   rosuvastatin   10 MG tablet Commonly known as: Crestor    Vitamin D  50 MCG (2000 UT) Caps Replaced by: vitamin D3 25 MCG tablet       TAKE these medications    acetaminophen   325 MG tablet Commonly known as: TYLENOL  Take 1-2 tablets (325-650 mg total) by mouth every 6 (six) hours as needed for mild pain (pain score 1-3) or fever (or temp > 100.5).   albuterol  108 (90 Base) MCG/ACT inhaler Commonly known as: VENTOLIN  HFA Inhale 2 puffs into the lungs every 6 (six) hours as needed for wheezing or shortness of breath.   alum & mag hydroxide-simeth 200-200-20 MG/5ML suspension Commonly known as: MAALOX/MYLANTA Take 30 mLs by mouth every 4 (four) hours as needed for indigestion or heartburn.   apixaban 2.5 MG Tabs tablet Commonly known as: Eliquis Take 1 tablet (2.5 mg total) by mouth 2 (two) times daily.   docusate sodium  100 MG capsule Commonly known as: COLACE Take 1 capsule (100 mg total) by mouth 2 (two) times daily.   feeding supplement Liqd Take 237 mLs by mouth 2 (two) times daily between meals.   gabapentin  300 MG capsule Commonly known as: NEURONTIN  Take 300 mg by mouth 2 (two) times daily as needed (pain).   HYDROcodone -acetaminophen  5-325 MG tablet Commonly known as: NORCO/VICODIN Take 1-2 tablets by mouth every 4 (four) hours as needed for moderate pain (pain score 4-6) or severe pain (pain score 7-10) (1 tablet pain score 4-6, 2 tablets pain score 7-10). Replaces: HYDROcodone -acetaminophen  7.5-325 MG tablet   letrozole  2.5 MG tablet Commonly known as: FEMARA  TAKE 1 TABLET BY MOUTH EVERY DAY   LORazepam  1 MG tablet Commonly known as: ATIVAN  Take 1 tablet (1 mg total) by mouth 2 (two) times daily as needed for anxiety.   methocarbamol  500 MG tablet Commonly known as: ROBAXIN  Take 1 tablet (500 mg total) by mouth every 6 (six) hours as needed for muscle spasms.   montelukast  10 MG tablet Commonly known as: SINGULAIR  Take 1 tablet (10 mg total) by mouth at bedtime.   multivitamin with minerals Tabs tablet Take 1 tablet by mouth daily with supper.   polyethylene glycol 17 g packet Commonly known as: MIRALAX  / GLYCOLAX  Take 17 g by  mouth daily.   sennosides-docusate sodium  8.6-50 MG tablet Commonly known as: SENOKOT-S Take 1 tablet by mouth daily as needed for constipation.   traZODone  50 MG tablet Commonly known as: DESYREL  Take 1 tablet (50 mg total) by mouth at bedtime as needed for sleep.   venlafaxine  XR 37.5 MG 24 hr capsule Commonly known as: EFFEXOR -XR TAKE 1 CAPSULE BY MOUTH DAILY WITH BREAKFAST.   vitamin D3 25 MCG tablet Commonly known as: CHOLECALCIFEROL Take 4 tablets (4,000 Units total) by mouth daily. Start taking on: January 01, 2024 Replaces: Vitamin D  50 MCG (2000 UT) Caps               Discharge Care Instructions  (From admission, onward)           Start     Ordered   12/31/23 0000  Leave dressing on - Keep it clean, dry, and intact until clinic visit       Comments: Reinforce dressing as needed or change if soiled   12/31/23 1019            Follow-up Information     Haddix, Franky SQUIBB, MD. Schedule an appointment as soon as possible for a visit in 2 week(s).   Specialty: Orthopedic Surgery Why:  For wound check and repeat x-rays Contact information: [address] [place] [address] [phone]                Discharge Exam: Filed Weights   12/26/23 1052  Weight: 88 kg   General exam: awake, alert, no acute distress HEENT: moist mucus membranes, hearing grossly normal  Respiratory system: CTAB, no wheezes, rales or rhonchi, normal respiratory effort. Cardiovascular system: normal S1/S2, RRR Gastrointestinal system: soft, NT, ND, +bowel sounds. Central nervous system: A&O x 3. no gross focal neurologic deficits, normal speech Extremities: no edema, normal tone Skin: dry, intact, normal temperature Psychiatry: normal mood, congruent affect, judgement and insight appear normal   Condition at discharge: stable  The results of significant diagnostics from this hospitalization (including imaging, microbiology, ancillary and laboratory) are listed  below for reference.   Imaging Studies: DG FEMUR PORT MIN 2 VIEWS LEFT Result Date: 12/26/2023 EXAM: 2 VIEW(S) XRAY OF THE LEFT FEMUR 12/26/2023 03:08:00 PM COMPARISON: None available. CLINICAL HISTORY: [id number] Fracture [id number]. Fracture [id number] FINDINGS: BONES AND JOINTS: Left total knee arthroplasty in place. Lateral plate and screw fixation of left femur in place. Distal femoral periprosthetic fracture with near anatomic alignment. No joint dislocation. SOFT TISSUES: Expected soft tissue air noted. IMPRESSION: 1. Distal femoral periprosthetic fracture with near anatomic alignment. 2. Lateral plate and screw fixation of left femur in place. 3. Left total knee arthroplasty in place. Electronically signed by: Lonni Necessary MD 12/26/2023 07:30 PM EDT RP Workstation: [id number]   DG FEMUR MIN 2 VIEWS LEFT Result Date: 12/26/2023 EXAM: XR Left Distal Femur, 2 Views TECHNIQUE: Fluoroscopy was provided by the radiology department for procedure. Radiologist was not present during examination. FLUOROSCOPY DOSE AND TYPE: Radiation Dose Index: Reference Air Kerma (in mGy) = 3.84 Fluoroscopy time: 49 seconds. COMPARISON: None available. CLINICAL HISTORY: Surgery, elective [id number]. ORIF Left Distal Femur; RSTO by TMS; LMP verified prior to exam, TMS; :49 sec fluoro; 3.84 mGy FINDINGS: 6 intraoperative images are submitted. Lateral plate and screw fixation was performed. The comminuted distal femur fracture is reduced. Minimal medial displacement remains. Hardware is intact. No new fractures are present. IMPRESSION: 1. Comminuted distal femur fracture reduced with minimal medial displacement. 2. Lateral plate and screw fixation performed with intact hardware. 3. No new fractures. NOTE: Intraoperative fluoroscopic spot images as above. Please refer to the intraoperative report for full details. Electronically signed by: Lonni Necessary MD 12/26/2023 07:29 PM EDT RP Workstation: [id number]   DG C-Arm 1-60 Min-No  Report Result Date: 12/26/2023 Fluoroscopy was utilized by the requesting physician.  No radiographic interpretation.   DG Chest Portable 1 View Result Date: 12/25/2023 CLINICAL DATA:  preop EXAM: PORTABLE CHEST 1 VIEW COMPARISON:  Chest x-ray 04/16/2019 FINDINGS: The heart and mediastinal contours are within normal limits. Elevated left hemidiaphragm. No focal consolidation. No pulmonary edema. No pleural effusion. No pneumothorax. No acute osseous abnormality. IMPRESSION: No active disease. Electronically Signed   By: Morgane  Naveau M.D.   On: 12/25/2023 19:53   CT Head Wo Contrast Result Date: 12/25/2023 CLINICAL DATA:  Head trauma, minor, normal mental status (Age 23-64y). Trip and fall EXAM: CT HEAD WITHOUT CONTRAST TECHNIQUE: Contiguous axial images were obtained from the base of the skull through the vertex without intravenous contrast. RADIATION DOSE REDUCTION: This exam was performed according to the departmental dose-optimization program which includes automated exposure control, adjustment of the mA and/or kV according to patient size and/or use of iterative reconstruction technique. COMPARISON:  Ct max face 12/26/14, CT  head 01/14/09 FINDINGS: Brain: Cerebral ventricle sizes are concordant with the degree of cerebral volume loss. Patchy and confluent areas of decreased attenuation are noted throughout the deep and periventricular white matter of the cerebral hemispheres bilaterally, compatible with chronic microvascular ischemic disease. Bilateral basal ganglia mineralization. No evidence of large-territorial acute infarction. No parenchymal hemorrhage. No mass lesion. No extra-axial collection. No mass effect or midline shift. No hydrocephalus. Basilar cisterns are patent. Vascular: No hyperdense vessel. Atherosclerotic calcifications are present within the cavernous internal carotid arteries. Skull: No acute fracture or focal lesion. Sinuses/Orbits: Paranasal sinuses and mastoid air cells are  clear. Bilateral lens replacement. Otherwise the orbits are unremarkable. Other: None. IMPRESSION: No acute intracranial abnormality. Electronically Signed   By: Morgane  Naveau M.D.   On: 12/25/2023 19:44   DG Knee Complete 4 Views Left Result Date: 12/25/2023 CLINICAL DATA:  Trip and fall injury with left knee swelling and pain. EXAM: LEFT KNEE - COMPLETE 4+ VIEW COMPARISON:  05/29/2021 FINDINGS: Postoperative changes with left total knee arthroplasty including patellar femoral component. There is an acute transverse periprosthetic fracture of the distal left femoral metaphysis with impaction and about 1.5 cm posterior displacement of the distal fracture fragment. Moderate left knee effusion. The proximal tibia and fibula appear intact. IMPRESSION: Left total knee arthroplasty. Acute periprosthetic fracture of the distal femoral metaphysis with posterior displacement of fracture fragments. Moderate effusion. Electronically Signed   By: Elsie Gravely M.D.   On: 12/25/2023 18:32   DG Hip Unilat W or Wo Pelvis 2-3 Views Left Result Date: 12/25/2023 CLINICAL DATA:  fall EXAM: DG HIP (WITH OR WITHOUT PELVIS) 2-3V LEFT COMPARISON:  None Available. FINDINGS: Osteopenia.No evidence of pelvic fracture or diastasis.No acute hip fracture or dislocation.Mild-to-moderate joint space loss of both hips. Degenerative disc disease in the lower lumbar spine. Soft tissues are unremarkable. IMPRESSION: No acute fracture, pelvic bone diastasis, or dislocation. Electronically Signed   By: Rogelia Myers M.D.   On: 12/25/2023 18:29    Microbiology: Results for orders placed or performed during the hospital encounter of 04/16/19  Culture, blood (routine x 2)     Status: None   Collection Time: 04/16/19  2:58 PM   Specimen: BLOOD  Result Value Ref Range Status   Specimen Description BLOOD LEFT ANTECUBITAL  Final   Special Requests   Final    BOTTLES DRAWN AEROBIC AND ANAEROBIC Blood Culture adequate volume   Culture    Final    NO GROWTH 5 DAYS Performed at [facility], [address] [place], [place] [address]    Report Status 04/21/2019 FINAL  Final  Culture, blood (routine x 2)     Status: None   Collection Time: 04/16/19  3:55 PM   Specimen: BLOOD RIGHT HAND  Result Value Ref Range Status   Specimen Description BLOOD RIGHT HAND  Final   Special Requests   Final    BOTTLES DRAWN AEROBIC AND ANAEROBIC Blood Culture results may not be optimal due to an inadequate volume of blood received in culture bottles   Culture   Final    NO GROWTH 5 DAYS Performed at [facility], [address], [place], [place] [address]    Report Status 04/21/2019 FINAL  Final    Labs: CBC: Recent Labs  Lab 12/25/23 1705 12/26/23 0329 12/27/23 0558 12/28/23 0539 12/29/23 0357 12/30/23 1028 12/31/23 0515  WBC 9.0 7.5 11.5* 9.1 8.6  --  7.4  NEUTROABS 7.3  --   --   --   --   --   --  HGB 13.9 12.7 9.9* 8.9* 8.3* 9.2* 7.7*  HCT 42.4 37.5 30.0* 27.1* 24.8* 27.8* 23.8*  MCV 103.9* 102.2* 103.4* 103.8* 103.3*  --  105.3*  PLT 153 133* 127* 116* 133*  --  164   Basic Metabolic Panel: Recent Labs  Lab 12/25/23 1705 12/27/23 0558 12/31/23 0515  NA 139 138 139  K 4.5 3.6 3.4*  CL 102 102 103  CO2 25 25 26   GLUCOSE 92 121* 107*  BUN 14 12 17   CREATININE 0.56 0.68 0.55  CALCIUM  9.3 8.2* 8.2*  MG  --   --  2.2  PHOS  --   --  3.9   Liver Function Tests: No results for input(s): AST, ALT, ALKPHOS, BILITOT, PROT, ALBUMIN in the last 168 hours. CBG: No results for input(s): GLUCAP in the last 168 hours.  Discharge time spent: less than 30 minutes.  Signed: Burnard DELENA Cunning, DO [facility] 12/31/2023

## 2023-12-31 NOTE — TOC Transition Note (Signed)
 Transition of Care [facility]) - Discharge Note   Patient Details  Name: Christina Lyons MRN: [id number] Date of Birth: Nov 29, 1940  Transition of [facility]) CM/SW Contact:  Bridget Cordella Simmonds, LCSW Phone Number: 12/31/2023, 10:56 AM   Clinical Narrative:   Pt discharging to [facility], room 111.  RN call report to [phone].  PTAR called at 1050.     Final next level of care: Skilled Nursing Facility Barriers to Discharge: Barriers Resolved   Patient Goals and CMS Choice Patient states their goals for this hospitalization and ongoing recovery are:: do what I want to do   Choice offered to / list presented to : Patient (pt requesting [facility] or [facility])      Discharge Placement              Patient chooses bed at: [facility] Patient to be transferred to facility by: ptar Name of family member notified: left message with son [name], unable to leave message with niece [name] Patient and family notified of of transfer: 12/31/23  Discharge Plan and Services Additional resources added to the After Visit Summary for   In-house Referral: Clinical Social Work   Post Acute Care Choice: Skilled Nursing Facility                               Social Drivers of Health (SDOH) Interventions SDOH Screenings   Food Insecurity: No Food Insecurity (12/26/2023)  Housing: Unknown (12/26/2023)  Transportation Needs: No Transportation Needs (12/26/2023)  Utilities: Not At Risk (12/26/2023)  Alcohol  Screen: Low Risk  (08/27/2023)  Depression (PHQ2-9): Low Risk  (08/27/2023)  Financial Resource Strain: Low Risk  (08/27/2023)  Physical Activity: Inactive (08/27/2023)  Social Connections: Moderately Isolated (12/26/2023)  Stress: No Stress Concern Present (08/27/2023)  Tobacco Use: Medium Risk (12/26/2023)  Health Literacy: Adequate Health Literacy (08/27/2023)     Readmission Risk Interventions     No data to display

## 2023-12-31 NOTE — Progress Notes (Signed)
 Pt has DC order, pt will be sent to [facility]. AVS was explained to nurse, report was given to [facility]. Pending PTAR to provide transport.

## 2024-01-02 DIAGNOSIS — M62838 Other muscle spasm: Secondary | ICD-10-CM | POA: Diagnosis not present

## 2024-01-02 DIAGNOSIS — S72002A Fracture of unspecified part of neck of left femur, initial encounter for closed fracture: Secondary | ICD-10-CM | POA: Diagnosis not present

## 2024-01-05 DIAGNOSIS — S72002A Fracture of unspecified part of neck of left femur, initial encounter for closed fracture: Secondary | ICD-10-CM | POA: Diagnosis not present

## 2024-01-05 DIAGNOSIS — R609 Edema, unspecified: Secondary | ICD-10-CM | POA: Diagnosis not present

## 2024-01-07 DIAGNOSIS — Z4789 Encounter for other orthopedic aftercare: Secondary | ICD-10-CM | POA: Diagnosis not present

## 2024-01-07 DIAGNOSIS — R2689 Other abnormalities of gait and mobility: Secondary | ICD-10-CM | POA: Diagnosis not present

## 2024-01-07 DIAGNOSIS — M6281 Muscle weakness (generalized): Secondary | ICD-10-CM | POA: Diagnosis not present

## 2024-01-07 DIAGNOSIS — F32A Depression, unspecified: Secondary | ICD-10-CM | POA: Diagnosis not present

## 2024-01-07 DIAGNOSIS — S72402D Unspecified fracture of lower end of left femur, subsequent encounter for closed fracture with routine healing: Secondary | ICD-10-CM | POA: Diagnosis not present

## 2024-01-07 DIAGNOSIS — Z9181 History of falling: Secondary | ICD-10-CM | POA: Diagnosis not present

## 2024-01-07 DIAGNOSIS — F419 Anxiety disorder, unspecified: Secondary | ICD-10-CM | POA: Diagnosis not present

## 2024-01-08 DIAGNOSIS — Z4789 Encounter for other orthopedic aftercare: Secondary | ICD-10-CM | POA: Diagnosis not present

## 2024-01-08 DIAGNOSIS — I10 Essential (primary) hypertension: Secondary | ICD-10-CM | POA: Diagnosis not present

## 2024-01-08 DIAGNOSIS — F411 Generalized anxiety disorder: Secondary | ICD-10-CM | POA: Diagnosis not present

## 2024-01-08 DIAGNOSIS — C50919 Malignant neoplasm of unspecified site of unspecified female breast: Secondary | ICD-10-CM | POA: Diagnosis not present

## 2024-01-08 DIAGNOSIS — F32A Depression, unspecified: Secondary | ICD-10-CM | POA: Diagnosis not present

## 2024-01-09 DIAGNOSIS — R609 Edema, unspecified: Secondary | ICD-10-CM | POA: Diagnosis not present

## 2024-01-09 DIAGNOSIS — F32A Depression, unspecified: Secondary | ICD-10-CM | POA: Diagnosis not present

## 2024-01-09 DIAGNOSIS — S72002A Fracture of unspecified part of neck of left femur, initial encounter for closed fracture: Secondary | ICD-10-CM | POA: Diagnosis not present

## 2024-01-09 DIAGNOSIS — E559 Vitamin D deficiency, unspecified: Secondary | ICD-10-CM | POA: Diagnosis not present

## 2024-01-14 DIAGNOSIS — Z4789 Encounter for other orthopedic aftercare: Secondary | ICD-10-CM | POA: Diagnosis not present

## 2024-01-14 DIAGNOSIS — S72402D Unspecified fracture of lower end of left femur, subsequent encounter for closed fracture with routine healing: Secondary | ICD-10-CM | POA: Diagnosis not present

## 2024-01-14 DIAGNOSIS — Z9181 History of falling: Secondary | ICD-10-CM | POA: Diagnosis not present

## 2024-01-14 DIAGNOSIS — M6281 Muscle weakness (generalized): Secondary | ICD-10-CM | POA: Diagnosis not present

## 2024-01-14 DIAGNOSIS — R2689 Other abnormalities of gait and mobility: Secondary | ICD-10-CM | POA: Diagnosis not present

## 2024-01-14 DIAGNOSIS — F419 Anxiety disorder, unspecified: Secondary | ICD-10-CM | POA: Diagnosis not present

## 2024-01-14 DIAGNOSIS — F32A Depression, unspecified: Secondary | ICD-10-CM | POA: Diagnosis not present

## 2024-01-19 DIAGNOSIS — S72452D Displaced supracondylar fracture without intracondylar extension of lower end of left femur, subsequent encounter for closed fracture with routine healing: Secondary | ICD-10-CM | POA: Diagnosis not present

## 2024-01-20 DIAGNOSIS — R609 Edema, unspecified: Secondary | ICD-10-CM | POA: Diagnosis not present

## 2024-01-20 DIAGNOSIS — S72002A Fracture of unspecified part of neck of left femur, initial encounter for closed fracture: Secondary | ICD-10-CM | POA: Diagnosis not present

## 2024-01-20 DIAGNOSIS — F411 Generalized anxiety disorder: Secondary | ICD-10-CM | POA: Diagnosis not present

## 2024-01-25 DIAGNOSIS — Z9181 History of falling: Secondary | ICD-10-CM | POA: Diagnosis not present

## 2024-01-25 DIAGNOSIS — Z96653 Presence of artificial knee joint, bilateral: Secondary | ICD-10-CM | POA: Diagnosis not present

## 2024-01-25 DIAGNOSIS — K219 Gastro-esophageal reflux disease without esophagitis: Secondary | ICD-10-CM | POA: Diagnosis not present

## 2024-01-25 DIAGNOSIS — Z170 Estrogen receptor positive status [ER+]: Secondary | ICD-10-CM | POA: Diagnosis not present

## 2024-01-25 DIAGNOSIS — M51369 Other intervertebral disc degeneration, lumbar region without mention of lumbar back pain or lower extremity pain: Secondary | ICD-10-CM | POA: Diagnosis not present

## 2024-01-25 DIAGNOSIS — I872 Venous insufficiency (chronic) (peripheral): Secondary | ICD-10-CM | POA: Diagnosis not present

## 2024-01-25 DIAGNOSIS — E440 Moderate protein-calorie malnutrition: Secondary | ICD-10-CM | POA: Diagnosis not present

## 2024-01-25 DIAGNOSIS — M4120 Other idiopathic scoliosis, site unspecified: Secondary | ICD-10-CM | POA: Diagnosis not present

## 2024-01-25 DIAGNOSIS — C50411 Malignant neoplasm of upper-outer quadrant of right female breast: Secondary | ICD-10-CM | POA: Diagnosis not present

## 2024-01-25 DIAGNOSIS — F411 Generalized anxiety disorder: Secondary | ICD-10-CM | POA: Diagnosis not present

## 2024-01-25 DIAGNOSIS — E785 Hyperlipidemia, unspecified: Secondary | ICD-10-CM | POA: Diagnosis not present

## 2024-01-25 DIAGNOSIS — I10 Essential (primary) hypertension: Secondary | ICD-10-CM | POA: Diagnosis not present

## 2024-01-26 ENCOUNTER — Telehealth: Payer: Self-pay

## 2024-01-26 NOTE — Transitions of Care (Post Inpatient/ED Visit) (Unsigned)
   01/26/2024  Name: Christina Lyons MRN: [id number] DOB: 08/20/1940  Today's TOC FU Call Status: Today's TOC FU Call Status:: Unsuccessful Call (1st Attempt) Unsuccessful Call (1st Attempt) Date: 01/26/24  Attempted to reach the patient regarding the most recent Inpatient/ED visit.  Follow Up Plan: Additional outreach attempts will be made to reach the patient to complete the Transitions of Care (Post Inpatient/ED visit) call.   Signature Julian Lemmings, LPN [facility] Nurse Health Advisor Direct Dial [phone]

## 2024-01-27 ENCOUNTER — Inpatient Hospital Stay: Admitting: Family Medicine

## 2024-01-27 ENCOUNTER — Telehealth: Payer: Self-pay | Admitting: Family Medicine

## 2024-01-27 DIAGNOSIS — M810 Age-related osteoporosis without current pathological fracture: Secondary | ICD-10-CM | POA: Diagnosis not present

## 2024-01-27 DIAGNOSIS — I10 Essential (primary) hypertension: Secondary | ICD-10-CM | POA: Diagnosis not present

## 2024-01-27 NOTE — Transitions of Care (Post Inpatient/ED Visit) (Signed)
   01/27/2024  Name: LURLEAN KERNEN MRN: [id number] DOB: Dec 08, 1940  Today's TOC FU Call Status: Today's TOC FU Call Status:: Unsuccessful Call (1st Attempt) Unsuccessful Call (1st Attempt) Date: 01/26/24  Attempted to reach the patient regarding the most recent Inpatient/ED visit.  Follow Up Plan: No further outreach attempts will be made at this time. We have been unable to contact the patient. Patient already seen in office Signature Julian Lemmings, LPN [facility] Nurse Health Advisor Direct Dial [phone]

## 2024-01-27 NOTE — Telephone Encounter (Signed)
 Copied from CRM #[id number]. Topic: Clinical - Home Health Verbal Orders >> Jan 27, 2024  9:42 AM Kevelyn M wrote: Caller/Agency: [facility] Callback Number: [phone] Service Requested: Physical Therapy Frequency: 2 week 2 1 week 7 (October 26th for strength training and balance. Home health aid 1 week 4) Any new concerns about the patient? No

## 2024-01-29 DIAGNOSIS — Z9181 History of falling: Secondary | ICD-10-CM | POA: Diagnosis not present

## 2024-01-29 DIAGNOSIS — M4120 Other idiopathic scoliosis, site unspecified: Secondary | ICD-10-CM | POA: Diagnosis not present

## 2024-01-29 DIAGNOSIS — Z170 Estrogen receptor positive status [ER+]: Secondary | ICD-10-CM | POA: Diagnosis not present

## 2024-01-29 DIAGNOSIS — I872 Venous insufficiency (chronic) (peripheral): Secondary | ICD-10-CM | POA: Diagnosis not present

## 2024-01-29 DIAGNOSIS — M80052D Age-related osteoporosis with current pathological fracture, left femur, subsequent encounter for fracture with routine healing: Secondary | ICD-10-CM | POA: Diagnosis not present

## 2024-01-29 DIAGNOSIS — I10 Essential (primary) hypertension: Secondary | ICD-10-CM | POA: Diagnosis not present

## 2024-01-29 DIAGNOSIS — K219 Gastro-esophageal reflux disease without esophagitis: Secondary | ICD-10-CM | POA: Diagnosis not present

## 2024-01-29 DIAGNOSIS — E785 Hyperlipidemia, unspecified: Secondary | ICD-10-CM | POA: Diagnosis not present

## 2024-01-29 DIAGNOSIS — Z96653 Presence of artificial knee joint, bilateral: Secondary | ICD-10-CM | POA: Diagnosis not present

## 2024-01-29 DIAGNOSIS — F411 Generalized anxiety disorder: Secondary | ICD-10-CM | POA: Diagnosis not present

## 2024-01-29 DIAGNOSIS — M51369 Other intervertebral disc degeneration, lumbar region without mention of lumbar back pain or lower extremity pain: Secondary | ICD-10-CM | POA: Diagnosis not present

## 2024-01-29 DIAGNOSIS — C50411 Malignant neoplasm of upper-outer quadrant of right female breast: Secondary | ICD-10-CM | POA: Diagnosis not present

## 2024-01-29 DIAGNOSIS — E440 Moderate protein-calorie malnutrition: Secondary | ICD-10-CM | POA: Diagnosis not present

## 2024-01-30 ENCOUNTER — Ambulatory Visit (INDEPENDENT_AMBULATORY_CARE_PROVIDER_SITE_OTHER): Payer: Medicare (Managed Care) | Admitting: Family Medicine

## 2024-01-30 ENCOUNTER — Telehealth: Payer: Self-pay

## 2024-01-30 ENCOUNTER — Other Ambulatory Visit: Payer: Self-pay | Admitting: Family Medicine

## 2024-01-30 ENCOUNTER — Encounter: Payer: Self-pay | Admitting: Family Medicine

## 2024-01-30 VITALS — BP 120/82 | HR 83 | Temp 97.8°F | Ht 60.0 in | Wt 194.0 lb

## 2024-01-30 DIAGNOSIS — Z8781 Personal history of (healed) traumatic fracture: Secondary | ICD-10-CM

## 2024-01-30 DIAGNOSIS — D649 Anemia, unspecified: Secondary | ICD-10-CM | POA: Diagnosis not present

## 2024-01-30 MED ORDER — AMOXICILLIN-POT CLAVULANATE 875-125 MG PO TABS: 1.0000 | ORAL_TABLET | Freq: Two times a day (BID) | ORAL | 0 refills | Status: AC

## 2024-01-30 MED ORDER — LORAZEPAM 1 MG PO TABS: 1.0000 mg | ORAL_TABLET | Freq: Two times a day (BID) | ORAL | 0 refills | Status: DC | PRN

## 2024-01-30 MED ORDER — HYDROCODONE-ACETAMINOPHEN 5-325 MG PO TABS: 1.0000 | ORAL_TABLET | ORAL | 0 refills | Status: DC | PRN

## 2024-01-30 NOTE — Telephone Encounter (Signed)
 Pt advised Colleen on way out of office that she needs refills on her Hydrocodone  and Lorazepam . Thank you.

## 2024-01-30 NOTE — Progress Notes (Signed)
 Subjective:    Patient ID: Christina Lyons, female    DOB: [birth year], 83 y.o.   MRN: [id number]  HPI  Admit date:     12/25/2023  Discharge date: 12/31/23  Discharge Physician: Burnard DELENA Cunning    PCP: Duanne Butler DASEN, MD    Recommendations at discharge:     Orthopedic Surgery Recommends -- -- Weightbearing as tolerated on LEFT Leg -- Range of Motion: Unrestricted  -- Incisional and dressing care: Change Mepilex dressings as needed -- Showering: Okay begin getting incisions wet starting 12/30/2023 if no drainage from incisions -- Follow-up in clinic 2 weeks after discharge from the hospital for wound check and repeat x-rays   Follow up with your Primary Care provider in 1-2 weeks Repeat CBC, CMP at follow up   Discharge Diagnoses: Principal Problem:   Closed fracture of left distal femur (HCC) Active Problems:   Hyperlipidemia   Hypertension   Malignant neoplasm of upper-outer quadrant of right breast in female, estrogen receptor positive (HCC)   Malnutrition of moderate degree    Hospital Course:   83 years old female with PMH significant for bilateral knee replacement, GERD, hypertension, hyperlipidemia, osteoporosis, right breast cancer, heart murmur presented status post mechanical fall, hit her head but denies any loss of consciousness, not on any anticoagulation. She has developed pain in the left knee. She has stopped taking her blood pressure and hyperlipidemia medication 4 months ago. Imaging confirms closed fracture of left distal femur. Patient admitted for further evaluation, orthopedics consulted , status post ORIF on 12/26/2023 with Dr. Kendal.   Further hospital course and management as outlined below.   12/31/23 AM -- pt seen and examined at bedside this AM.  She reports constipation has finally resolved with stool softeners, having good bowel movements now.  Pain is controlled adequately.  Pt denies other complaints or concerns and is medically stable for  discharge to SNF for rehab today.       Assessment and Plan:   Closed fracture of left distal femur: Status post mechanical fall: Patient presented status post mechanical fall with left leg pain. X-ray confirmed left distal femur fracture. Continue pain control per orders.  Pt reports pain control has been adequate. Orthopedics consulted.  Status post ORIF on 12/26/23 with Dr. Kendal. PT/OT  recommended Acute inpatient rehab, but declined Will discharge to SNF for rehab Weight-bearing as tolerated on LLE Follow-up with Ortho in 2 weeks in office for wound check and repeat x-ray. Authorization approved for SNF/rehab discharge today   Hyperlipidemia: Patient is not on statin. LDL at goal.   Hypertension: BP controlled off medications. Monitor BP's in follow up   Malignant neoplasm of the upper outer quadrant of right breast: On Femara   Follow-up outpatient with oncology as scheduled.   Urinary burning - resolved UA was ordered 9/29 but never collected. Pt afebrile and reports this is improved. ?Atrophic vaginitis in post-menopause. Continue to monitor in follow up.    01/30/24 Since leaving the hospital, the patient states that she has not had any cough or chest pain.  She denies any shortness of breath.  She denies any pleurisy.  She denies any dysuria urgency or frequency.  She has some mild pain around her knee that she takes hydrocodone  twice a day for.  She is walking with a walker.  Physical therapy is starting next week at her home.  She does not have any pain or swelling in her calves.  She has a negative [place]'  sign.  Postoperative course was complicated by anemia.  She is due to recheck this.  Hemoglobin was 7.7 at discharge.   Past Medical History:  Diagnosis Date  . DDD (degenerative disc disease), lumbar   . Family history of breast cancer 08/16/2020  . GERD (gastroesophageal reflux disease)   . History of bronchitis   . Hyperlipidemia   . Hypertension   .  Osteomyelitis (HCC) 1946   both legs  . Osteoporosis   . Other idiopathic scoliosis, thoracolumbar region    Past Surgical History:  Procedure Laterality Date  . ABDOMINAL HYSTERECTOMY    . BREAST LUMPECTOMY Right 09/12/2020   Procedure: RIGHT BREAST LUMPECTOMY;  Surgeon: Aron Shoulders, MD;  Location: [facility] OR;  Service: General;  Laterality: Right;  . CHOLECYSTECTOMY    . JOINT REPLACEMENT     right knee  . ORIF FEMUR FRACTURE Left 12/26/2023   Procedure: OPEN REDUCTION INTERNAL FIXATION (ORIF) DISTAL FEMUR FRACTURE, LEFT;  Surgeon: Kendal Franky SQUIBB, MD;  Location: [facility] OR;  Service: Orthopedics;  Laterality: Left;   Current Outpatient Medications on File Prior to Visit  Medication Sig Dispense Refill  . acetaminophen  (TYLENOL ) 325 MG tablet Take 1-2 tablets (325-650 mg total) by mouth every 6 (six) hours as needed for mild pain (pain score 1-3) or fever (or temp > 100.5).    . albuterol  (VENTOLIN  HFA) 108 (90 Base) MCG/ACT inhaler Inhale 2 puffs into the lungs every 6 (six) hours as needed for wheezing or shortness of breath.    SABRA alum & mag hydroxide-simeth (MAALOX/MYLANTA) 200-200-20 MG/5ML suspension Take 30 mLs by mouth every 4 (four) hours as needed for indigestion or heartburn.    SABRA apixaban (ELIQUIS) 2.5 MG TABS tablet Take 1 tablet (2.5 mg total) by mouth 2 (two) times daily. 60 tablet 0  . cholecalciferol (CHOLECALCIFEROL) 25 MCG tablet Take 4 tablets (4,000 Units total) by mouth daily.    . docusate sodium  (COLACE) 100 MG capsule Take 1 capsule (100 mg total) by mouth 2 (two) times daily.    . feeding supplement (ENSURE PLUS HIGH PROTEIN) LIQD Take 237 mLs by mouth 2 (two) times daily between meals.    . gabapentin  (NEURONTIN ) 300 MG capsule Take 300 mg by mouth 2 (two) times daily as needed (pain).    . HYDROcodone -acetaminophen  (NORCO/VICODIN) 5-325 MG tablet Take 1-2 tablets by mouth every 4 (four) hours as needed for moderate pain (pain score 4-6) or severe pain (pain score 7-10) (1  tablet pain score 4-6, 2 tablets pain score 7-10). 30 tablet 0  . letrozole  (FEMARA ) 2.5 MG tablet TAKE 1 TABLET BY MOUTH EVERY DAY 90 tablet 3  . LORazepam  (ATIVAN ) 1 MG tablet Take 1 tablet (1 mg total) by mouth 2 (two) times daily as needed for anxiety. 30 tablet 0  . methocarbamol  (ROBAXIN ) 500 MG tablet Take 1 tablet (500 mg total) by mouth every 6 (six) hours as needed for muscle spasms. 20 tablet 0  . montelukast  (SINGULAIR ) 10 MG tablet Take 1 tablet (10 mg total) by mouth at bedtime.    . Multiple Vitamin (MULTIVITAMIN WITH MINERALS) TABS tablet Take 1 tablet by mouth daily with supper.    . polyethylene glycol (MIRALAX  / GLYCOLAX ) 17 g packet Take 17 g by mouth daily.    . sennosides-docusate sodium  (SENOKOT-S) 8.6-50 MG tablet Take 1 tablet by mouth daily as needed for constipation.    . traZODone  (DESYREL ) 50 MG tablet Take 1 tablet (50 mg total) by mouth at bedtime  as needed for sleep. 90 tablet 3  . venlafaxine  XR (EFFEXOR -XR) 37.5 MG 24 hr capsule TAKE 1 CAPSULE BY MOUTH DAILY WITH BREAKFAST. 90 capsule 2   No current facility-administered medications on file prior to visit.   Allergies  Allergen Reactions  . Sulfa Antibiotics Hives   Social History   Socioeconomic History  . Marital status: Widowed    Spouse name: Not on file  . Number of children: Not on file  . Years of education: Not on file  . Highest education level: Not on file  Occupational History  . Occupation: Retired  Tobacco Use  . Smoking status: Former    Current packs/day: 0.00    Average packs/day: 0.5 packs/day for 5.0 years (2.5 ttl pk-yrs)    Types: Cigarettes    Start date: 04/02/1975    Quit date: 04/01/1980    Years since quitting: 43.8  . Smokeless tobacco: Never  Vaping Use  . Vaping status: Never Used  Substance and Sexual Activity  . Alcohol  use: No    Alcohol /week: 0.0 standard drinks of alcohol   . Drug use: No  . Sexual activity: Not on file  Other Topics Concern  . Not on file   Social History Narrative  . Not on file   Social Drivers of Health   Financial Resource Strain: Low Risk  (08/27/2023)   Overall Financial Resource Strain (CARDIA)   . Difficulty of Paying Living Expenses: Not hard at all  Food Insecurity: No Food Insecurity (12/26/2023)   Hunger Vital Sign   . Worried About Programme Researcher, Broadcasting/film/video in the Last Year: Never true   . Ran Out of Food in the Last Year: Never true  Transportation Needs: No Transportation Needs (12/26/2023)   PRAPARE - Transportation   . Lack of Transportation (Medical): No   . Lack of Transportation (Non-Medical): No  Physical Activity: Inactive (08/27/2023)   Exercise Vital Sign   . Days of Exercise per Week: 0 days   . Minutes of Exercise per Session: 0 min  Stress: No Stress Concern Present (08/27/2023)   [employer] of Occupational Health - Occupational Stress Questionnaire   . Feeling of Stress : Not at all  Social Connections: Moderately Isolated (12/26/2023)   Social Connection and Isolation Panel   . Frequency of Communication with Friends and Family: More than three times a week   . Frequency of Social Gatherings with Friends and Family: Three times a week   . Attends Religious Services: More than 4 times per year   . Active Member of Clubs or Organizations: No   . Attends Banker Meetings: Never   . Marital Status: Widowed  Intimate Partner Violence: Not At Risk (12/26/2023)   Humiliation, Afraid, Rape, and Kick questionnaire   . Fear of Current or Ex-Partner: No   . Emotionally Abused: No   . Physically Abused: No   . Sexually Abused: No      Review of Systems  All other systems reviewed and are negative.      Objective:   Physical Exam Vitals reviewed.  Constitutional:      General: She is not in acute distress.    Appearance: Normal appearance. She is normal weight. She is not ill-appearing or toxic-appearing.  HENT:     Head: Normocephalic and atraumatic.     Right Ear: Tympanic  membrane and ear canal normal.     Left Ear: Tympanic membrane and ear canal normal.     Nose: Nose  normal. No congestion or rhinorrhea.     Mouth/Throat:     Pharynx: No oropharyngeal exudate or posterior oropharyngeal erythema.  Cardiovascular:     Rate and Rhythm: Regular rhythm.     Heart sounds: Normal heart sounds.  Pulmonary:     Effort: Pulmonary effort is normal.     Breath sounds: No stridor, decreased air movement or transmitted upper airway sounds. Rales present. No decreased breath sounds, wheezing or rhonchi.  Musculoskeletal:     Left knee: Swelling present. No erythema. Decreased range of motion. Tenderness present.  Neurological:     Mental Status: She is alert.          Assessment & Plan:  Anemia, unspecified type - Plan: CBC with Differential/Platelet, Comprehensive metabolic panel with GFR, Iron, Vitamin B12  History of femur fracture Recheck CBC CMP today.  Monitor hemoglobin.  Consider starting iron supplement if iron levels are low.  Consider starting B12 supplement if B12 levels are low.  Caution the patient about using a stool softener with her pain medication.  Caution the patient about the potential risk of delirium on pain medication.  Advised her to avoid taking the pain medication and the lorazepam  together.  Patient does have some faint bibasilar crackles today on exam.  I believe this is likely atelectasis.  Encouraged her to use deep breathing exercises to help prevent pneumonia.  Discussed signs and symptoms of pneumonia and when to seek medical attention.

## 2024-01-31 DIAGNOSIS — M4120 Other idiopathic scoliosis, site unspecified: Secondary | ICD-10-CM | POA: Diagnosis not present

## 2024-01-31 DIAGNOSIS — I10 Essential (primary) hypertension: Secondary | ICD-10-CM | POA: Diagnosis not present

## 2024-01-31 DIAGNOSIS — K219 Gastro-esophageal reflux disease without esophagitis: Secondary | ICD-10-CM | POA: Diagnosis not present

## 2024-01-31 DIAGNOSIS — Z9181 History of falling: Secondary | ICD-10-CM | POA: Diagnosis not present

## 2024-01-31 DIAGNOSIS — F411 Generalized anxiety disorder: Secondary | ICD-10-CM | POA: Diagnosis not present

## 2024-01-31 DIAGNOSIS — I872 Venous insufficiency (chronic) (peripheral): Secondary | ICD-10-CM | POA: Diagnosis not present

## 2024-01-31 DIAGNOSIS — Z170 Estrogen receptor positive status [ER+]: Secondary | ICD-10-CM | POA: Diagnosis not present

## 2024-01-31 DIAGNOSIS — E785 Hyperlipidemia, unspecified: Secondary | ICD-10-CM | POA: Diagnosis not present

## 2024-01-31 DIAGNOSIS — Z96653 Presence of artificial knee joint, bilateral: Secondary | ICD-10-CM | POA: Diagnosis not present

## 2024-01-31 DIAGNOSIS — M51369 Other intervertebral disc degeneration, lumbar region without mention of lumbar back pain or lower extremity pain: Secondary | ICD-10-CM | POA: Diagnosis not present

## 2024-01-31 DIAGNOSIS — E440 Moderate protein-calorie malnutrition: Secondary | ICD-10-CM | POA: Diagnosis not present

## 2024-01-31 DIAGNOSIS — C50411 Malignant neoplasm of upper-outer quadrant of right female breast: Secondary | ICD-10-CM | POA: Diagnosis not present

## 2024-01-31 LAB — CBC WITH DIFFERENTIAL/PLATELET
Absolute Lymphocytes: 954 {cells}/uL (ref 850–3900)
Absolute Monocytes: 464 {cells}/uL (ref 200–950)
Basophils Absolute: 61 {cells}/uL (ref 0–200)
Basophils Relative: 1.2 %
Eosinophils Absolute: 148 {cells}/uL (ref 15–500)
Eosinophils Relative: 2.9 %
HCT: 39.6 % (ref 35.0–45.0)
Hemoglobin: 12.8 g/dL (ref 11.7–15.5)
MCH: 34.4 pg — ABNORMAL HIGH (ref 27.0–33.0)
MCHC: 32.3 g/dL (ref 32.0–36.0)
MCV: 106.5 fL — ABNORMAL HIGH (ref 80.0–100.0)
MPV: 10.2 fL (ref 7.5–12.5)
Monocytes Relative: 9.1 %
Neutro Abs: 3473 {cells}/uL (ref 1500–7800)
Neutrophils Relative %: 68.1 %
Platelets: 203 Thousand/uL (ref 140–400)
RBC: 3.72 Million/uL — ABNORMAL LOW (ref 3.80–5.10)
RDW: 13.4 % (ref 11.0–15.0)
Total Lymphocyte: 18.7 %
WBC: 5.1 Thousand/uL (ref 3.8–10.8)

## 2024-01-31 LAB — COMPREHENSIVE METABOLIC PANEL WITH GFR
AG Ratio: 1.9 (calc) (ref 1.0–2.5)
ALT: 8 U/L (ref 6–29)
AST: 17 U/L (ref 10–35)
Albumin: 4.2 g/dL (ref 3.6–5.1)
Alkaline phosphatase (APISO): 116 U/L (ref 37–153)
BUN/Creatinine Ratio: 40 (calc) — ABNORMAL HIGH (ref 6–22)
BUN: 17 mg/dL (ref 7–25)
CO2: 29 mmol/L (ref 20–32)
Calcium: 9.2 mg/dL (ref 8.6–10.4)
Chloride: 102 mmol/L (ref 98–110)
Creat: 0.42 mg/dL — ABNORMAL LOW (ref 0.60–0.95)
Globulin: 2.2 g/dL (ref 1.9–3.7)
Glucose, Bld: 91 mg/dL (ref 65–99)
Potassium: 4.1 mmol/L (ref 3.5–5.3)
Sodium: 141 mmol/L (ref 135–146)
Total Bilirubin: 0.7 mg/dL (ref 0.2–1.2)
Total Protein: 6.4 g/dL (ref 6.1–8.1)
eGFR: 99 mL/min/1.73m2 (ref >=60)

## 2024-01-31 LAB — IRON: Iron: 92 ug/dL (ref 45–160)

## 2024-01-31 LAB — VITAMIN B12: Vitamin B-12: 371 pg/mL (ref 200–1100)

## 2024-02-02 ENCOUNTER — Ambulatory Visit: Payer: Self-pay | Admitting: Family Medicine

## 2024-02-02 DIAGNOSIS — E440 Moderate protein-calorie malnutrition: Secondary | ICD-10-CM | POA: Diagnosis not present

## 2024-02-02 DIAGNOSIS — Z170 Estrogen receptor positive status [ER+]: Secondary | ICD-10-CM | POA: Diagnosis not present

## 2024-02-02 DIAGNOSIS — M51369 Other intervertebral disc degeneration, lumbar region without mention of lumbar back pain or lower extremity pain: Secondary | ICD-10-CM | POA: Diagnosis not present

## 2024-02-02 DIAGNOSIS — M4120 Other idiopathic scoliosis, site unspecified: Secondary | ICD-10-CM | POA: Diagnosis not present

## 2024-02-02 DIAGNOSIS — I872 Venous insufficiency (chronic) (peripheral): Secondary | ICD-10-CM | POA: Diagnosis not present

## 2024-02-02 DIAGNOSIS — K219 Gastro-esophageal reflux disease without esophagitis: Secondary | ICD-10-CM | POA: Diagnosis not present

## 2024-02-02 DIAGNOSIS — Z96653 Presence of artificial knee joint, bilateral: Secondary | ICD-10-CM | POA: Diagnosis not present

## 2024-02-02 DIAGNOSIS — E785 Hyperlipidemia, unspecified: Secondary | ICD-10-CM | POA: Diagnosis not present

## 2024-02-02 DIAGNOSIS — Z9181 History of falling: Secondary | ICD-10-CM | POA: Diagnosis not present

## 2024-02-02 DIAGNOSIS — I10 Essential (primary) hypertension: Secondary | ICD-10-CM | POA: Diagnosis not present

## 2024-02-02 DIAGNOSIS — M80052D Age-related osteoporosis with current pathological fracture, left femur, subsequent encounter for fracture with routine healing: Secondary | ICD-10-CM | POA: Diagnosis not present

## 2024-02-02 DIAGNOSIS — C50411 Malignant neoplasm of upper-outer quadrant of right female breast: Secondary | ICD-10-CM | POA: Diagnosis not present

## 2024-02-02 DIAGNOSIS — F411 Generalized anxiety disorder: Secondary | ICD-10-CM | POA: Diagnosis not present

## 2024-02-03 DIAGNOSIS — I10 Essential (primary) hypertension: Secondary | ICD-10-CM | POA: Diagnosis not present

## 2024-02-03 DIAGNOSIS — M810 Age-related osteoporosis without current pathological fracture: Secondary | ICD-10-CM | POA: Diagnosis not present

## 2024-02-04 DIAGNOSIS — Z9181 History of falling: Secondary | ICD-10-CM | POA: Diagnosis not present

## 2024-02-04 DIAGNOSIS — I872 Venous insufficiency (chronic) (peripheral): Secondary | ICD-10-CM | POA: Diagnosis not present

## 2024-02-04 DIAGNOSIS — I10 Essential (primary) hypertension: Secondary | ICD-10-CM | POA: Diagnosis not present

## 2024-02-04 DIAGNOSIS — F411 Generalized anxiety disorder: Secondary | ICD-10-CM | POA: Diagnosis not present

## 2024-02-04 DIAGNOSIS — M4120 Other idiopathic scoliosis, site unspecified: Secondary | ICD-10-CM | POA: Diagnosis not present

## 2024-02-04 DIAGNOSIS — K219 Gastro-esophageal reflux disease without esophagitis: Secondary | ICD-10-CM | POA: Diagnosis not present

## 2024-02-04 DIAGNOSIS — Z96653 Presence of artificial knee joint, bilateral: Secondary | ICD-10-CM | POA: Diagnosis not present

## 2024-02-04 DIAGNOSIS — E440 Moderate protein-calorie malnutrition: Secondary | ICD-10-CM | POA: Diagnosis not present

## 2024-02-04 DIAGNOSIS — E785 Hyperlipidemia, unspecified: Secondary | ICD-10-CM | POA: Diagnosis not present

## 2024-02-04 DIAGNOSIS — Z170 Estrogen receptor positive status [ER+]: Secondary | ICD-10-CM | POA: Diagnosis not present

## 2024-02-04 DIAGNOSIS — M80052D Age-related osteoporosis with current pathological fracture, left femur, subsequent encounter for fracture with routine healing: Secondary | ICD-10-CM | POA: Diagnosis not present

## 2024-02-04 DIAGNOSIS — M51369 Other intervertebral disc degeneration, lumbar region without mention of lumbar back pain or lower extremity pain: Secondary | ICD-10-CM | POA: Diagnosis not present

## 2024-02-04 DIAGNOSIS — C50411 Malignant neoplasm of upper-outer quadrant of right female breast: Secondary | ICD-10-CM | POA: Diagnosis not present

## 2024-02-09 DIAGNOSIS — F411 Generalized anxiety disorder: Secondary | ICD-10-CM | POA: Diagnosis not present

## 2024-02-09 DIAGNOSIS — C50411 Malignant neoplasm of upper-outer quadrant of right female breast: Secondary | ICD-10-CM | POA: Diagnosis not present

## 2024-02-09 DIAGNOSIS — E785 Hyperlipidemia, unspecified: Secondary | ICD-10-CM | POA: Diagnosis not present

## 2024-02-09 DIAGNOSIS — Z9181 History of falling: Secondary | ICD-10-CM | POA: Diagnosis not present

## 2024-02-09 DIAGNOSIS — I872 Venous insufficiency (chronic) (peripheral): Secondary | ICD-10-CM | POA: Diagnosis not present

## 2024-02-09 DIAGNOSIS — Z96653 Presence of artificial knee joint, bilateral: Secondary | ICD-10-CM | POA: Diagnosis not present

## 2024-02-09 DIAGNOSIS — M4120 Other idiopathic scoliosis, site unspecified: Secondary | ICD-10-CM | POA: Diagnosis not present

## 2024-02-09 DIAGNOSIS — Z170 Estrogen receptor positive status [ER+]: Secondary | ICD-10-CM | POA: Diagnosis not present

## 2024-02-09 DIAGNOSIS — I10 Essential (primary) hypertension: Secondary | ICD-10-CM | POA: Diagnosis not present

## 2024-02-09 DIAGNOSIS — K219 Gastro-esophageal reflux disease without esophagitis: Secondary | ICD-10-CM | POA: Diagnosis not present

## 2024-02-09 DIAGNOSIS — E440 Moderate protein-calorie malnutrition: Secondary | ICD-10-CM | POA: Diagnosis not present

## 2024-02-09 DIAGNOSIS — M80052D Age-related osteoporosis with current pathological fracture, left femur, subsequent encounter for fracture with routine healing: Secondary | ICD-10-CM | POA: Diagnosis not present

## 2024-02-09 DIAGNOSIS — M51369 Other intervertebral disc degeneration, lumbar region without mention of lumbar back pain or lower extremity pain: Secondary | ICD-10-CM | POA: Diagnosis not present

## 2024-02-10 DIAGNOSIS — C50411 Malignant neoplasm of upper-outer quadrant of right female breast: Secondary | ICD-10-CM | POA: Diagnosis not present

## 2024-02-10 DIAGNOSIS — I10 Essential (primary) hypertension: Secondary | ICD-10-CM | POA: Diagnosis not present

## 2024-02-10 DIAGNOSIS — M4120 Other idiopathic scoliosis, site unspecified: Secondary | ICD-10-CM | POA: Diagnosis not present

## 2024-02-10 DIAGNOSIS — Z9181 History of falling: Secondary | ICD-10-CM | POA: Diagnosis not present

## 2024-02-10 DIAGNOSIS — I872 Venous insufficiency (chronic) (peripheral): Secondary | ICD-10-CM | POA: Diagnosis not present

## 2024-02-10 DIAGNOSIS — M80052D Age-related osteoporosis with current pathological fracture, left femur, subsequent encounter for fracture with routine healing: Secondary | ICD-10-CM | POA: Diagnosis not present

## 2024-02-10 DIAGNOSIS — Z96653 Presence of artificial knee joint, bilateral: Secondary | ICD-10-CM | POA: Diagnosis not present

## 2024-02-10 DIAGNOSIS — F411 Generalized anxiety disorder: Secondary | ICD-10-CM | POA: Diagnosis not present

## 2024-02-10 DIAGNOSIS — Z170 Estrogen receptor positive status [ER+]: Secondary | ICD-10-CM | POA: Diagnosis not present

## 2024-02-10 DIAGNOSIS — K219 Gastro-esophageal reflux disease without esophagitis: Secondary | ICD-10-CM | POA: Diagnosis not present

## 2024-02-10 DIAGNOSIS — M51369 Other intervertebral disc degeneration, lumbar region without mention of lumbar back pain or lower extremity pain: Secondary | ICD-10-CM | POA: Diagnosis not present

## 2024-02-10 DIAGNOSIS — E440 Moderate protein-calorie malnutrition: Secondary | ICD-10-CM | POA: Diagnosis not present

## 2024-02-10 DIAGNOSIS — E785 Hyperlipidemia, unspecified: Secondary | ICD-10-CM | POA: Diagnosis not present

## 2024-02-20 DIAGNOSIS — K219 Gastro-esophageal reflux disease without esophagitis: Secondary | ICD-10-CM | POA: Diagnosis not present

## 2024-02-20 DIAGNOSIS — M4120 Other idiopathic scoliosis, site unspecified: Secondary | ICD-10-CM | POA: Diagnosis not present

## 2024-02-20 DIAGNOSIS — Z9181 History of falling: Secondary | ICD-10-CM | POA: Diagnosis not present

## 2024-02-20 DIAGNOSIS — Z96653 Presence of artificial knee joint, bilateral: Secondary | ICD-10-CM | POA: Diagnosis not present

## 2024-02-20 DIAGNOSIS — Z170 Estrogen receptor positive status [ER+]: Secondary | ICD-10-CM | POA: Diagnosis not present

## 2024-02-20 DIAGNOSIS — I872 Venous insufficiency (chronic) (peripheral): Secondary | ICD-10-CM | POA: Diagnosis not present

## 2024-02-20 DIAGNOSIS — C50411 Malignant neoplasm of upper-outer quadrant of right female breast: Secondary | ICD-10-CM | POA: Diagnosis not present

## 2024-02-20 DIAGNOSIS — M51369 Other intervertebral disc degeneration, lumbar region without mention of lumbar back pain or lower extremity pain: Secondary | ICD-10-CM | POA: Diagnosis not present

## 2024-02-20 DIAGNOSIS — F411 Generalized anxiety disorder: Secondary | ICD-10-CM | POA: Diagnosis not present

## 2024-02-20 DIAGNOSIS — E440 Moderate protein-calorie malnutrition: Secondary | ICD-10-CM | POA: Diagnosis not present

## 2024-02-20 DIAGNOSIS — I10 Essential (primary) hypertension: Secondary | ICD-10-CM | POA: Diagnosis not present

## 2024-02-20 DIAGNOSIS — E785 Hyperlipidemia, unspecified: Secondary | ICD-10-CM | POA: Diagnosis not present

## 2024-02-24 ENCOUNTER — Other Ambulatory Visit: Payer: Self-pay | Admitting: Hematology and Oncology

## 2024-02-24 DIAGNOSIS — M80052D Age-related osteoporosis with current pathological fracture, left femur, subsequent encounter for fracture with routine healing: Secondary | ICD-10-CM | POA: Diagnosis not present

## 2024-02-24 DIAGNOSIS — I10 Essential (primary) hypertension: Secondary | ICD-10-CM | POA: Diagnosis not present

## 2024-02-24 DIAGNOSIS — E785 Hyperlipidemia, unspecified: Secondary | ICD-10-CM | POA: Diagnosis not present

## 2024-02-24 DIAGNOSIS — M51369 Other intervertebral disc degeneration, lumbar region without mention of lumbar back pain or lower extremity pain: Secondary | ICD-10-CM | POA: Diagnosis not present

## 2024-02-24 DIAGNOSIS — I872 Venous insufficiency (chronic) (peripheral): Secondary | ICD-10-CM | POA: Diagnosis not present

## 2024-02-24 DIAGNOSIS — K219 Gastro-esophageal reflux disease without esophagitis: Secondary | ICD-10-CM | POA: Diagnosis not present

## 2024-02-24 DIAGNOSIS — Z96653 Presence of artificial knee joint, bilateral: Secondary | ICD-10-CM | POA: Diagnosis not present

## 2024-02-24 DIAGNOSIS — C50411 Malignant neoplasm of upper-outer quadrant of right female breast: Secondary | ICD-10-CM | POA: Diagnosis not present

## 2024-02-24 DIAGNOSIS — F411 Generalized anxiety disorder: Secondary | ICD-10-CM | POA: Diagnosis not present

## 2024-02-24 DIAGNOSIS — E440 Moderate protein-calorie malnutrition: Secondary | ICD-10-CM | POA: Diagnosis not present

## 2024-02-24 DIAGNOSIS — Z9181 History of falling: Secondary | ICD-10-CM | POA: Diagnosis not present

## 2024-02-24 DIAGNOSIS — Z170 Estrogen receptor positive status [ER+]: Secondary | ICD-10-CM | POA: Diagnosis not present

## 2024-02-24 DIAGNOSIS — M4120 Other idiopathic scoliosis, site unspecified: Secondary | ICD-10-CM | POA: Diagnosis not present

## 2024-03-01 DIAGNOSIS — E440 Moderate protein-calorie malnutrition: Secondary | ICD-10-CM | POA: Diagnosis not present

## 2024-03-01 DIAGNOSIS — E785 Hyperlipidemia, unspecified: Secondary | ICD-10-CM | POA: Diagnosis not present

## 2024-03-01 DIAGNOSIS — M51369 Other intervertebral disc degeneration, lumbar region without mention of lumbar back pain or lower extremity pain: Secondary | ICD-10-CM | POA: Diagnosis not present

## 2024-03-01 DIAGNOSIS — K219 Gastro-esophageal reflux disease without esophagitis: Secondary | ICD-10-CM | POA: Diagnosis not present

## 2024-03-01 DIAGNOSIS — C50411 Malignant neoplasm of upper-outer quadrant of right female breast: Secondary | ICD-10-CM | POA: Diagnosis not present

## 2024-03-01 DIAGNOSIS — Z170 Estrogen receptor positive status [ER+]: Secondary | ICD-10-CM | POA: Diagnosis not present

## 2024-03-01 DIAGNOSIS — I872 Venous insufficiency (chronic) (peripheral): Secondary | ICD-10-CM | POA: Diagnosis not present

## 2024-03-01 DIAGNOSIS — I10 Essential (primary) hypertension: Secondary | ICD-10-CM | POA: Diagnosis not present

## 2024-03-01 DIAGNOSIS — M80052D Age-related osteoporosis with current pathological fracture, left femur, subsequent encounter for fracture with routine healing: Secondary | ICD-10-CM | POA: Diagnosis not present

## 2024-03-01 DIAGNOSIS — Z96653 Presence of artificial knee joint, bilateral: Secondary | ICD-10-CM | POA: Diagnosis not present

## 2024-03-01 DIAGNOSIS — F411 Generalized anxiety disorder: Secondary | ICD-10-CM | POA: Diagnosis not present

## 2024-03-01 DIAGNOSIS — M4120 Other idiopathic scoliosis, site unspecified: Secondary | ICD-10-CM | POA: Diagnosis not present

## 2024-03-08 ENCOUNTER — Telehealth: Payer: Self-pay | Admitting: Family Medicine

## 2024-03-08 NOTE — Telephone Encounter (Unsigned)
 Copied from CRM [id number]. Topic: Clinical - Medication Refill >> Mar 08, 2024 12:48 PM Winona R wrote: Medication: HYDROcodone -acetaminophen  (NORCO/VICODIN) 5-325 MG tablet  LORazepam  (ATIVAN ) 1 MG tablet    Has the patient contacted their pharmacy? No (Agent: If no, request that the patient contact the pharmacy for the refill. If patient does not wish to contact the pharmacy document the reason why and proceed with request.) (Agent: If yes, when and what did the pharmacy advise?)  This is the patient's preferred pharmacy:  [id number] [place], [place] - [address] [place] [address] Phone: [phone] Fax: [phone]  Is this the correct pharmacy for this prescription? Yes If no, delete pharmacy and type the correct one.   Has the prescription been filled recently? Yes  Is the patient out of the medication? No- Has two days left   Has the patient been seen for an appointment in the last year OR does the patient have an upcoming appointment? Yes  Can we respond through MyChart? Yes  Agent: Please be advised that Rx refills may take up to 3 business days. We ask that you follow-up with your pharmacy.

## 2024-03-10 NOTE — Telephone Encounter (Signed)
 Requested medication (s) are due for refill today: yes  Requested medication (s) are on the active medication list: yes  Last refill:  01/30/24  Future visit scheduled: yes  Notes to clinic:  Unable to refill per protocol, cannot delegate.      Requested Prescriptions  Pending Prescriptions Disp Refills   LORazepam  (ATIVAN ) 1 MG tablet 30 tablet 0    Sig: Take 1 tablet (1 mg total) by mouth 2 (two) times daily as needed for anxiety.     Not Delegated - Psychiatry: Anxiolytics/Hypnotics 2 Failed - 03/10/2024 12:14 PM      Failed - This refill cannot be delegated      Failed - Urine Drug Screen completed in last 360 days      Passed - Patient is not pregnant      Passed - Valid encounter within last 6 months    Recent Outpatient Visits           1 month ago Anemia, unspecified type   [place] [facility] Duanne Butler DASEN, MD   8 months ago Enlarged lymph node in neck   [place] [facility] Duanne Butler DASEN, MD   12 months ago Bacterial sinusitis   [place] [facility] Kayla Jeoffrey RAMAN, FNP   1 year ago Murmur   [place] [facility] Pickard, Butler DASEN, MD               HYDROcodone -acetaminophen  (NORCO/VICODIN) 5-325 MG tablet 30 tablet 0    Sig: Take 1-2 tablets by mouth every 4 (four) hours as needed for moderate pain (pain score 4-6) or severe pain (pain score 7-10) (1 tablet pain score 4-6, 2 tablets pain score 7-10).     Not Delegated - Analgesics:  Opioid Agonist Combinations Failed - 03/10/2024 12:14 PM      Failed - This refill cannot be delegated      Failed - Urine Drug Screen completed in last 360 days      Passed - Valid encounter within last 3 months    Recent Outpatient Visits           1 month ago Anemia, unspecified type   [place] [facility] Medicine Duanne Butler DASEN, MD   8 months ago Enlarged lymph node in neck   [place] [facility]  Medicine Duanne Butler DASEN, MD   12 months ago Bacterial sinusitis   [place] [facility] Kayla Jeoffrey RAMAN, FNP   1 year ago Murmur   [place] [facility] Pickard, Butler DASEN, MD

## 2024-03-11 ENCOUNTER — Telehealth: Payer: Self-pay | Admitting: Family Medicine

## 2024-03-11 ENCOUNTER — Other Ambulatory Visit: Payer: Self-pay

## 2024-03-11 MED ORDER — HYDROCODONE-ACETAMINOPHEN 5-325 MG PO TABS: 1.0000 | ORAL_TABLET | ORAL | 0 refills | Status: DC | PRN

## 2024-03-11 MED ORDER — LORAZEPAM 1 MG PO TABS: 1.0000 mg | ORAL_TABLET | Freq: Two times a day (BID) | ORAL | 0 refills | Status: DC | PRN

## 2024-03-11 NOTE — Telephone Encounter (Signed)
 I spoke with pt. She states that she does not have questions. She just needs her medications refilled. Sent RF request to PCP.

## 2024-03-11 NOTE — Telephone Encounter (Signed)
 Copied from CRM [id number]. Topic: Clinical - Medication Question >> Mar 11, 2024  2:23 PM Geneva B wrote: Reason for CRM: patient has questions about her not being able to get her refill for rx  LORazepam  (ATIVAN ) 1 MG tablet  Please call pt back Mobile [phone]

## 2024-03-30 DIAGNOSIS — S72452D Displaced supracondylar fracture without intracondylar extension of lower end of left femur, subsequent encounter for closed fracture with routine healing: Secondary | ICD-10-CM | POA: Diagnosis not present

## 2024-04-09 ENCOUNTER — Ambulatory Visit (HOSPITAL_COMMUNITY)
Admission: RE | Admit: 2024-04-09 | Discharge: 2024-04-09 | Disposition: A | Payer: Medicare (Managed Care) | Source: Ambulatory Visit | Attending: Vascular Surgery

## 2024-04-09 ENCOUNTER — Other Ambulatory Visit (HOSPITAL_COMMUNITY): Payer: Self-pay | Admitting: Student

## 2024-04-09 DIAGNOSIS — R52 Pain, unspecified: Secondary | ICD-10-CM | POA: Diagnosis not present

## 2024-04-09 DIAGNOSIS — R680 Hypothermia, not associated with low environmental temperature: Secondary | ICD-10-CM | POA: Diagnosis not present

## 2024-04-12 ENCOUNTER — Other Ambulatory Visit: Payer: Self-pay | Admitting: Family Medicine

## 2024-04-12 LAB — VAS US ABI WITH/WO TBI
Left ABI: 1.41
Right ABI: 1.47

## 2024-04-12 NOTE — Telephone Encounter (Unsigned)
 Copied from CRM #[id number]. Topic: Clinical - Medication Refill >> Apr 12, 2024  2:40 PM Gattis SQUIBB wrote: Medication: Lorazepam  1 mg Hydrocodone  5/325  Has the patient contacted their pharmacy? No (Agent: If no, request that the patient contact the pharmacy for the refill. If patient does not wish to contact the pharmacy document the reason why and proceed with request.) (Agent: If yes, when and what did the pharmacy advise?)  This is the patient's preferred pharmacy:  [id number] [place], [place] - [address] [place] [address] Phone: [phone] Fax: [phone]  Is this the correct pharmacy for this prescription? Yes If no, delete pharmacy and type the correct one.   Has the prescription been filled recently? yes  Is the patient out of the medication? Yes  Has the patient been seen for an appointment in the last year OR does the patient have an upcoming appointment? Yes  Can we respond through MyChart? No  Agent: Please be advised that Rx refills may take up to 3 business days. We ask that you follow-up with your pharmacy.

## 2024-04-13 MED ORDER — LORAZEPAM 1 MG PO TABS: 1.0000 mg | ORAL_TABLET | Freq: Two times a day (BID) | ORAL | 0 refills | Status: AC | PRN

## 2024-04-13 MED ORDER — HYDROCODONE-ACETAMINOPHEN 5-325 MG PO TABS: 1.0000 | ORAL_TABLET | ORAL | 0 refills | Status: AC | PRN

## 2024-04-13 NOTE — Telephone Encounter (Signed)
 Requested medication (s) are due for refill today - yes  Requested medication (s) are on the active medication list -yes  Future visit scheduled -yes  Last refill: hydrocodone -03/11/24 #30                 Lorazepam - 03/11/24 #30  Notes to clinic: non delegated Rx  Requested Prescriptions  Pending Prescriptions Disp Refills   HYDROcodone -acetaminophen  (NORCO/VICODIN) 5-325 MG tablet 30 tablet 0    Sig: Take 1-2 tablets by mouth every 4 (four) hours as needed for moderate pain (pain score 4-6) or severe pain (pain score 7-10) (1 tablet pain score 4-6, 2 tablets pain score 7-10).     Not Delegated - Analgesics:  Opioid Agonist Combinations Failed - 04/13/2024  2:20 PM      Failed - This refill cannot be delegated      Failed - Urine Drug Screen completed in last 360 days      Passed - Valid encounter within last 3 months    Recent Outpatient Visits           2 months ago Anemia, unspecified type   [place] [facility] Duanne Butler DASEN, MD   9 months ago Enlarged lymph node in neck   [place] [facility] Duanne Butler DASEN, MD   1 year ago Bacterial sinusitis   [place] [facility] Kayla Jeoffrey RAMAN, FNP   1 year ago Murmur   [place] [facility] Pickard, Butler DASEN, MD               LORazepam  (ATIVAN ) 1 MG tablet 30 tablet 0    Sig: Take 1 tablet (1 mg total) by mouth 2 (two) times daily as needed for anxiety.     Not Delegated - Psychiatry: Anxiolytics/Hypnotics 2 Failed - 04/13/2024  2:20 PM      Failed - This refill cannot be delegated      Failed - Urine Drug Screen completed in last 360 days      Passed - Patient is not pregnant      Passed - Valid encounter within last 6 months    Recent Outpatient Visits           2 months ago Anemia, unspecified type   [place] [facility] Duanne Butler DASEN, MD   9 months ago Enlarged lymph node in neck   [facility] Duanne Butler DASEN, MD   1 year ago Bacterial sinusitis   [place] [facility] Kayla Jeoffrey RAMAN, FNP   1 year ago Murmur    [facility] Pickard, Butler DASEN, MD                 Requested Prescriptions  Pending Prescriptions Disp Refills   HYDROcodone -acetaminophen  (NORCO/VICODIN) 5-325 MG tablet 30 tablet 0    Sig: Take 1-2 tablets by mouth every 4 (four) hours as needed for moderate pain (pain score 4-6) or severe pain (pain score 7-10) (1 tablet pain score 4-6, 2 tablets pain score 7-10).     Not Delegated - Analgesics:  Opioid Agonist Combinations Failed - 04/13/2024  2:20 PM      Failed - This refill cannot be delegated      Failed - Urine Drug Screen completed in last 360 days      Passed - Valid encounter within last 3 months    Recent Outpatient Visits  2 months ago Anemia, unspecified type   [place] [facility] Duanne, Butler DASEN, MD   9 months ago Enlarged lymph node in neck   [place] [facility] Duanne, Butler DASEN, MD   1 year ago Bacterial sinusitis   [place] [facility] Medicine Kayla Jeoffrey RAMAN, FNP   1 year ago Murmur   [place] [facility] Pickard, Butler DASEN, MD               LORazepam  (ATIVAN ) 1 MG tablet 30 tablet 0    Sig: Take 1 tablet (1 mg total) by mouth 2 (two) times daily as needed for anxiety.     Not Delegated - Psychiatry: Anxiolytics/Hypnotics 2 Failed - 04/13/2024  2:20 PM      Failed - This refill cannot be delegated      Failed - Urine Drug Screen completed in last 360 days      Passed - Patient is not pregnant      Passed - Valid encounter within last 6 months    Recent Outpatient Visits           2 months ago Anemia, unspecified type   [place] [facility] Duanne Butler DASEN, MD   9 months ago Enlarged lymph node in neck   [place] [facility]  Duanne Butler DASEN, MD   1 year ago Bacterial sinusitis   [place] [facility] Kayla Jeoffrey RAMAN, FNP   1 year ago Murmur   [place] [facility] Medicine Pickard, Butler DASEN, MD

## 2024-04-28 ENCOUNTER — Telehealth: Payer: Self-pay

## 2024-04-28 NOTE — Telephone Encounter (Signed)
 Copied from CRM #[id number]. Topic: Clinical - Lab/Test Results >> Apr 27, 2024  2:15 PM Pinkey ORN wrote: Reason for CRM: Lab Results >> Apr 27, 2024  2:17 PM Pinkey ORN wrote: Patient states she was previously seen at the vascular office and they said they sent over her lab results. Patient is wanting a call to discuss those lab results.

## 2024-09-01 ENCOUNTER — Ambulatory Visit
